# Patient Record
Sex: Male | Born: 1970 | Race: White | Hispanic: No | Marital: Married | Smoking: Never smoker
Health system: Southern US, Community
[De-identification: ages and names within clinical notes are randomized; demographics above are authoritative.]

## PROBLEM LIST (undated history)

## (undated) DIAGNOSIS — N62 Hypertrophy of breast: Secondary | ICD-10-CM

## (undated) DIAGNOSIS — I10 Essential (primary) hypertension: Secondary | ICD-10-CM

## (undated) DIAGNOSIS — Z87448 Personal history of other diseases of urinary system: Secondary | ICD-10-CM

## (undated) DIAGNOSIS — L309 Dermatitis, unspecified: Secondary | ICD-10-CM

## (undated) DIAGNOSIS — N529 Male erectile dysfunction, unspecified: Secondary | ICD-10-CM

## (undated) DIAGNOSIS — E785 Hyperlipidemia, unspecified: Secondary | ICD-10-CM

## (undated) DIAGNOSIS — M109 Gout, unspecified: Secondary | ICD-10-CM

## (undated) DIAGNOSIS — R0683 Snoring: Secondary | ICD-10-CM

## (undated) DIAGNOSIS — Q613 Polycystic kidney, unspecified: Secondary | ICD-10-CM

## (undated) HISTORY — PX: NO PAST SURGERIES: SHX2092

## (undated) HISTORY — DX: Dermatitis, unspecified: L30.9

## (undated) HISTORY — DX: Hyperlipidemia, unspecified: E78.5

## (undated) HISTORY — DX: Polycystic kidney, unspecified: Q61.3

## (undated) HISTORY — DX: Hypertrophy of breast: N62

## (undated) HISTORY — DX: Personal history of other diseases of urinary system: Z87.448

## (undated) HISTORY — DX: Gout, unspecified: M10.9

## (undated) HISTORY — DX: Snoring: R06.83

## (undated) HISTORY — DX: Essential (primary) hypertension: I10

## (undated) HISTORY — DX: Male erectile dysfunction, unspecified: N52.9

---

## 2001-07-08 ENCOUNTER — Encounter: Payer: Self-pay | Admitting: Internal Medicine

## 2001-07-08 ENCOUNTER — Emergency Department (HOSPITAL_COMMUNITY): Admission: EM | Admit: 2001-07-08 | Discharge: 2001-07-08 | Payer: Self-pay | Admitting: Internal Medicine

## 2004-08-27 ENCOUNTER — Emergency Department: Payer: Self-pay | Admitting: Emergency Medicine

## 2005-11-26 ENCOUNTER — Emergency Department (HOSPITAL_COMMUNITY): Admission: EM | Admit: 2005-11-26 | Discharge: 2005-11-26 | Payer: Self-pay | Admitting: Emergency Medicine

## 2007-09-30 ENCOUNTER — Emergency Department: Payer: Self-pay | Admitting: Emergency Medicine

## 2009-04-04 ENCOUNTER — Emergency Department: Payer: Self-pay | Admitting: Emergency Medicine

## 2011-09-15 ENCOUNTER — Inpatient Hospital Stay: Payer: Self-pay | Admitting: Internal Medicine

## 2011-09-15 DIAGNOSIS — R002 Palpitations: Secondary | ICD-10-CM

## 2011-09-15 DIAGNOSIS — R0602 Shortness of breath: Secondary | ICD-10-CM

## 2011-09-15 DIAGNOSIS — R079 Chest pain, unspecified: Secondary | ICD-10-CM

## 2011-09-15 LAB — APTT: Activated PTT: 30.4 secs (ref 23.6–35.9)

## 2011-09-15 LAB — DRUG SCREEN, URINE
Amphetamines, Ur Screen: NEGATIVE (ref ?–1000)
Barbiturates, Ur Screen: NEGATIVE (ref ?–200)
Benzodiazepine, Ur Scrn: NEGATIVE (ref ?–200)
Cocaine Metabolite,Ur ~~LOC~~: NEGATIVE (ref ?–300)
MDMA (Ecstasy)Ur Screen: NEGATIVE (ref ?–500)
Opiate, Ur Screen: POSITIVE (ref ?–300)
Tricyclic, Ur Screen: NEGATIVE (ref ?–1000)

## 2011-09-15 LAB — CK TOTAL AND CKMB (NOT AT ARMC)
CK, Total: 226 U/L (ref 35–232)
CK, Total: 175 U/L (ref 35–232)
CK-MB: 2 ng/mL (ref 0.5–3.6)
CK-MB: 2.6 ng/mL (ref 0.5–3.6)

## 2011-09-15 LAB — TSH: Thyroid Stimulating Horm: 0.774 u[IU]/mL

## 2011-09-15 LAB — CBC
HCT: 46.9 % (ref 40.0–52.0)
HGB: 15.5 g/dL (ref 13.0–18.0)
MCH: 28 pg (ref 26.0–34.0)
MCHC: 33 g/dL (ref 32.0–36.0)
MCV: 85 fL (ref 80–100)
Platelet: 249 10*3/uL (ref 150–440)
RBC: 5.52 10*6/uL (ref 4.40–5.90)
RDW: 13.5 % (ref 11.5–14.5)
WBC: 13.6 10*3/uL — ABNORMAL HIGH (ref 3.8–10.6)

## 2011-09-15 LAB — COMPREHENSIVE METABOLIC PANEL
Albumin: 4.5 g/dL (ref 3.4–5.0)
Alkaline Phosphatase: 106 U/L (ref 50–136)
Anion Gap: 10 (ref 7–16)
BUN: 18 mg/dL (ref 7–18)
Bilirubin,Total: 0.7 mg/dL (ref 0.2–1.0)
Calcium, Total: 9.7 mg/dL (ref 8.5–10.1)
Chloride: 106 mmol/L (ref 98–107)
Co2: 25 mmol/L (ref 21–32)
Creatinine: 0.99 mg/dL (ref 0.60–1.30)
EGFR (African American): >60
EGFR (Non-African Amer.): >60
Glucose: 99 mg/dL (ref 65–99)
Osmolality: 283 (ref 275–301)
Potassium: 3.7 mmol/L (ref 3.5–5.1)
SGOT(AST): 36 U/L (ref 15–37)
SGPT (ALT): 47 U/L
Sodium: 141 mmol/L (ref 136–145)
Total Protein: 8.3 g/dL — ABNORMAL HIGH (ref 6.4–8.2)

## 2011-09-15 LAB — TROPONIN I
Troponin-I: 0.11 ng/mL — ABNORMAL HIGH
Troponin-I: 0.2 ng/mL — ABNORMAL HIGH

## 2011-09-15 LAB — PROTIME-INR
INR: 1
Prothrombin Time: 13.4 secs (ref 11.5–14.7)

## 2011-09-16 DIAGNOSIS — I517 Cardiomegaly: Secondary | ICD-10-CM

## 2011-09-16 DIAGNOSIS — I471 Supraventricular tachycardia: Secondary | ICD-10-CM

## 2011-09-16 LAB — CBC WITH DIFFERENTIAL/PLATELET
Basophil #: 0.1 10*3/uL (ref 0.0–0.1)
Lymphocyte #: 3 10*3/uL (ref 1.0–3.6)
MCH: 29.1 pg (ref 26.0–34.0)
MCHC: 33.8 g/dL (ref 32.0–36.0)
MCV: 86 fL (ref 80–100)
Monocyte %: 12.7 %
Neutrophil #: 3.5 10*3/uL (ref 1.4–6.5)
Neutrophil %: 44.6 %
RBC: 5.12 10*6/uL (ref 4.40–5.90)

## 2011-09-16 LAB — BASIC METABOLIC PANEL
Anion Gap: 8 (ref 7–16)
BUN: 22 mg/dL — ABNORMAL HIGH (ref 7–18)
Calcium, Total: 8.9 mg/dL (ref 8.5–10.1)
Chloride: 105 mmol/L (ref 98–107)
Co2: 30 mmol/L (ref 21–32)
EGFR (African American): >60
Osmolality: 288 (ref 275–301)
Potassium: 4 mmol/L (ref 3.5–5.1)

## 2011-09-16 LAB — LIPID PANEL
Triglycerides: 156 mg/dL (ref 0–200)
VLDL Cholesterol, Calc: 31 mg/dL (ref 5–40)

## 2011-09-16 LAB — CK TOTAL AND CKMB (NOT AT ARMC)
CK, Total: 152 U/L (ref 35–232)
CK-MB: 2.4 ng/mL (ref 0.5–3.6)

## 2014-08-06 ENCOUNTER — Ambulatory Visit
Admit: 2014-08-06 | Disposition: A | Discharge status: Home / Self Care | Payer: Self-pay | Attending: Family Medicine | Admitting: Family Medicine

## 2014-08-08 ENCOUNTER — Other Ambulatory Visit: Payer: Self-pay | Admitting: Family Medicine

## 2014-08-08 DIAGNOSIS — M549 Dorsalgia, unspecified: Secondary | ICD-10-CM

## 2014-08-10 NOTE — H&P (Signed)
PATIENT NAME:  David [name], David [name] MR#:  [id number] DATE OF BIRTH:  [birth year]  DATE OF ADMISSION:  09/15/2011  PRIMARY CARE PHYSICIAN: [facility]   CHIEF COMPLAINT: Chest pain, palpitations.   HISTORY OF PRESENT ILLNESS: The patient is a 44 year old white male with history of hypertension, no other history, who was at work today and started having left-sided chest pain associated with palpitations along with shortness of breath. EMS was called. When they brought him they noticed he was in supraventricular tachycardia. The patient came to the ED and had to receive initially 6 mg of adenosine followed by another 12 mg of adenosine as well as metoprolol IV. He subsequently converted from likely supraventricular tachycardia to normal sinus rhythm. He was seen by cardiology, Dr. Fletcher Anon. The patient reports that currently his chest pain is resolved. He describes it as a sharp type of pain on the left side of his chest when his heart started beating fast. It now has resolved. Currently he complains of a headache. He denies any previous history of having chest pain such as this in the past. He denies any palpitations. No syncope. He denies any dyspnea on exertion. No lower extremity swelling.   PAST MEDICAL HISTORY:  1. Hypertension.  2. Gastroesophageal reflux disease.   PAST SURGICAL HISTORY: None.   ALLERGIES: None.   CURRENT MEDICATIONS:  1. Probiotic daily.  2. Multivitamin 1 tab p.o. daily.  3. Hydrochlorothiazide/lisinopril 25/20 daily.  4. Dexilant 30 mg daily.  5. Aspirin 81 mg 1 tab p.o. daily.   SOCIAL HISTORY: Does not smoke. Does not drink. No drugs.   FAMILY HISTORY: Father had heart disease since age 55 with chronic palpitations. According to his mother he died in his 54s. Exact nature of his heart disease is not known.     REVIEW OF SYSTEMS: CONSTITUTIONAL: Denies any fevers. Currently complains of fatigue, weakness. No current pain except headache. No weight loss. No  weight gain. EYES: No blurred or double vision. No redness. No inflammation. No glaucoma. No cataracts. ENT: No tinnitus. No ear pain. No hearing loss. No seasonal or year-round allergies. RESPIRATORY: No cough. No wheezing. No hemoptysis. No chronic obstructive pulmonary disease. No tuberculosis. CARDIOVASCULAR: Chest pain as above. No orthopnea. No edema.  Arrhythmia as above. No syncope. Has high blood pressure. GI: No nausea, vomiting, or diarrhea. No abdominal pain. No hematemesis. No melena. No gastroesophageal reflux disease. No irritable bowel syndrome. No jaundice. GU: Denies any dysuria, hematuria, renal calculus, or frequency. ENDO: Denies any polyuria, nocturia, or thyroid problems. HEME/LYMPH: Denies anemia, easy bruisability, or bleeding. SKIN: No acne. No rash. No change in mole, hair, or skin.  MUSCULOSKELETAL: No pain in the neck, back, or shoulder. NEURO: No numbness. No cerebrovascular accident. No transient ischemic attack. PSYCHIATRIC: No anxiety. No insomnia. No ADD. No OCD.   PHYSICAL EXAMINATION:  VITAL SIGNS: Temperature 95.8, pulse currently 74, respirations 18, blood pressure 122/60,  O2 98%.   GENERAL: The patient is a well-developed, well-nourished Caucasian male in no acute distress.   HEENT: Head atraumatic, normocephalic. Pupils equally round, reactive to light and accommodation. Extraocular movements intact. There is no conjunctival pallor. No scleral icterus. Nasal exam shows no drainage or ulceration.  Oropharynx is clear without any exudates.   NECK: No thyromegaly. No carotid bruits.   CARDIOVASCULAR: Regular rate and rhythm. No murmurs, rubs, clicks, or gallops. PMI is not displaced.   LUNGS: Clear to auscultation bilaterally without any rales, rhonchi, or wheezing.   ABDOMEN: Soft,  nontender, nondistended. Positive bowel sounds times four.   EXTREMITIES: No clubbing, cyanosis, or edema.   SKIN: No rash.   VASCULAR: Good DP, PT pulses.   NEUROLOGICAL:  Awake, alert, oriented times three. No focal deficits.   PSYCHIATRIC: Not anxious or depressed.   LABORATORY, DIAGNOSTIC, AND RADIOLOGICAL DATA: Glucose 99, BUN 18, creatinine 0.99, sodium 141, potassium 3.7, chloride 106, CO2 25. His total protein is 8.3. Albumin 4.5, bilirubin total 0.7. Troponin 0.11. CK-MB is 2. WBC 13.6, hemoglobin 15.4, platelet count 249. Chest x-ray shows no acute abnormality.   ASSESSMENT AND PLAN: The patient is a 44 year old white male with history of hypertension who presents with acute onset of chest pain and palpitations at work noted to be in supraventricular tachycardia. He received adenosine and has been seen by cardiology here. 1. Chest pain, likely related supraventricular tachycardia, now resolved: Check serial enzymes. We will place him on aspirin. Per cardiology they will follow his enzymes.  If his troponin goes up then he may need cath. Otherwise he will need to get a stress test. Cardiology to decide on either one. We will also check a fasting lipid panel in the a.m.  2. Supraventricular tachycardia: Monitor on telemetry. I am going to go ahead and place him on p.o. metoprolol.  3. Hypertension: We will hold lisinopril, hydrochlorothiazide for now. Instead I will metoprolol in light of his supraventricular tachycardia.  4. Miscellaneous: We will place him on Lovenox for deep vein thrombosis prophylaxis.    TIME SPENT: 35 minutes spent.  ____________________________ Lafonda Mosses. Posey Pronto, MD shp:bjt D: 09/15/2011 16:01:16 ET T: 09/15/2011 16:51:18 ET JOB#: [id number]  cc: Saprina Chuong H. Posey Pronto, MD, <Dictator> [place] MD ELECTRONICALLY SIGNED 09/16/2011 14:02

## 2014-08-10 NOTE — Consult Note (Signed)
Brief Consult Note: Diagnosis: SVT, chest pain with ST depression.   Patient was seen by consultant.   Discussed with Attending MD.   Comments: sudden onset of chest pain ,dyspnea and palpitations.  ECG showed possible SVT. He was very uncomfortable with dyspnea.  He was given 6 mg of IV Adenosine but did not respond. 12 mg of IV Adenosine was given. He converted to sinus tachycardia with rate of 110. Metoporlol 5 mg IV was given.  Recommend: Admit to tele, check labs and serial cardiac enzymes.  Echo.  Stress test vs cardiac cath depending on labs results.  Electronic Signatures: Kathlyn Sacramento (MD)  (Signed [phone] 13:57)  Authored: Brief Consult Note   Last Updated: 30-May-13 13:57 by Kathlyn Sacramento (MD)

## 2014-08-10 NOTE — Discharge Summary (Signed)
PATIENT NAME:  David [name], David [name] MR#:  [id number] DATE OF BIRTH:  Aug 10, 1970  DATE OF ADMISSION:  09/15/2011 DATE OF DISCHARGE:  09/16/2011  ADMITTING PHYSICIAN: Dustin Flock, MD   DISCHARGING PHYSICIAN: Gladstone Lighter, MD   PRIMARY MD: [place]: Cardiology consultation by Dr. Fletcher Anon    DISCHARGE DIAGNOSES:  1. Chest pain, likely angina.  2. Supraventricular tachycardia, converted back to normal sinus rhythm to sinus bradycardia.  3. Elevated troponin secondary to demand ischemia.  4. Hypertension.  5. Gastroesophageal reflux disease.   DISCHARGE HOME MEDICATIONS: 1. Metoprolol 25 mg p.o. b.i.d.  2. Probiotic oral capsule daily.  3. Multivitamin 1 tablet p.o. daily.  4. Aspirin 81 mg p.o. daily.  5. Dexilant 30 mg p.o. daily.   DISCHARGE DIET: Low sodium diet.   DISCHARGE ACTIVITY: As tolerated.    FOLLOW-UP INSTRUCTIONS:  1. PCP follow-up in 1 to 2 weeks.  2. Follow-up with Dr. Fletcher Anon in two weeks.   LABS AND IMAGING STUDIES PRIOR TO DISCHARGE: WBC 7.9, hemoglobin 14.9, hematocrit 44.1, platelet count 210, sodium 143, potassium 4.0, chloride 105, bicarb 30, BUN 22, creatinine 1.09, glucose 89, calcium 8.9. LDL 88, HDL 27, total cholesterol 146, triglycerides 156. Troponin 0.12. Urine tox screen positive for opiates while in the hospital.   Chest x-ray on admission showing clear lung fields. No acute cardiothoracic changes.   ALT 47, AST 36, alkaline phosphatase 106, total bilirubin 0.7, albumin 4.5. INR is 1.0. D-dimer is less than 0.22. TSH 0.774.   Echo Doppler showing normal LV systolic function, EF greater than 55%.   Exercise Myoview showed normal study. No evidence of ischemia or infarct. Normal LV systolic function greater than 73%.   BRIEF HOSPITAL COURSE: [name] is a 44 year old male with history of hypertension who was at work and presented with severe left-sided chest pain of sudden onset associated with  palpitations and dyspnea. He was found to be in supraventricular tachycardia on admission and received 6 mg of adenosine followed by another 12 mg of adenosine followed by IV metoprolol. He subsequently converted to normal sinus rhythm and was admitted for his chest pain to rule out ischemic causes. He did receive an oral dose of 50 mg of metoprolol the night of admission and remained sinus brady with heart rate in the upper 40's to 50's. Dr. Fletcher Anon has seen the patient while in the hospital and suggested since the cardiac enzymes are steady and trending down probably secondary to demand ischemia and elevated heart rate. He suggested a stress test. If the cardiac enzymes were doubling then probably cardiac cath would have been done. However, the patient remained chest pain free while in the hospital and had an exercise stress test done which was negative for any infarct or ischemia. His metoprolol dose has been decreased to 25 b.i.d. for sinus bradycardia developed while in the hospital and he will follow-up with Dr. Fletcher Anon as an outpatient. If there is a recurrence of supraventricular tachycardia, Dr. Fletcher Anon recommended ablation because of his younger age. He was on a different blood pressure medication prior to admission which is being stopped as his blood pressure is well controlled with metoprolol alone at this time.   DISCHARGE CONDITION: Stable.   DISCHARGE DISPOSITION: Home.      TIME SPENT ON DISCHARGE: 45 minutes. His course has been otherwise uneventful in the hospital.   ____________________________ Gladstone Lighter, MD rk:drc D: 09/19/2011 10:46:42 ET T: 09/19/2011 11:26:45 ET JOB#: [id number]  cc: Gladstone Lighter, MD, <Dictator> [facility] A. Fletcher Anon, MD Gladstone Lighter MD ELECTRONICALLY SIGNED 09/20/2011 19:30

## 2014-08-10 NOTE — Consult Note (Signed)
General Aspect The patient is a 44 year old white male with history of hypertension, no other history, who was at work today and started having left-sided chest pain associated with palpitations along with shortness of breath. EMS was called. When they brought him he was in significant distress with significant dyspnea, dizziness and tachycardia. His initial heart rate was 135 beats per minute. I was consulted by Dr. Thomasene Lot due to tachycardia and ST depression on his ECG. When I went to see him, his heart rate was 155 beats per minute. It was highly suspicious for supraventricular tachycardia. He was connected to the ECG machine and given initially 6 mg of adenosine but did not respond to that. He was then given 12 mg and converted to sinus rhythm. He was given IV metoprolol 5 mg as well. He denies any previous similar symptoms. he had significant chest tightness with the episode. He had previous chest pain in the past with family history of coronary artery disease. He was seen by Dr. Humphrey Rolls last year and according to the patient he had a stress test which was unremarkable. He was told about possible arrhythmia but the details are not available.   Physical Exam:   GEN no acute distress    NECK supple    RESP normal resp effort  clear BS    CARD Regular rate and rhythm  No murmur    ABD denies tenderness    LYMPH negative neck    EXTR negative cyanosis/clubbing    SKIN normal to palpation    NEURO cranial nerves intact    PSYCH alert, A+O to time, place, person   Review of Systems:   Subjective/Chief Complaint palpitations, dyspnea and chest pain    General: No Complaints    Skin: No Complaints    ENT: No Complaints    Eyes: No Complaints    Neck: No Complaints    Respiratory: Short of breath    Cardiovascular: Chest pain or discomfort  Tightness  Palpitations  Dyspnea    Vascular: No Complaints    Musculoskeletal: No Complaints    Neurologic: No Complaints     Hematologic: No Complaints    Endocrine: No Complaints    Psychiatric: No Complaints    Review of Systems: All other systems were reviewed and found to be negative   Home Medications: Medication Instructions Status  hydrochlorothiazide-lisinopril 25 mg-20 mg oral tablet 1 tab(s) orally once a day Active  Dexilant 30 mg oral delayed release capsule 1 cap(s) orally once a day Active  aspirin 81 mg oral tablet 1 tab(s) orally once a day Active  multivitamin 1 tab(s) orally once a day Active  Probiotic Formula oral capsule 1 cap(s) orally once a day Active   Cardiac:  30-May-13 13:17    Troponin I 0.11   CK, Total 226   CPK-MB, Serum 2.0  Routine Hem:  30-May-13 13:17    WBC (CBC) 13.6   RBC (CBC) 5.52   Hemoglobin (CBC) 15.5   Hematocrit (CBC) 46.9   Platelet Count (CBC) 249   MCV 85   MCH 28.0   MCHC 33.0   RDW 13.5  Routine Chem:  30-May-13 13:17    Glucose, Serum 99   BUN 18   Creatinine (comp) 0.99   Sodium, Serum 141   Potassium, Serum 3.7   Chloride, Serum 106   CO2, Serum 25   Calcium (Total), Serum 9.7  Hepatic:  30-May-13 13:17    Bilirubin, Total 0.7   Alkaline  Phosphatase 106   SGPT (ALT) 47   SGOT (AST) 36   Total Protein, Serum 8.3   Albumin, Serum 4.5  Routine Chem:  30-May-13 13:17    Osmolality (calc) 283   eGFR (African American) >60   eGFR (Non-African American) >60   Anion Gap 10  Routine Coag:  30-May-13 13:17    Prothrombin 13.4   INR 1.0   Activated PTT (APTT) 30.4   D-Dimer, Quantitative < 0.22  Thyroid:  30-May-13 13:17    Thyroid Stimulating Hormone 0.774   EKG:   EKG Interp. by me    Abnormal RBBB  supraventricular tachycardia. Minor ST depression in the inferior leads   Radiology Results: XRay:    30-May-13 13:23, Chest Portable Single View   Chest Portable Single View    REASON FOR EXAM:    Chest Pain  COMMENTS:       PROCEDURE: DXR - DXR PORTABLE CHEST SINGLE VIEW  - Sep 15 2011  1:23PM     RESULT: The  inspiratory level is less than optimal. There is haziness at   both lung bases compatible with the lack of deep inspiration. No   pneumonia, pneumothorax or pleural effusion is seen. The heart size is   normal. Monitoring electrodes are present.    IMPRESSION: No acute changes are identified.    Dictation Site: 2     Thank you for this opportunity to contribute tothe care of your patient.     Verified By: Dionne Ano WALL, M.D., MD    No Known Allergies:   Vital Signs/Nurse's Notes: **Vital Signs.:   30-May-13 17:36   Vital Signs Type Admission   Temperature Temperature (F) 98.8   Celsius 37.1   Temperature Source oral   Pulse Pulse 72   Pulse source per Dinamap   Respirations Respirations 20   Systolic BP Systolic BP 168   Diastolic BP (mmHg) Diastolic BP (mmHg) 60   Mean BP 78   BP Source vital sign device   Pulse Ox % Pulse Ox % 98   Pulse Ox Activity Level  At rest   Oxygen Delivery 2L     Impression 1. paroxysmal supraventricular tachycardia: He converted to normal sinus rhythm with IV adenosine. I recommend oral metoprolol. It appears that this is his first episode. I agree with checking thyroid function and urine toxicology screen. I will request an echocardiogram.  Given his young age, an ablation can be considered if he develops recurrent episodes.  2. Chest pain with ST depression: Likely supply demand ischemia. However, the patient does have family history of coronary artery disease and hypertension. Continue serial cardiac enzymes. Treat with aspirin for now. If his cardiac enzymes trend up, will proceed with cardiac catheterization. However, if that's not the case, a cardiac stress test will be performed.   Electronic Signatures: Kathlyn Sacramento (MD)  (Signed [phone] 19:41)  Authored: General Aspect/Present Illness, History and Physical Exam, Review of System, Home Medications, Labs, EKG , Radiology, Allergies, Vital Signs/Nurse's Notes, Impression/Plan   Last  Updated: 30-May-13 19:41 by Kathlyn Sacramento (MD)

## 2014-08-18 ENCOUNTER — Ambulatory Visit
Admission: RE | Admit: 2014-08-18 | Discharge: 2014-08-18 | Disposition: A | Discharge status: Home / Self Care | Payer: BLUE CROSS/BLUE SHIELD | Source: Ambulatory Visit | Attending: Family Medicine | Admitting: Family Medicine

## 2014-08-18 DIAGNOSIS — M5126 Other intervertebral disc displacement, lumbar region: Secondary | ICD-10-CM | POA: Insufficient documentation

## 2014-08-18 DIAGNOSIS — M549 Dorsalgia, unspecified: Secondary | ICD-10-CM

## 2014-08-18 DIAGNOSIS — M5386 Other specified dorsopathies, lumbar region: Secondary | ICD-10-CM | POA: Diagnosis not present

## 2014-08-18 DIAGNOSIS — M545 Low back pain: Secondary | ICD-10-CM | POA: Diagnosis present

## 2014-10-17 ENCOUNTER — Other Ambulatory Visit: Payer: Self-pay | Admitting: Family Medicine

## 2014-10-17 NOTE — Telephone Encounter (Signed)
Pts wife called in and says pharmacy sent over fax twice and yet medication has not been refilled. Pts wife feels as if the pharmacy wasn't moving fast enough and would like for [place] to send over refill over to the pharmacy if at all possible.    Losartan potassium 100mg  to [facility] on [address].

## 2014-10-21 MED ORDER — LOSARTAN POTASSIUM 100 MG PO TABS: 100.0000 mg | ORAL_TABLET | Freq: Every day | ORAL | Status: DC

## 2014-10-21 NOTE — Telephone Encounter (Signed)
Patient notified, appt scheduled.

## 2014-10-21 NOTE — Telephone Encounter (Signed)
Please contact wife (if she is in fact approved on his release of information form, I can't find it, FYI is blank) Let her know I'm worried that he's not being compliant with his visits and medicine I prescribed 30 days of losartan in April and he should have run out of it in late May (it's now July); he isn't taking this regularly it appears Also, he was seen in mid-May and a new medicine was started and he was supposed to come back two weeks later (end of May) We would like to see him to recheck his BP but we'd encourage them to come up with a way for him to remember to take his pills every single day Adjusting medicine and getting blood pressure under control is especially difficult with sporadic dosing Please schedule him to come in to be seen in the next 2 weeks and have him bring in all his pill bottles We are not fussing; we are here to help and problem-solve and get his blood pressure controlled Thank you

## 2014-10-31 DIAGNOSIS — M109 Gout, unspecified: Secondary | ICD-10-CM | POA: Insufficient documentation

## 2014-10-31 DIAGNOSIS — E785 Hyperlipidemia, unspecified: Secondary | ICD-10-CM | POA: Insufficient documentation

## 2014-10-31 DIAGNOSIS — I10 Essential (primary) hypertension: Secondary | ICD-10-CM | POA: Insufficient documentation

## 2014-10-31 DIAGNOSIS — R0683 Snoring: Secondary | ICD-10-CM

## 2014-10-31 DIAGNOSIS — L309 Dermatitis, unspecified: Secondary | ICD-10-CM | POA: Insufficient documentation

## 2014-10-31 DIAGNOSIS — N62 Hypertrophy of breast: Secondary | ICD-10-CM | POA: Insufficient documentation

## 2014-11-04 ENCOUNTER — Ambulatory Visit (INDEPENDENT_AMBULATORY_CARE_PROVIDER_SITE_OTHER): Payer: BLUE CROSS/BLUE SHIELD | Admitting: Family Medicine

## 2014-11-04 ENCOUNTER — Encounter: Payer: Self-pay | Admitting: Family Medicine

## 2014-11-04 VITALS — BP 144/94 | HR 63 | Temp 97.5°F | Wt 217.0 lb

## 2014-11-04 DIAGNOSIS — I10 Essential (primary) hypertension: Secondary | ICD-10-CM | POA: Diagnosis not present

## 2014-11-04 DIAGNOSIS — E786 Lipoprotein deficiency: Secondary | ICD-10-CM

## 2014-11-04 DIAGNOSIS — M109 Gout, unspecified: Secondary | ICD-10-CM

## 2014-11-04 DIAGNOSIS — E785 Hyperlipidemia, unspecified: Secondary | ICD-10-CM

## 2014-11-04 MED ORDER — METOPROLOL SUCCINATE ER 25 MG PO TB24: 25.0000 mg | ORAL_TABLET | Freq: Every day | ORAL | Status: DC

## 2014-11-04 MED ORDER — METOPROLOL SUCCINATE ER 25 MG PO TB24: 37.5000 mg | ORAL_TABLET | Freq: Every day | ORAL | Status: DC

## 2014-11-04 MED ORDER — AMLODIPINE BESYLATE 10 MG PO TABS: 10.0000 mg | ORAL_TABLET | Freq: Every day | ORAL | Status: DC

## 2014-11-04 MED ORDER — LOSARTAN POTASSIUM 100 MG PO TABS: 100.0000 mg | ORAL_TABLET | Freq: Every day | ORAL | Status: DC

## 2014-11-04 NOTE — Patient Instructions (Addendum)
Do NOT use salt substitutes; if you have to use anything at all, use just a little bit of the real thing Salt substitutes plus your losartan can cause a dangerous build-up of potassium in the body Limit the processed pork and saturated Try turmeric as a natural anti-inflammatory (for pain and arthritis). It comes in capsules where you buy aspirin and fish oil, but also as a spice where you buy pepper and garlic powder. If you need something for aches or pains, try to use Tylenol (acetaminphen) instead of non-steroidals (which include Aleve, ibuprofen, Advil, Motrin, and naproxen); non-steroidals can cause long-term kidney damage Okay to have one glass of wine or beer every day; modest alcohol does help blood pressure; too much alcohol can actually be harmful; do not drink more than 14 drinks per week Try to use PLAIN allergy medicine without the decongestant Avoid: phenylephrine, phenylpropanolamine, and pseudoephredine Try to lose five more pounds Continue your pills at the same dose and I am hopeful that with stopping the ibuprofen and losing five more pounds that you'll get to goal (under 140/90) Monitor your blood pressure and home a few days per week Return to see Amy for BP check and labs in 3 weeks  DASH Eating Plan DASH stands for "Dietary Approaches to Stop Hypertension." The DASH eating plan is a healthy eating plan that has been shown to reduce high blood pressure (hypertension). Additional health benefits may include reducing the risk of type 2 diabetes mellitus, heart disease, and stroke. The DASH eating plan may also help with weight loss. WHAT DO I NEED TO KNOW ABOUT THE DASH EATING PLAN? For the DASH eating plan, you will follow these general guidelines:  Choose foods with a percent daily value for sodium of less than 5% (as listed on the food label).  Use salt-free seasonings or herbs instead of table salt or sea salt.  Check with your health care provider or pharmacist before  using salt substitutes.  Eat lower-sodium products, often labeled as "lower sodium" or "no salt added."  Eat fresh foods.  Eat more vegetables, fruits, and low-fat dairy products.  Choose whole grains. Look for the word "whole" as the first word in the ingredient list.  Choose fish and skinless chicken or [place] more often than red meat. Limit fish, poultry, and meat to 6 oz (170 g) each day.  Limit sweets, desserts, sugars, and sugary drinks.  Choose heart-healthy fats.  Limit cheese to 1 oz (28 g) per day.  Eat more home-cooked food and less restaurant, buffet, and fast food.  Limit fried foods.  Cook foods using methods other than frying.  Limit canned vegetables. If you do use them, rinse them well to decrease the sodium.  When eating at a restaurant, ask that your food be prepared with less salt, or no salt if possible. WHAT FOODS CAN I EAT? Seek help from a dietitian for individual calorie needs. Grains Whole grain or whole wheat bread. Brown rice. Whole grain or whole wheat pasta. Quinoa, bulgur, and whole grain cereals. Low-sodium cereals. Corn or whole wheat flour tortillas. Whole grain cornbread. Whole grain crackers. Low-sodium crackers. Vegetables Fresh or frozen vegetables (raw, steamed, roasted, or grilled). Low-sodium or reduced-sodium tomato and vegetable juices. Low-sodium or reduced-sodium tomato sauce and paste. Low-sodium or reduced-sodium canned vegetables.  Fruits All fresh, canned (in natural juice), or frozen fruits. Meat and Other Protein Products Ground beef (85% or leaner), grass-fed beef, or beef trimmed of fat. Skinless chicken or [place]. Ground  chicken or [place]. Pork trimmed of fat. All fish and seafood. Eggs. Dried beans, peas, or lentils. Unsalted nuts and seeds. Unsalted canned beans. Dairy Low-fat dairy products, such as skim or 1% milk, 2% or reduced-fat cheeses, low-fat ricotta or cottage cheese, or plain low-fat yogurt. Low-sodium or  reduced-sodium cheeses. Fats and Oils Tub margarines without trans fats. Light or reduced-fat mayonnaise and salad dressings (reduced sodium). Avocado. Safflower, olive, or canola oils. Natural peanut or almond butter. Other Unsalted popcorn and pretzels. The items listed above may not be a complete list of recommended foods or beverages. Contact your dietitian for more options. WHAT FOODS ARE NOT RECOMMENDED? Grains White bread. White pasta. White rice. Refined cornbread. Bagels and croissants. Crackers that contain trans fat. Vegetables Creamed or fried vegetables. Vegetables in a cheese sauce. Regular canned vegetables. Regular canned tomato sauce and paste. Regular tomato and vegetable juices. Fruits Dried fruits. Canned fruit in light or heavy syrup. Fruit juice. Meat and Other Protein Products Fatty cuts of meat. Ribs, chicken wings, bacon, sausage, bologna, salami, chitterlings, fatback, hot dogs, bratwurst, and packaged luncheon meats. Salted nuts and seeds. Canned beans with salt. Dairy Whole or 2% milk, cream, half-and-half, and cream cheese. Whole-fat or sweetened yogurt. Full-fat cheeses or blue cheese. Nondairy creamers and whipped toppings. Processed cheese, cheese spreads, or cheese curds. Condiments Onion and garlic salt, seasoned salt, table salt, and sea salt. Canned and packaged gravies. Worcestershire sauce. Tartar sauce. Barbecue sauce. Teriyaki sauce. Soy sauce, including reduced sodium. Steak sauce. Fish sauce. Oyster sauce. Cocktail sauce. Horseradish. Ketchup and mustard. Meat flavorings and tenderizers. Bouillon cubes. Hot sauce. Tabasco sauce. Marinades. Taco seasonings. Relishes. Fats and Oils Butter, stick margarine, lard, shortening, ghee, and bacon fat. Coconut, palm kernel, or palm oils. Regular salad dressings. Other Pickles and olives. Salted popcorn and pretzels. The items listed above may not be a complete list of foods and beverages to avoid. Contact your  dietitian for more information. WHERE CAN I FIND MORE INFORMATION? National Heart, Lung, and Blood Institute: travelstabloid.com Document Released: 03/24/2011 Document Revised: 08/19/2013 Document Reviewed: 02/06/2013 [facility] Patient Information 2015 [place], [place]. This information is not intended to replace advice given to you by your health care provider. Make sure you discuss any questions you have with your health care provider.

## 2014-11-04 NOTE — Assessment & Plan Note (Signed)
We're avoiding HCTZ because of his gout

## 2014-11-04 NOTE — Assessment & Plan Note (Signed)
Recheck lipids at f/u in a few weeks

## 2014-11-04 NOTE — Progress Notes (Signed)
BP 144/94 mmHg  Pulse 63  Temp(Src) 97.5 F (36.4 C)  Wt 217 lb (98.431 kg)  SpO2 98%   Subjective:    Patient ID: David [name], male    DOB: 1971-03-12, 44 y.o.   MRN: [id number]  HPI: David [name] is a 44 y.o. male  Chief Complaint  Patient presents with  . Hypertension   Hypertension Checking blood pressure away from here?  yes Range (low to high) over last two weeks:  140s over 90s Ran out of the third pill a while ago, had been in a day timer; crazy time of year  Hypertension-associated complications:  none  If taking medicines, are you taking them regularly?  NO but ran out  Siblings / family history: Does high blood pressure run in your family?   YES  Salt:  Trying to limit sodium / salt when buying foods at the grocery store?  yes, wife does the shopping Do you try to limit added salt when cooking and at the table?  yes  Sweets/licorice:  Do you eat a lot of sweets or eat black licorice?  no  Saturated fats: Do you eat a lot of foods like bacon, sausage, pepperoni, cheeseburgers, hot dogs, bologna, and cheese?  YES  Sedentary lifestyle:  Exercise/activity level:  frequent (three or more times per week)  Steroids/Non-steroidals:  Have you had a recent cortisone shot in the last few months?  no  Do you take prednisone or prescription NSAIDs or take OTCS NSAIDs such as ibuprofen, Motrin, Advil, Aleve, or naproxen? YES taking 800 mg of ibuprofen every morning and some later too  Smoking: Do you smoke?  no  Snoring / sleep apnea: Do you snore or have sleep apnea?  YES, he snores; he had tests for sleep apnea and he slept "good"  Stress: Do you feel like you are under excessive stress or that your stress level affects your blood pressure at times?    YES; he has been trying to be less stressed  Stroh's (alcohol): Do you drink alcohol  yes  --- if yes, do you drink more than 14 drinks/week if male or more than 7 drinks/week if male?  no  Sudafed  (decongestants): Do you use any OTC decongestant products like Allegra-D, Claritin-D, Zyrtec-D, Tylenol Cold and Sinus, etc.?  YES  He has high cholesterol; eats typical [place] farm breakfast on the weekends, does try to have cereal during the week  Relevant past medical, surgical, family and social history reviewed and updated as indicated. Interim medical history since our last visit reviewed. Allergies and medications reviewed and updated.  Review of Systems Per HPI unless specifically indicated above     Objective:    BP 144/94 mmHg  Pulse 63  Temp(Src) 97.5 F (36.4 C)  Wt 217 lb (98.431 kg)  SpO2 98%  Wt Readings from Last 3 Encounters:  11/04/14 217 lb (98.431 kg)  08/29/14 222 lb (100.699 kg)  08/18/14 225 lb (102.059 kg)    Physical Exam  Constitutional: He appears well-developed and well-nourished. No distress.  HENT:  Head: Normocephalic and atraumatic.  Eyes: EOM are normal. No scleral icterus.  Neck: No thyromegaly present.  Cardiovascular: Normal rate and regular rhythm.   Pulmonary/Chest: Effort normal and breath sounds normal.  Abdominal: Soft. Bowel sounds are normal. He exhibits no distension.  Musculoskeletal: He exhibits no edema.  Neurological: Coordination normal.  Skin: Skin is warm and dry. No pallor.  Psychiatric: He has a normal mood  and affect. His behavior is normal. Judgment and thought content normal.      Assessment & Plan:   Problem List Items Addressed This Visit      Cardiovascular and Mediastinum   Hypertension    We discussed medication changes; we decided that he'll stop the 800 mg of ibuprofen, lose five more pounds, decrease sodium in diet, and return for BP recheck in a few weeks with Amy; if BP still over 140/90, then adjust medicine further; refills sent today; also, avoid decongestants      Relevant Medications   amLODipine (NORVASC) 10 MG tablet   losartan (COZAAR) 100 MG tablet   metoprolol succinate (TOPROL-XL) 25 MG  24 hr tablet     Other   Gout    We're avoiding HCTZ because of his gout      Hyperlipidemia    Try to limit saturated fats; discussed other breakfast options besides sausage and bacon; he may consider, but he [place] farm and seemed reluctant to change      Relevant Medications   amLODipine (NORVASC) 10 MG tablet   losartan (COZAAR) 100 MG tablet   metoprolol succinate (TOPROL-XL) 25 MG 24 hr tablet   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

## 2014-11-04 NOTE — Assessment & Plan Note (Signed)
Try to limit saturated fats; discussed other breakfast options besides sausage and bacon; he may consider, but he [place] a farm and seemed reluctant to change

## 2014-11-04 NOTE — Assessment & Plan Note (Signed)
We discussed medication changes; we decided that he'll stop the 800 mg of ibuprofen, lose five more pounds, decrease sodium in diet, and return for BP recheck in a few weeks with Amy; if BP still over 140/90, then adjust medicine further; refills sent today; also, avoid decongestants

## 2015-03-09 ENCOUNTER — Encounter: Payer: Self-pay | Admitting: Family Medicine

## 2015-03-09 ENCOUNTER — Ambulatory Visit (INDEPENDENT_AMBULATORY_CARE_PROVIDER_SITE_OTHER): Payer: BLUE CROSS/BLUE SHIELD | Admitting: Family Medicine

## 2015-03-09 VITALS — BP 157/110 | HR 66 | Temp 97.8°F | Ht 72.5 in | Wt 234.0 lb

## 2015-03-09 DIAGNOSIS — B360 Pityriasis versicolor: Secondary | ICD-10-CM | POA: Diagnosis not present

## 2015-03-09 DIAGNOSIS — M109 Gout, unspecified: Secondary | ICD-10-CM | POA: Diagnosis not present

## 2015-03-09 DIAGNOSIS — E786 Lipoprotein deficiency: Secondary | ICD-10-CM

## 2015-03-09 DIAGNOSIS — Z5181 Encounter for therapeutic drug level monitoring: Secondary | ICD-10-CM

## 2015-03-09 DIAGNOSIS — I10 Essential (primary) hypertension: Secondary | ICD-10-CM | POA: Diagnosis not present

## 2015-03-09 DIAGNOSIS — E785 Hyperlipidemia, unspecified: Secondary | ICD-10-CM

## 2015-03-09 DIAGNOSIS — E663 Overweight: Secondary | ICD-10-CM | POA: Insufficient documentation

## 2015-03-09 DIAGNOSIS — E669 Obesity, unspecified: Secondary | ICD-10-CM | POA: Diagnosis not present

## 2015-03-09 MED ORDER — LOSARTAN POTASSIUM 50 MG PO TABS: 50.0000 mg | ORAL_TABLET | Freq: Every day | ORAL | Status: DC

## 2015-03-09 MED ORDER — AMLODIPINE BESYLATE 10 MG PO TABS: 10.0000 mg | ORAL_TABLET | Freq: Every day | ORAL | Status: DC

## 2015-03-09 NOTE — Assessment & Plan Note (Signed)
Will avoid HCTZ and avoid high-purine foods ([place], gravy)

## 2015-03-09 NOTE — Patient Instructions (Addendum)
Try using [employer] (or any generic selenium-containing dandruff shampoo), lather on scalp, facial hair, chest, abdomen, back, then step out of shower and let suds dry for 20 minutes, then back into the shower to thoroughly rinse off; do that twice a week for about 4-6 weeks and that should take care of the infection You can repeat this if it comes back (which is likely) Really try to work on the DASH guidelines, weight loss Return in 3 weeks for next visit (we'll do a lab then, but you don't need to be fasting) Check out the information at familydoctor.org entitled "What It Takes to Lose Weight" Try to lose between 1-2 pounds per week by taking in fewer calories and burning off more calories You can succeed by limiting portions, limiting foods dense in calories and fat, becoming more active, and drinking 8 glasses of water a day (64 ounces) Don't skip meals, especially breakfast, as skipping meals may alter your metabolism Do not use over-the-counter weight loss pills or gimmicks that claim rapid weight loss A healthy BMI (or body mass index) is between 18.5 and 24.9 You can calculate your ideal BMI at the [place] website [employer]fr

## 2015-03-09 NOTE — Assessment & Plan Note (Signed)
Check lipids today (not completely fasting, had Cheerios witih 2% milk); exercising more which is great

## 2015-03-09 NOTE — Assessment & Plan Note (Signed)
Continue to cut back on saturated fats, keep trying to lose fat/weight

## 2015-03-09 NOTE — Assessment & Plan Note (Signed)
Explained dx, treatment; may recur

## 2015-03-09 NOTE — Assessment & Plan Note (Signed)
Try DASH guidelines; start back on 50 mg losartan and 10 mg of amlodipine; will not restart the beta-blocker because that is likely what caused the fatigue; work on weight loss; so glad no more nicotine

## 2015-03-09 NOTE — Assessment & Plan Note (Signed)
Work on weight loss; see AVS; we talked about the fact that he has gained some weight; some may be muscle, but he still needs to work on weight loss, losing fat

## 2015-03-09 NOTE — Progress Notes (Signed)
BP 157/110 mmHg  Pulse 66  Temp(Src) 97.8 F (36.6 C)  Ht 6' 0.5" (1.842 m)  Wt 234 lb (106.142 kg)  BMI 31.28 kg/m2  SpO2 98%   Subjective:    Patient ID: David [name], male    DOB: [birth year], 44 y.o.   MRN: [id number]  HPI: David [name] is a 44 y.o. male  Chief Complaint  Patient presents with  . Hypertension  . Hyperlipidemia   No interim medical excitement He quit dipping; no tobacco products now at all He has hypertension; not taking his medicine as directed, actually not taking any meds at all recently; he only had one month or ARB prescribed in July so he's not been on that for months; forgets sometimes; made him tired; felt drained on the medicine; has not taken medicine in about two weeks, no medicine at all for two weeks; he quit the medicine and energy came back Exercising for the last month and a half; lots of walking, lots of push-ups He has high cholesterol; trying to cut back on salt; he can't remember last hamburger; Cheerios with 2% milk; eggs just on the weekends; during the week, healthier breakfast; he had bowl of Cheerios this mornings He has a rash on his stomach and back; been there for a while; no itching at all; he has not tried anything on it really, other than [employer] moisturizer; ends of fingers dry and crack  Relevant past medical, surgical, family and social history reviewed and updated as indicated. Interim medical history since our last visit reviewed. Allergies and medications reviewed and updated.  Review of Systems  Respiratory: Negative for shortness of breath.   Cardiovascular: Negative for chest pain and leg swelling.  Per HPI unless specifically indicated above     Objective:    BP 157/110 mmHg  Pulse 66  Temp(Src) 97.8 F (36.6 C)  Ht 6' 0.5" (1.842 m)  Wt 234 lb (106.142 kg)  BMI 31.28 kg/m2  SpO2 98%  Wt Readings from Last 3 Encounters:  03/09/15 234 lb (106.142 kg)  11/04/14 217 lb (98.431 kg)  08/29/14 222 lb (100.699 kg)     Physical Exam  Constitutional: He appears well-developed and well-nourished. No distress.  Weight gain 17 pounds since last visit; obese  HENT:  Head: Normocephalic and atraumatic.  Eyes: EOM are normal. No scleral icterus.  Neck: No thyromegaly present.  Cardiovascular: Normal rate and regular rhythm.   Pulmonary/Chest: Effort normal and breath sounds normal.  Abdominal: Soft. Bowel sounds are normal. He exhibits no distension.  Musculoskeletal: He exhibits no edema.  Neurological: Coordination normal.  Skin: Skin is warm and dry. Rash (oval lesions on the chest, abdomen, back, slight clearing, fairly well-demarcated borders; faintly erythematous, brownish; no white scale) noted. No pallor.  Psychiatric: He has a normal mood and affect. His behavior is normal. Judgment and thought content normal.      Assessment & Plan:   Problem List Items Addressed This Visit      Cardiovascular and Mediastinum   Hypertension - Primary    Try DASH guidelines; start back on 50 mg losartan and 10 mg of amlodipine; will not restart the beta-blocker because that is likely what caused the fatigue; work on weight loss; so glad no more nicotine      Relevant Medications   amLODipine (NORVASC) 10 MG tablet   losartan (COZAAR) 50 MG tablet     Musculoskeletal and Integument   Tinea versicolor    Explained dx, treatment;  may recur        Other   Gout    Will avoid HCTZ and avoid high-purine foods ([place], gravy)      Hyperlipidemia    Continue to cut back on saturated fats, keep trying to lose fat/weight      Relevant Medications   amLODipine (NORVASC) 10 MG tablet   losartan (COZAAR) 50 MG tablet   Other Relevant Orders   Lipid Panel w/o Chol/HDL Ratio   Low HDL (under 40)    Check lipids today (not completely fasting, had Cheerios witih 2% milk); exercising more which is great      Medication monitoring encounter   Relevant Orders   Comprehensive metabolic panel   Obesity     Work on weight loss; see AVS; we talked about the fact that he has gained some weight; some may be muscle, but he still needs to work on weight loss, losing fat          Follow up plan: Return in about 3 weeks (around 03/30/2015) for high blood pressure (non-fasting).  Orders Placed This Encounter  Procedures  . Comprehensive metabolic panel  . Lipid Panel w/o Chol/HDL Ratio   An after-visit summary was printed and given to the patient at [facility].  Please see the patient instructions which may contain other information and recommendations beyond what is mentioned above in the assessment and plan.

## 2015-03-10 LAB — COMPREHENSIVE METABOLIC PANEL
ALT: 22 IU/L (ref 0–44)
AST: 19 IU/L (ref 0–40)
Albumin/Globulin Ratio: 1.8 (ref 1.1–2.5)
Albumin: 4.4 g/dL (ref 3.5–5.5)
Alkaline Phosphatase: 90 IU/L (ref 39–117)
BUN/Creatinine Ratio: 14 (ref 9–20)
BUN: 13 mg/dL (ref 6–24)
Bilirubin Total: 0.4 mg/dL (ref 0.0–1.2)
CALCIUM: 10 mg/dL (ref 8.7–10.2)
CO2: 25 mmol/L (ref 18–29)
Chloride: 101 mmol/L (ref 97–106)
Creatinine, Ser: 0.9 mg/dL (ref 0.76–1.27)
GFR calc Af Amer: 118 mL/min/{1.73_m2} (ref >59)
GFR, EST NON AFRICAN AMERICAN: 102 mL/min/{1.73_m2} (ref >59)
GLOBULIN, TOTAL: 2.5 g/dL (ref 1.5–4.5)
Glucose: 103 mg/dL — ABNORMAL HIGH (ref 65–99)
Potassium: 4.9 mmol/L (ref 3.5–5.2)
Sodium: 141 mmol/L (ref 136–144)
Total Protein: 6.9 g/dL (ref 6.0–8.5)

## 2015-03-10 LAB — LIPID PANEL W/O CHOL/HDL RATIO
Cholesterol, Total: 161 mg/dL (ref 100–199)
HDL: 39 mg/dL — AB (ref >39)
LDL CALC: 96 mg/dL (ref 0–99)
TRIGLYCERIDES: 131 mg/dL (ref 0–149)
VLDL Cholesterol Cal: 26 mg/dL (ref 5–40)

## 2015-03-17 ENCOUNTER — Encounter: Payer: Self-pay | Admitting: Family Medicine

## 2015-03-30 ENCOUNTER — Ambulatory Visit: Payer: BLUE CROSS/BLUE SHIELD | Admitting: Family Medicine

## 2015-04-19 DIAGNOSIS — Z87448 Personal history of other diseases of urinary system: Secondary | ICD-10-CM

## 2015-04-19 HISTORY — DX: Personal history of other diseases of urinary system: Z87.448

## 2015-05-08 ENCOUNTER — Emergency Department: Payer: BLUE CROSS/BLUE SHIELD

## 2015-05-08 ENCOUNTER — Encounter: Payer: Self-pay | Admitting: Family Medicine

## 2015-05-08 ENCOUNTER — Emergency Department
Admission: EM | Admit: 2015-05-08 | Discharge: 2015-05-08 | Disposition: A | Discharge status: Home / Self Care | Payer: BLUE CROSS/BLUE SHIELD | Source: Home / Self Care | Attending: Emergency Medicine | Admitting: Emergency Medicine

## 2015-05-08 ENCOUNTER — Ambulatory Visit (INDEPENDENT_AMBULATORY_CARE_PROVIDER_SITE_OTHER): Payer: BLUE CROSS/BLUE SHIELD | Admitting: Family Medicine

## 2015-05-08 ENCOUNTER — Encounter: Payer: Self-pay | Admitting: *Deleted

## 2015-05-08 VITALS — BP 166/111 | HR 65 | Temp 96.6°F | Ht 74.0 in | Wt 238.8 lb

## 2015-05-08 DIAGNOSIS — R61 Generalized hyperhidrosis: Secondary | ICD-10-CM

## 2015-05-08 DIAGNOSIS — R059 Cough, unspecified: Secondary | ICD-10-CM

## 2015-05-08 DIAGNOSIS — M791 Myalgia, unspecified site: Secondary | ICD-10-CM

## 2015-05-08 DIAGNOSIS — R079 Chest pain, unspecified: Secondary | ICD-10-CM | POA: Insufficient documentation

## 2015-05-08 DIAGNOSIS — I10 Essential (primary) hypertension: Secondary | ICD-10-CM

## 2015-05-08 DIAGNOSIS — Z79899 Other long term (current) drug therapy: Secondary | ICD-10-CM

## 2015-05-08 DIAGNOSIS — R0789 Other chest pain: Secondary | ICD-10-CM

## 2015-05-08 DIAGNOSIS — E786 Lipoprotein deficiency: Secondary | ICD-10-CM | POA: Diagnosis not present

## 2015-05-08 DIAGNOSIS — R1011 Right upper quadrant pain: Secondary | ICD-10-CM | POA: Insufficient documentation

## 2015-05-08 DIAGNOSIS — R05 Cough: Secondary | ICD-10-CM

## 2015-05-08 DIAGNOSIS — R109 Unspecified abdominal pain: Secondary | ICD-10-CM

## 2015-05-08 DIAGNOSIS — R0981 Nasal congestion: Secondary | ICD-10-CM

## 2015-05-08 DIAGNOSIS — N10 Acute tubulo-interstitial nephritis: Secondary | ICD-10-CM | POA: Diagnosis not present

## 2015-05-08 DIAGNOSIS — N12 Tubulo-interstitial nephritis, not specified as acute or chronic: Secondary | ICD-10-CM | POA: Diagnosis not present

## 2015-05-08 LAB — BASIC METABOLIC PANEL
Anion gap: 7 (ref 5–15)
BUN: 17 mg/dL (ref 6–20)
CHLORIDE: 105 mmol/L (ref 101–111)
CO2: 30 mmol/L (ref 22–32)
Calcium: 9.2 mg/dL (ref 8.9–10.3)
Creatinine, Ser: 1.21 mg/dL (ref 0.61–1.24)
GFR calc Af Amer: >60 mL/min (ref >60)
GFR calc non Af Amer: >60 mL/min (ref >60)
GLUCOSE: 119 mg/dL — AB (ref 65–99)
POTASSIUM: 3.7 mmol/L (ref 3.5–5.1)
Sodium: 142 mmol/L (ref 135–145)

## 2015-05-08 LAB — HEPATIC FUNCTION PANEL
ALT: 101 U/L — ABNORMAL HIGH (ref 17–63)
AST: 71 U/L — ABNORMAL HIGH (ref 15–41)
Albumin: 4.1 g/dL (ref 3.5–5.0)
Alkaline Phosphatase: 82 U/L (ref 38–126)
BILIRUBIN DIRECT: 0.1 mg/dL (ref 0.1–0.5)
BILIRUBIN INDIRECT: 0.4 mg/dL (ref 0.3–0.9)
BILIRUBIN TOTAL: 0.5 mg/dL (ref 0.3–1.2)
Total Protein: 7.4 g/dL (ref 6.5–8.1)

## 2015-05-08 LAB — CBC
HEMATOCRIT: 41.8 % (ref 40.0–52.0)
Hemoglobin: 14.1 g/dL (ref 13.0–18.0)
MCH: 28.4 pg (ref 26.0–34.0)
MCHC: 33.8 g/dL (ref 32.0–36.0)
MCV: 83.8 fL (ref 80.0–100.0)
Platelets: 179 10*3/uL (ref 150–440)
RBC: 4.98 MIL/uL (ref 4.40–5.90)
RDW: 13.5 % (ref 11.5–14.5)
WBC: 7.2 10*3/uL (ref 3.8–10.6)

## 2015-05-08 LAB — TROPONIN I

## 2015-05-08 LAB — LIPASE, BLOOD: LIPASE: 24 U/L (ref 11–51)

## 2015-05-08 MED ORDER — TRAMADOL HCL 50 MG PO TABS: 50.0000 mg | ORAL_TABLET | Freq: Four times a day (QID) | ORAL | Status: DC | PRN

## 2015-05-08 MED ORDER — KETOROLAC TROMETHAMINE 30 MG/ML IJ SOLN
30.0000 mg | Freq: Once | INTRAMUSCULAR | Status: AC
  Administered 2015-05-08: 30 mg via INTRAVENOUS
  Filled 2015-05-08: qty 1

## 2015-05-08 NOTE — Discharge Instructions (Signed)
Abdominal Pain, Adult Many things can cause abdominal pain. Usually, abdominal pain is not caused by a disease and will improve without treatment. It can often be observed and treated at home. Your health care provider will do a physical exam and possibly order blood tests and X-rays to help determine the seriousness of your pain. However, in many cases, more time must pass before a clear cause of the pain can be found. Before that point, your health care provider may not know if you need more testing or further treatment. HOME CARE INSTRUCTIONS Monitor your abdominal pain for any changes. The following actions may help to alleviate any discomfort you are experiencing:  Only take over-the-counter or prescription medicines as directed by your health care provider.  Do not take laxatives unless directed to do so by your health care provider.  Try a clear liquid diet (broth, tea, or water) as directed by your health care provider. Slowly move to a bland diet as tolerated. SEEK MEDICAL CARE IF:  You have unexplained abdominal pain.  You have abdominal pain associated with nausea or diarrhea.  You have pain when you urinate or have a bowel movement.  You experience abdominal pain that wakes you in the night.  You have abdominal pain that is worsened or improved by eating food.  You have abdominal pain that is worsened with eating fatty foods.  You have a fever. SEEK IMMEDIATE MEDICAL CARE IF:  Your pain does not go away within 2 hours.  You keep throwing up (vomiting).  Your pain is felt only in portions of the abdomen, such as the right side or the left lower portion of the abdomen.  You pass bloody or black tarry stools. MAKE SURE YOU:  Understand these instructions.  Will watch your condition.  Will get help right away if you are not doing well or get worse.   This information is not intended to replace advice given to you by your health care provider. Make sure you discuss  any questions you have with your health care provider.   Document Released: 01/12/2005 Document Revised: 12/24/2014 Document Reviewed: 12/12/2012 Elsevier Interactive Patient Education 2016 [employer].  Hypertension Hypertension is another name for high blood pressure. High blood pressure forces your heart to work harder to pump blood. A blood pressure reading has two numbers, which includes a higher number over a lower number (example: 110/72). HOME CARE   Have your blood pressure rechecked by your doctor.  Only take medicine as told by your doctor. Follow the directions carefully. The medicine does not work as well if you skip doses. Skipping doses also puts you at risk for problems.  Do not smoke.  Monitor your blood pressure at home as told by your doctor. GET HELP IF:  You think you are having a reaction to the medicine you are taking.  You have repeat headaches or feel dizzy.  You have puffiness (swelling) in your ankles.  You have trouble with your vision. GET HELP RIGHT AWAY IF:   You get a very bad headache and are confused.  You feel weak, numb, or faint.  You get chest or belly (abdominal) pain.  You throw up (vomit).  You cannot breathe very well. MAKE SURE YOU:   Understand these instructions.  Will watch your condition.  Will get help right away if you are not doing well or get worse.   This information is not intended to replace advice given to you by your health care provider.  Make sure you discuss any questions you have with your health care provider.   Document Released: 09/21/2007 Document Revised: 04/09/2013 Document Reviewed: 01/25/2013 Elsevier Interactive Patient Education 2016 Else[employer] Inc[employer]  Nonspecific Chest Pain It is often hard to find the cause of chest pain. There is always a chance that your pain could be related to something serious, such as a heart attack or a blood clot in your lungs. Chest pain can also be caused by conditions  that are not life-threatening. If you have chest pain, it is very important to follow up with your doctor.  HOME CARE  If you were prescribed an antibiotic medicine, finish it all even if you start to feel better.  Avoid any activities that cause chest pain.  Do not use any tobacco products, including cigarettes, chewing tobacco, or electronic cigarettes. If you need help quitting, ask your doctor.  Do not drink alcohol.  Take medicines only as told by your doctor.  Keep all follow-up visits as told by your doctor. This is important. This includes any further testing if your chest pain does not go away.  Your doctor may tell you to keep your head raised (elevated) while you sleep.  Make lifestyle changes as told by your doctor. These may include:  Getting regular exercise. Ask your doctor to suggest some activities that are safe for you.  Eating a heart-healthy diet. Your doctor or a diet specialist (dietitian) can help you to learn healthy eating options.  Maintaining a healthy weight.  Managing diabetes, if necessary.  Reducing stress. GET HELP IF:  Your chest pain does not go away, even after treatment.  You have a rash with blisters on your chest.  You have a fever. GET HELP RIGHT AWAY IF:  Your chest pain is worse.  You have an increasing cough, or you cough up blood.  You have severe belly (abdominal) pain.  You feel extremely weak.  You pass out (faint).  You have chills.  You have sudden, unexplained chest discomfort.  You have sudden, unexplained discomfort in your arms, back, neck, or jaw.  You have shortness of breath at any time.  You suddenly start to sweat, or your skin gets clammy.  You feel nauseous.  You vomit.  You suddenly feel light-headed or dizzy.  Your heart begins to beat quickly, or it feels like it is skipping beats. These symptoms may be an emergency. Do not wait to see if the symptoms will go away. Get medical help right  away. Call your local emergency services (911 in the [place] Do not drive yourself to the hospital.   This information is not intended to replace advice given to you by your health care provider. Make sure you discuss any questions you have with your health care provider.   Document Released: 09/21/2007 Document Revised: 04/25/2014 Document Reviewed: 11/08/2013 Elsevier Interactive Patient Education [employer].

## 2015-05-08 NOTE — ED Notes (Addendum)
Report to April, RN

## 2015-05-08 NOTE — ED Notes (Signed)
md in to reassess. 

## 2015-05-08 NOTE — ED Notes (Signed)
MD at bedside. 

## 2015-05-08 NOTE — ED Provider Notes (Addendum)
[facility] Emergency Department Provider Note  Time seen: 3:51 PM  I have reviewed the triage vital signs and the nursing notes.   HISTORY  Chief Complaint Chest Pain    HPI David [name] is a 45 y.o. male with a past medical history of hyperlipidemia, hypertension, MI per patient, who presents to the emergency department from his primary care doctor with hypertension and chest pain. According to the patient he has not been feeling well for the past 2-3 days he describes congestion, occasional cough, so he saw his primary care doctor today. At the primary care doctor's office he also mentions intermittent epigastric and chest pain for the past 2 days, was found to be hypertensive in the 0000000 systolic so the patient was sent to the emergency department for evaluation. Here patient states slight right upper quadrant tenderness to palpation, otherwise denies any pain on history or exam. States occasional cough at night but denies any during the day, denies sputum production.    Past Medical History  Diagnosis Date  . Gout   . Gynecomastia, male left  . Eczema   . Hyperlipidemia   . Hypertension   . Snoring     Patient Active Problem List   Diagnosis Date Noted  . Abdominal pain, right upper quadrant 05/08/2015  . Chest pain 05/08/2015  . Medication monitoring encounter 03/09/2015  . Tinea versicolor 03/09/2015  . Obesity 03/09/2015  . Low HDL (under 40) 11/04/2014  . Gout   . Gynecomastia, male   . Eczema   . Hyperlipidemia   . Hypertension   . Snoring     History reviewed. No pertinent past surgical history.  Current Outpatient Rx  Name  Route  Sig  Dispense  Refill  . amLODipine (NORVASC) 10 MG tablet   Oral   Take 1 tablet (10 mg total) by mouth daily.   30 tablet   11   . colchicine 0.6 MG tablet   Oral   Take 0.6 mg by mouth daily. 2 pills by mouth now and then one pill one hour later (for gout flares).         . cyclobenzaprine  (FLEXERIL) 10 MG tablet   Oral   Take 10 mg by mouth daily.      0   . losartan (COZAAR) 50 MG tablet   Oral   Take 1 tablet (50 mg total) by mouth daily.   30 tablet   1   . oxyCODONE-acetaminophen (PERCOCET/ROXICET) 5-325 MG per tablet      take 1 or 1 and 1/2 tablet by mouth every 6 hours if needed for pain      0     Allergies Review of patient's allergies indicates no known allergies.  Family History  Problem Relation Age of Onset  . Hypertension Mother   . Kidney disease Father   . Hypertension Father   . Dementia Father   . Squamous cell carcinoma Father   . Cancer Father     SCC  . Diabetes Father   . Heart disease Father   . Cancer Brother     hodgkin's lymphoma  . Heart disease Brother 74    3 vessel CABG  . Heart disease Maternal Grandmother   . Stroke Maternal Grandmother   . Heart disease Maternal Grandfather   . Stroke Maternal Grandfather   . Heart disease Paternal Grandmother   . Heart disease Paternal Grandfather     Social History Social History  Substance  Use Topics  . Smoking status: Never Smoker   . Smokeless tobacco: Former Systems developer    Types: Chew  . Alcohol Use: No    Review of Systems Constitutional: Negative for fever. ENT: Mild congestion Cardiovascular: Negative for chest pain. Respiratory: Negative for shortness of breath. Occasional cough at night. Gastrointestinal: Negative for abdominal pain Neurological: Negative for headache 10-point ROS otherwise negative.  ____________________________________________   PHYSICAL EXAM:  VITAL SIGNS: ED Triage Vitals  Enc Vitals Group     BP 05/08/15 1540 190/96 mmHg     Pulse Rate 05/08/15 1540 71     Resp 05/08/15 1540 16     Temp 05/08/15 1540 98 F (36.7 C)     Temp Source 05/08/15 1540 Oral     SpO2 05/08/15 1540 96 %     Weight 05/08/15 1540 238 lb (107.956 kg)     Height 05/08/15 1540 6\' 2"  (1.88 m)     Head Cir --      Peak Flow --      Pain Score --      Pain Loc  --      Pain Edu? --      Excl. in [place]? --     Constitutional: Alert and oriented. Well appearing and in no distress. Eyes: Normal exam ENT   Head: Normocephalic and atraumatic.   Mouth/Throat: Mucous membranes are moist. Cardiovascular: Normal rate, regular rhythm. No murmur Respiratory: Normal respiratory effort without tachypnea nor retractions. Breath sounds are clear and equal bilaterally. No wheezes/rales/rhonchi. Gastrointestinal: Soft, slight right upper quadrant tenderness to palpation. No rebound or guarding, otherwise a 9 abdominal exam. No distention. Musculoskeletal: Nontender with normal range of motion in all extremities.  Neurologic:  Normal speech and language. No gross focal neurologic deficits Skin:  Skin is warm, dry and intact.  Psychiatric: Mood and affect are normal. Speech and behavior are normal.  ____________________________________________    EKG  EKG reviewed and interpreted by myself shows normal sinus rhythm at 66 bpm, slightly widened QRS, prolonged PR interval consistent with first-degree AV block, nonspecific ST changes. No ST elevations.  ____________________________________________    RADIOLOGY  Chest x-ray within normal limits  ____________________________________________   INITIAL IMPRESSION / ASSESSMENT AND PLAN / ED COURSE  Pertinent labs & imaging results that were available during my care of the patient were reviewed by me and considered in my medical decision making (see chart for details).  Patient with hypertension, 2 days of intermittent epigastric and chest discomfort sent from his primary care doctor for further evaluation. We'll check labs including troponin, lipase and hepatic function panel. We'll obtain an x-ray. EKG shows nonspecific changes. Overall the patient appears very well, no complaints, states he is not sure why they sent him to the emergency department.  Patient's labs have resulted showing slightly elevated  liver function testing, given his mild right upper quadrant tenderness to palpation with elevated LFTs we'll proceed with a right upper quadrant ultrasound. Patient's troponin is negative, chest x-ray negative, do not suspect a cardiac cause to his discomfort.  Ultrasound within normal limits. We'll discharge home with a short course of pain medication and primary care follow-up.  ____________________________________________   FINAL CLINICAL IMPRESSION(S) / ED DIAGNOSES  Hypertension Chest pain   Harvest Dark, MD 05/08/15 1720  Harvest Dark, MD 05/08/15 2008

## 2015-05-08 NOTE — ED Notes (Addendum)
Pt arrived to ED from [facility] after a reported elevated blood pressure of 164/84 and right sided and epigastric chest pain that began 2 days ago. Pt reports being lightheaded and dizzy today with sweating noted this afternoon. Pt is alert and oriented at this time and denies SOB. Pt has hx of MI and had stopped taking BP medication 2 days ago. Pt was also given 324 ASA and 1 sublingual nitro per EMS. Pt reports nitro decreased pain.

## 2015-05-08 NOTE — Patient Instructions (Signed)
Try to use PLAIN allergy medicine without the decongestant Avoid: phenylephrine, phenylpropanolamine, and pseudoephredine   

## 2015-05-08 NOTE — ED Notes (Signed)
Pt to ultrasound

## 2015-05-08 NOTE — ED Notes (Signed)
Pt returned from ultrasound

## 2015-05-08 NOTE — Progress Notes (Signed)
BP 166/111 mmHg  Pulse 65  Temp(Src) 96.6 F (35.9 C)  Ht 6\' 2"  (1.88 m)  Wt 238 lb 12.8 oz (108.319 kg)  BMI 30.65 kg/m2  SpO2 97%   Subjective:    Patient ID: David [name], male    DOB: 1970/05/11, 45 y.o.   MRN: [id number]  HPI: David [name] is a 45 y.o. male  Chief Complaint  Patient presents with  . Back Pain    Patient states "everything hurts"  . Chest Pain  . Leg Pain  . Nasal Congestion  . Sore Throat   He hurts all over; chest pain, back pain, leg pain He hurts in his back, his legs, his chest; three days duration; he just hurts on the inside Chest feels like it's in a vise; pain is 4 out of 10 He has no fever No runny nose, but sinus congestion No rash Never had anything like this before He has not taken BP pills for two days, wanted to shake this and not mix medicine; taking Mucinex; has nasal congestion and sore throat as well RUQ pain for a month; after eating sandwiches  He says that twice he had to have a needle shoved into his chest, 3-4 years ago; wife says NO, they did not put a needle in his heart; by the time she got him to the ER, she heard the doctor say if we don't hurry up and administer this medication, his heart is going to explode; perhaps adenosine (?); she says that he did have a mild heart attack  Relevant past medical, surgical, family and social history reviewed and updated as indicated. Interim medical history since our last visit reviewed. Past Medical History  Diagnosis Date  . Gout   . Gynecomastia, male left  . Eczema   . Hyperlipidemia   . Hypertension   . Snoring    Past Surgical History  Procedure Laterality Date  . No past surgeries     Family History  Problem Relation Age of Onset  . Hypertension Mother   . Kidney disease Father   . Hypertension Father   . Dementia Father   . Squamous cell carcinoma Father   . Cancer Father     SCC  . Diabetes Father   . Heart disease Father   . Cancer Brother     hodgkin's  lymphoma  . Heart disease Brother 52    3 vessel CABG  . Heart disease Maternal Grandmother   . Stroke Maternal Grandmother   . Heart disease Maternal Grandfather   . Stroke Maternal Grandfather   . Heart disease Paternal Grandmother   . Heart disease Paternal Grandfather    Allergies and medications reviewed and updated.  Review of Systems Per HPI unless specifically indicated above     Objective:    BP 166/111 mmHg  Pulse 65  Temp(Src) 96.6 F (35.9 C)  Ht 6\' 2"  (1.88 m)  Wt 238 lb 12.8 oz (108.319 kg)  BMI 30.65 kg/m2  SpO2 97%  Wt Readings from Last 3 Encounters:  05/09/15 238 lb (107.956 kg)  05/08/15 238 lb (107.956 kg)  05/08/15 238 lb 12.8 oz (108.319 kg)   Physical Exam  Constitutional: He appears well-developed and well-nourished.  Non-toxic appearance. He appears ill. He appears distressed (appears to not feel well, color is wan).  HENT:  Right Ear: Hearing, tympanic membrane, external ear and ear canal normal.  Left Ear: Hearing, tympanic membrane, external ear and ear canal normal.  Nose:  No rhinorrhea.  Mouth/Throat: Mucous membranes are not pale and not dry.  Eyes: Right eye exhibits no discharge. Left eye exhibits no discharge. Right conjunctiva is not injected. Left conjunctiva is not injected.  Neck: No JVD present. No thyromegaly present.  Cardiovascular: Normal rate and regular rhythm.   No extrasystoles are present.  No murmur heard. Pulmonary/Chest: Effort normal and breath sounds normal. No respiratory distress. He exhibits no tenderness and no bony tenderness.  Abdominal: There is tenderness in the right upper quadrant. There is guarding (mild) and positive Murphy's sign (equivocal).  Musculoskeletal: He exhibits no edema.  Lymphadenopathy:    He has no cervical adenopathy.  Neurological: He is alert.  Skin: Skin is warm. He is not diaphoretic. There is pallor (color is wan).  Psychiatric: His mood appears anxious (mildly anxious).  Good eye  contact with examiner; obviously not feeling well   Results for orders placed or performed in visit on 03/09/15  Comprehensive metabolic panel  Result Value Ref Range   Glucose 103 (H) 65 - 99 mg/dL   BUN 13 6 - 24 mg/dL   Creatinine, Ser 0.90 0.76 - 1.27 mg/dL   GFR calc non Af Amer 102 >59 mL/min/1.73   GFR calc Af Amer 118 >59 mL/min/1.73   BUN/Creatinine Ratio 14 9 - 20   Sodium 141 136 - 144 mmol/L   Potassium 4.9 3.5 - 5.2 mmol/L   Chloride 101 97 - 106 mmol/L   CO2 25 18 - 29 mmol/L   Calcium 10.0 8.7 - 10.2 mg/dL   Total Protein 6.9 6.0 - 8.5 g/dL   Albumin 4.4 3.5 - 5.5 g/dL   Globulin, Total 2.5 1.5 - 4.5 g/dL   Albumin/Globulin Ratio 1.8 1.1 - 2.5   Bilirubin Total 0.4 0.0 - 1.2 mg/dL   Alkaline Phosphatase 90 39 - 117 IU/L   AST 19 0 - 40 IU/L   ALT 22 0 - 44 IU/L  Lipid Panel w/o Chol/HDL Ratio  Result Value Ref Range   Cholesterol, Total 161 100 - 199 mg/dL   Triglycerides 131 0 - 149 mg/dL   HDL 39 (L) >39 mg/dL   VLDL Cholesterol Cal 26 5 - 40 mg/dL   LDL Calculated 96 0 - 99 mg/dL      Assessment & Plan:   Problem List Items Addressed This Visit      Cardiovascular and Mediastinum   Hypertension    Marked hypertension; has not taken antihypertensives for two days and sounds to have taken decongestants; uncontrolled        Other   Low HDL (under 40)    Another cardiac risk factor; noted, not checked today      Abdominal pain, right upper quadrant    Unusual presention, combined with chest pain, back pain; cholecystitis certainly a possibility, but also enteritis, pneumonia, flu; he is going straight to the ER for evaluation, work-up      Relevant Orders   ALT (SGPT) Piccolo, Waived   AST (SGOT) Piccolo, Waived   Chest pain - Primary    Chest pain, abdominal pain, back pain; unusual presentation; the most worrisome sx that I can do anything about until EMS arrives is to treat for possible angina, given his abnormal EKG, strong family hx of  coronary artery disease, and poorly controlled HTN combined with use of decongestants over the last 2 days; NTG and asprin and oxygen given here, EMS taking him to ER for evaluation and work-up; ddx was discussed with patient  including flu, pneumonia, cholecystitis, but the only thing I could treat in my office again was the possibility of angina as cause of symptoms; aware this is atypical presentation for heart attack; he left in stable condition with EMS      Relevant Orders   EKG 12-Lead (Completed)    Other Visit Diagnoses    Myalgia        chest pain, RUQ pain, back pain; unusual presentation; sending to ER for evaluation    Relevant Orders    CBC With Differential/Platelet    Influenza a and b    Cough        sinus congestion, myalgias; ddx includes flu, pneumonia; he denies TB exposure; he is going to the ER for evaluation    Relevant Orders    CBC With Differential/Platelet    Influenza a and b    Unexplained night sweats        sending to ER for eval; discussed ddx    Relevant Orders    CBC With Differential/Platelet    Influenza a and b    ANA w/Reflex if Positive    C-reactive protein      Labs ordered but none drawn, as I opted to send patient to the hospital via EMS  Follow up plan: No Follow-up on file. --> after ER / hospitalization  EKG obtained; abnormal, compared to prior; normal sinus rhythm, significant RBBB 4 baby aspirin given 256 received 1 spray NTG SL Chest pain 4 out of 10 Recheck BP 186/92 Pain unchanged 5 minutes later EMS took patient in stable condition to ER

## 2015-05-09 ENCOUNTER — Emergency Department: Payer: BLUE CROSS/BLUE SHIELD

## 2015-05-09 ENCOUNTER — Encounter: Payer: Self-pay | Admitting: Emergency Medicine

## 2015-05-09 ENCOUNTER — Inpatient Hospital Stay
Admission: EM | Admit: 2015-05-09 | Discharge: 2015-05-14 | DRG: 690 | Disposition: A | Discharge status: Home / Self Care | Payer: BLUE CROSS/BLUE SHIELD | Attending: Internal Medicine | Admitting: Internal Medicine

## 2015-05-09 DIAGNOSIS — I10 Essential (primary) hypertension: Secondary | ICD-10-CM | POA: Diagnosis present

## 2015-05-09 DIAGNOSIS — R21 Rash and other nonspecific skin eruption: Secondary | ICD-10-CM | POA: Diagnosis present

## 2015-05-09 DIAGNOSIS — N170 Acute kidney failure with tubular necrosis: Secondary | ICD-10-CM | POA: Diagnosis present

## 2015-05-09 DIAGNOSIS — N10 Acute tubulo-interstitial nephritis: Secondary | ICD-10-CM | POA: Diagnosis present

## 2015-05-09 DIAGNOSIS — M109 Gout, unspecified: Secondary | ICD-10-CM | POA: Diagnosis present

## 2015-05-09 DIAGNOSIS — Z8249 Family history of ischemic heart disease and other diseases of the circulatory system: Secondary | ICD-10-CM

## 2015-05-09 DIAGNOSIS — I509 Heart failure, unspecified: Secondary | ICD-10-CM

## 2015-05-09 DIAGNOSIS — Z841 Family history of disorders of kidney and ureter: Secondary | ICD-10-CM | POA: Diagnosis not present

## 2015-05-09 DIAGNOSIS — R109 Unspecified abdominal pain: Secondary | ICD-10-CM | POA: Diagnosis not present

## 2015-05-09 DIAGNOSIS — N059 Unspecified nephritic syndrome with unspecified morphologic changes: Secondary | ICD-10-CM | POA: Diagnosis present

## 2015-05-09 DIAGNOSIS — N12 Tubulo-interstitial nephritis, not specified as acute or chronic: Secondary | ICD-10-CM

## 2015-05-09 DIAGNOSIS — Z820 Family history of epilepsy and other diseases of the nervous system: Secondary | ICD-10-CM

## 2015-05-09 DIAGNOSIS — Z720 Tobacco use: Secondary | ICD-10-CM | POA: Diagnosis not present

## 2015-05-09 DIAGNOSIS — Z807 Family history of other malignant neoplasms of lymphoid, hematopoietic and related tissues: Secondary | ICD-10-CM | POA: Diagnosis not present

## 2015-05-09 DIAGNOSIS — E785 Hyperlipidemia, unspecified: Secondary | ICD-10-CM | POA: Diagnosis present

## 2015-05-09 DIAGNOSIS — Z833 Family history of diabetes mellitus: Secondary | ICD-10-CM | POA: Diagnosis not present

## 2015-05-09 DIAGNOSIS — R10A0 Flank pain, unspecified side: Secondary | ICD-10-CM

## 2015-05-09 DIAGNOSIS — N179 Acute kidney failure, unspecified: Secondary | ICD-10-CM | POA: Diagnosis present

## 2015-05-09 DIAGNOSIS — Z823 Family history of stroke: Secondary | ICD-10-CM | POA: Diagnosis not present

## 2015-05-09 DIAGNOSIS — N281 Cyst of kidney, acquired: Secondary | ICD-10-CM | POA: Diagnosis present

## 2015-05-09 DIAGNOSIS — E860 Dehydration: Secondary | ICD-10-CM | POA: Diagnosis present

## 2015-05-09 DIAGNOSIS — Z79899 Other long term (current) drug therapy: Secondary | ICD-10-CM

## 2015-05-09 DIAGNOSIS — T402X5A Adverse effect of other opioids, initial encounter: Secondary | ICD-10-CM | POA: Diagnosis present

## 2015-05-09 DIAGNOSIS — R7989 Other specified abnormal findings of blood chemistry: Secondary | ICD-10-CM | POA: Diagnosis present

## 2015-05-09 LAB — CBC
HCT: 43.3 % (ref 40.0–52.0)
Hemoglobin: 14.3 g/dL (ref 13.0–18.0)
MCH: 27.4 pg (ref 26.0–34.0)
MCHC: 33.1 g/dL (ref 32.0–36.0)
MCV: 82.8 fL (ref 80.0–100.0)
PLATELETS: 200 10*3/uL (ref 150–440)
RBC: 5.23 MIL/uL (ref 4.40–5.90)
RDW: 13.3 % (ref 11.5–14.5)
WBC: 13.4 10*3/uL — AB (ref 3.8–10.6)

## 2015-05-09 LAB — COMPREHENSIVE METABOLIC PANEL
ALT: 102 U/L — AB (ref 17–63)
AST: 62 U/L — AB (ref 15–41)
Albumin: 4.6 g/dL (ref 3.5–5.0)
Alkaline Phosphatase: 78 U/L (ref 38–126)
Anion gap: 9 (ref 5–15)
BUN: 21 mg/dL — AB (ref 6–20)
CHLORIDE: 108 mmol/L (ref 101–111)
CO2: 26 mmol/L (ref 22–32)
CREATININE: 1.47 mg/dL — AB (ref 0.61–1.24)
Calcium: 9.5 mg/dL (ref 8.9–10.3)
GFR calc Af Amer: >60 mL/min (ref >60)
GFR, EST NON AFRICAN AMERICAN: 56 mL/min — AB (ref >60)
Glucose, Bld: 101 mg/dL — ABNORMAL HIGH (ref 65–99)
POTASSIUM: 4.1 mmol/L (ref 3.5–5.1)
SODIUM: 143 mmol/L (ref 135–145)
Total Bilirubin: 0.3 mg/dL (ref 0.3–1.2)
Total Protein: 7.8 g/dL (ref 6.5–8.1)

## 2015-05-09 LAB — URINALYSIS COMPLETE WITH MICROSCOPIC (ARMC ONLY)
BACTERIA UA: NONE SEEN
Bilirubin Urine: NEGATIVE
Glucose, UA: NEGATIVE mg/dL
KETONES UR: NEGATIVE mg/dL
LEUKOCYTES UA: NEGATIVE
NITRITE: NEGATIVE
PH: 5 (ref 5.0–8.0)
PROTEIN: 100 mg/dL — AB
SPECIFIC GRAVITY, URINE: 1.023 (ref 1.005–1.030)

## 2015-05-09 LAB — TROPONIN I

## 2015-05-09 LAB — LIPASE, BLOOD: LIPASE: 24 U/L (ref 11–51)

## 2015-05-09 MED ORDER — ONDANSETRON HCL 4 MG/2ML IJ SOLN
4.0000 mg | Freq: Once | INTRAMUSCULAR | Status: AC
  Administered 2015-05-09: 4 mg via INTRAVENOUS

## 2015-05-09 MED ORDER — ONDANSETRON HCL 4 MG/2ML IJ SOLN
INTRAMUSCULAR | Status: AC
  Administered 2015-05-09: 4 mg via INTRAVENOUS
  Filled 2015-05-09: qty 2

## 2015-05-09 MED ORDER — HYDROMORPHONE HCL 1 MG/ML IJ SOLN
0.5000 mg | Freq: Once | INTRAMUSCULAR | Status: AC
  Administered 2015-05-09: 0.5 mg via INTRAVENOUS
  Filled 2015-05-09: qty 1

## 2015-05-09 MED ORDER — DEXTROSE 5 % IV SOLN
1.0000 g | Freq: Once | INTRAVENOUS | Status: AC
  Administered 2015-05-09: 1 g via INTRAVENOUS
  Filled 2015-05-09: qty 10

## 2015-05-09 MED ORDER — MORPHINE SULFATE (PF) 4 MG/ML IV SOLN
4.0000 mg | Freq: Once | INTRAVENOUS | Status: AC
  Administered 2015-05-09: 4 mg via INTRAVENOUS

## 2015-05-09 MED ORDER — MORPHINE SULFATE (PF) 4 MG/ML IV SOLN
4.0000 mg | Freq: Once | INTRAVENOUS | Status: AC
  Administered 2015-05-09: 4 mg via INTRAVENOUS
  Filled 2015-05-09: qty 1

## 2015-05-09 MED ORDER — IOHEXOL 240 MG/ML SOLN
25.0000 mL | Freq: Once | INTRAMUSCULAR | Status: AC | PRN
  Administered 2015-05-09: 25 mL via ORAL

## 2015-05-09 MED ORDER — ONDANSETRON HCL 4 MG/2ML IJ SOLN
4.0000 mg | Freq: Once | INTRAMUSCULAR | Status: AC
  Administered 2015-05-09: 4 mg via INTRAVENOUS
  Filled 2015-05-09: qty 2

## 2015-05-09 MED ORDER — MORPHINE SULFATE (PF) 4 MG/ML IV SOLN
INTRAVENOUS | Status: AC
  Administered 2015-05-09: 4 mg via INTRAVENOUS
  Filled 2015-05-09: qty 1

## 2015-05-09 MED ORDER — ONDANSETRON HCL 4 MG/2ML IJ SOLN: 4.0000 mg | Freq: Once | INTRAMUSCULAR | Status: DC

## 2015-05-09 MED ORDER — IOHEXOL 300 MG/ML  SOLN
125.0000 mL | Freq: Once | INTRAMUSCULAR | Status: AC | PRN
  Administered 2015-05-09: 125 mL via INTRAVENOUS

## 2015-05-09 MED ORDER — OXYCODONE-ACETAMINOPHEN 5-325 MG PO TABS
ORAL_TABLET | ORAL | Status: AC
  Administered 2015-05-09: 1 via ORAL
  Filled 2015-05-09: qty 1

## 2015-05-09 MED ORDER — OXYCODONE-ACETAMINOPHEN 5-325 MG PO TABS
1.0000 | ORAL_TABLET | Freq: Once | ORAL | Status: AC
  Administered 2015-05-09: 1 via ORAL

## 2015-05-09 NOTE — ED Notes (Signed)
Went to reassess pt and see how pain is. Currently pt sitting still and pain seems better. 7/10 at this time. Given pain med per triage protocol.  Pain comes in waves per pt. Told to return if sx worsen.

## 2015-05-09 NOTE — ED Notes (Signed)
Patient here with RUQ abd pain, seen last night for same complaint, was told to come back if pain got worse. Patient denies vomiting but has had some nausea. Patient states that after he eats he feel bloated.

## 2015-05-09 NOTE — ED Notes (Signed)
Pt began with CP last night and was evaluated here.  Treated and sent home.  Pain never really eased per pt and now severe epigastric pain. Pt unable to sit still r/t pain.  Has been nauseated and lightheaded.

## 2015-05-09 NOTE — ED Provider Notes (Signed)
[facility] Emergency Department Provider Note  ____________________________________________  Time seen: Approximately 5:54 PM  I have reviewed the triage vital signs and the nursing notes.   HISTORY  Chief Complaint Chest Pain    HPI David [name] is a 45 y.o. male patient complains of right upper quadrant pain starting yesterday getting worse today. He ports the pain gets worse after eating about 1 or 2 hours after eating. He is nauseated but has not vomited. The pain is crampy in nature. He has not had any diarrhea and is stooling normally. In passing gas normally. The pain can be severe at times is not currently. Pain is not made worse with deep breathing. The pain is beginning to radiate around the side to the back.   Past Medical History  Diagnosis Date  . Gout   . Gynecomastia, male left  . Eczema   . Hyperlipidemia   . Hypertension   . Snoring     Patient Active Problem List   Diagnosis Date Noted  . Abdominal pain, right upper quadrant 05/08/2015  . Chest pain 05/08/2015  . Medication monitoring encounter 03/09/2015  . Tinea versicolor 03/09/2015  . Obesity 03/09/2015  . Low HDL (under 40) 11/04/2014  . Gout   . Gynecomastia, male   . Eczema   . Hyperlipidemia   . Hypertension   . Snoring     History reviewed. No pertinent past surgical history.  Current Outpatient Rx  Name  Route  Sig  Dispense  Refill  . amLODipine (NORVASC) 10 MG tablet   Oral   Take 1 tablet (10 mg total) by mouth daily.   30 tablet   11   . colchicine 0.6 MG tablet   Oral   Take 0.6 mg by mouth daily. 2 pills by mouth now and then one pill one hour later (for gout flares).         . losartan (COZAAR) 50 MG tablet   Oral   Take 1 tablet (50 mg total) by mouth daily.   30 tablet   1   . traMADol (ULTRAM) 50 MG tablet   Oral   Take 1 tablet (50 mg total) by mouth every 6 (six) hours as needed.   15 tablet   0     Allergies Review of  patient's allergies indicates no known allergies.  Family History  Problem Relation Age of Onset  . Hypertension Mother   . Kidney disease Father   . Hypertension Father   . Dementia Father   . Squamous cell carcinoma Father   . Cancer Father     SCC  . Diabetes Father   . Heart disease Father   . Cancer Brother     hodgkin's lymphoma  . Heart disease Brother 7    3 vessel CABG  . Heart disease Maternal Grandmother   . Stroke Maternal Grandmother   . Heart disease Maternal Grandfather   . Stroke Maternal Grandfather   . Heart disease Paternal Grandmother   . Heart disease Paternal Grandfather     Social History Social History  Substance Use Topics  . Smoking status: Never Smoker   . Smokeless tobacco: Former Systems developer    Types: Chew  . Alcohol Use: No    Review of Systems Constitutional: No fever/chills Eyes: No visual changes. ENT: No sore throat. Cardiovascular: Denies chest pain. Respiratory: Denies shortness of breath. Gastrointestinal: See history of present illness Genitourinary: Negative for dysuria. Musculoskeletal: See history of present illness.  Skin: Negative for rash. Neurological: Negative for headaches, focal weakness or numbness.  10-point ROS otherwise negative.  ____________________________________________   PHYSICAL EXAM:  VITAL SIGNS: ED Triage Vitals  Enc Vitals Group     BP 05/09/15 1410 173/101 mmHg     Pulse Rate 05/09/15 1410 91     Resp 05/09/15 1410 26     Temp 05/09/15 1410 98 F (36.7 C)     Temp Source 05/09/15 1410 Oral     SpO2 05/09/15 1410 100 %     Weight 05/09/15 1410 238 lb (107.956 kg)     Height 05/09/15 1410 6\' 3"  (1.905 m)     Head Cir --      Peak Flow --      Pain Score 05/09/15 1408 10     Pain Loc --      Pain Edu? --      Excl. in [place]? --     Constitutional: Alert and oriented. Well appearing and in no acute distress. Eyes: Conjunctivae are normal. PERRL. EOMI. Head: Atraumatic. Nose: No  congestion/rhinnorhea. Mouth/Throat: Mucous membranes are moist.  Oropharynx non-erythematous. Neck: No stridor.   Cardiovascular: Normal rate, regular rhythm. Grossly normal heart sounds.  Good peripheral circulation. Respiratory: Normal respiratory effort.  No retractions. Lungs CTAB. Gastrointestinal: Soft tender in the right upper quadrant palpation and percussion. Bowel sounds slightly decreased. No distention. No abdominal bruits. No CVA tenderness. Musculoskeletal: No lower extremity tenderness nor edema.  No joint effusions. Neurologic:  Normal speech and language. No gross focal neurologic deficits are appreciated. No gait instability. Skin:  Skin is warm, dry and intact. No rash noted. Psychiatric: Mood and affect are normal. Speech and behavior are normal.  ____________________________________________   LABS (all labs ordered are listed, but only abnormal results are displayed)  Labs Reviewed  CBC - Abnormal; Notable for the following:    WBC 13.4 (*)    All other components within normal limits  COMPREHENSIVE METABOLIC PANEL - Abnormal; Notable for the following:    Glucose, Bld 101 (*)    BUN 21 (*)    Creatinine, Ser 1.47 (*)    AST 62 (*)    ALT 102 (*)    GFR calc non Af Amer 56 (*)    All other components within normal limits  URINALYSIS COMPLETEWITH MICROSCOPIC ([facility] ONLY) - Abnormal; Notable for the following:    Color, Urine STRAW (*)    APPearance CLEAR (*)    Hgb urine dipstick 1+ (*)    Protein, ur 100 (*)    Squamous Epithelial / LPF 0-5 (*)    All other components within normal limits  URINE CULTURE  CULTURE, BLOOD (ROUTINE X 2)  CULTURE, BLOOD (ROUTINE X 2)  TROPONIN I  LIPASE, BLOOD   ____________________________________________  EKG EKG read and interpreted by me shows normal sinus rhythm rate of 97 normal axis right bundle branch block right bundle branch block has been present on previous  EKGs ____________________________________________  RADIOLOGY  Per radiology CT shows severe bilateral kidney inflammation and multiple cysts. I discussed this with the renal doctors and they advised that this is most likely pyelonephritis we should admit the patient for IV antibiotics this is important especially since the patient is vomiting ____________________________________________   PROCEDURES   ____________________________________________   INITIAL IMPRESSION / ASSESSMENT AND PLAN / ED COURSE  Pertinent labs & imaging results that were available during my care of the patient were reviewed by me and considered in my medical  decision making (see chart for details).   ____________________________________________   FINAL CLINICAL IMPRESSION(S) / ED DIAGNOSES  Final diagnoses:  Pyelonephritis      Nena Polio, MD 05/09/15 2315

## 2015-05-09 NOTE — ED Notes (Signed)
Dr. [name] at bedside  

## 2015-05-10 LAB — BASIC METABOLIC PANEL
Anion gap: 7 (ref 5–15)
BUN: 27 mg/dL — AB (ref 6–20)
CALCIUM: 8.8 mg/dL — AB (ref 8.9–10.3)
CO2: 25 mmol/L (ref 22–32)
CREATININE: 2.3 mg/dL — AB (ref 0.61–1.24)
Chloride: 107 mmol/L (ref 101–111)
GFR calc Af Amer: 37 mL/min — ABNORMAL LOW (ref >60)
GFR, EST NON AFRICAN AMERICAN: 32 mL/min — AB (ref >60)
GLUCOSE: 110 mg/dL — AB (ref 65–99)
POTASSIUM: 4.4 mmol/L (ref 3.5–5.1)
SODIUM: 139 mmol/L (ref 135–145)

## 2015-05-10 LAB — CBC
HEMATOCRIT: 40 % (ref 40.0–52.0)
Hemoglobin: 13.7 g/dL (ref 13.0–18.0)
MCH: 28.7 pg (ref 26.0–34.0)
MCHC: 34.3 g/dL (ref 32.0–36.0)
MCV: 83.8 fL (ref 80.0–100.0)
PLATELETS: 167 10*3/uL (ref 150–440)
RBC: 4.78 MIL/uL (ref 4.40–5.90)
RDW: 13.7 % (ref 11.5–14.5)
WBC: 9.6 10*3/uL (ref 3.8–10.6)

## 2015-05-10 LAB — GLUCOSE, CAPILLARY
GLUCOSE-CAPILLARY: 107 mg/dL — AB (ref 65–99)
GLUCOSE-CAPILLARY: 82 mg/dL (ref 65–99)
GLUCOSE-CAPILLARY: 99 mg/dL (ref 65–99)
Glucose-Capillary: 103 mg/dL — ABNORMAL HIGH (ref 65–99)

## 2015-05-10 LAB — PROTEIN / CREATININE RATIO, URINE
Creatinine, Urine: 105 mg/dL
Protein Creatinine Ratio: 0.38 mg/mg{Cre} — ABNORMAL HIGH (ref 0.00–0.15)
Total Protein, Urine: 40 mg/dL

## 2015-05-10 MED ORDER — HYDRALAZINE HCL 20 MG/ML IJ SOLN
10.0000 mg | Freq: Four times a day (QID) | INTRAMUSCULAR | Status: DC | PRN
  Administered 2015-05-13: 10 mg via INTRAVENOUS
  Filled 2015-05-10: qty 1

## 2015-05-10 MED ORDER — HYDROCODONE-ACETAMINOPHEN 5-325 MG PO TABS
1.0000 | ORAL_TABLET | Freq: Four times a day (QID) | ORAL | Status: DC | PRN
  Administered 2015-05-11 – 2015-05-13 (×7): 1 via ORAL
  Filled 2015-05-10 (×7): qty 1

## 2015-05-10 MED ORDER — SODIUM CHLORIDE 0.9 % IV SOLN
INTRAVENOUS | Status: AC
  Administered 2015-05-10: 01:00:00 via INTRAVENOUS

## 2015-05-10 MED ORDER — MORPHINE SULFATE (PF) 2 MG/ML IV SOLN
2.0000 mg | INTRAVENOUS | Status: DC | PRN
  Administered 2015-05-10: 2 mg via INTRAVENOUS
  Filled 2015-05-10: qty 1

## 2015-05-10 MED ORDER — ENOXAPARIN SODIUM 40 MG/0.4ML ~~LOC~~ SOLN
40.0000 mg | Freq: Every day | SUBCUTANEOUS | Status: DC
  Administered 2015-05-10 – 2015-05-12 (×3): 40 mg via SUBCUTANEOUS
  Filled 2015-05-10 (×3): qty 0.4

## 2015-05-10 MED ORDER — ONDANSETRON HCL 4 MG PO TABS: 4.0000 mg | ORAL_TABLET | Freq: Four times a day (QID) | ORAL | Status: DC | PRN

## 2015-05-10 MED ORDER — SODIUM CHLORIDE 0.9 % IV SOLN
INTRAVENOUS | Status: AC
  Administered 2015-05-10 (×2): via INTRAVENOUS

## 2015-05-10 MED ORDER — DEXTROSE 5 % IV SOLN
1.0000 g | INTRAVENOUS | Status: DC
  Administered 2015-05-10 – 2015-05-14 (×4): 1 g via INTRAVENOUS
  Filled 2015-05-10 (×5): qty 10

## 2015-05-10 MED ORDER — MORPHINE SULFATE (PF) 4 MG/ML IV SOLN
4.0000 mg | INTRAVENOUS | Status: DC | PRN
  Administered 2015-05-10 – 2015-05-11 (×4): 4 mg via INTRAVENOUS
  Filled 2015-05-10 (×4): qty 1

## 2015-05-10 MED ORDER — AMLODIPINE BESYLATE 10 MG PO TABS
10.0000 mg | ORAL_TABLET | Freq: Every day | ORAL | Status: DC
  Administered 2015-05-11 – 2015-05-14 (×4): 10 mg via ORAL
  Filled 2015-05-10 (×5): qty 1

## 2015-05-10 MED ORDER — ACETAMINOPHEN 325 MG PO TABS
650.0000 mg | ORAL_TABLET | Freq: Four times a day (QID) | ORAL | Status: DC | PRN
  Administered 2015-05-10 – 2015-05-12 (×5): 650 mg via ORAL
  Filled 2015-05-10 (×5): qty 2

## 2015-05-10 MED ORDER — TUBERCULIN PPD 5 UNIT/0.1ML ID SOLN
5.0000 [IU] | Freq: Once | INTRADERMAL | Status: AC
Start: 2015-05-10 — End: 2015-05-12
  Administered 2015-05-10: 5 [IU] via INTRADERMAL
  Filled 2015-05-10 (×2): qty 0.1

## 2015-05-10 MED ORDER — PROMETHAZINE HCL 25 MG/ML IJ SOLN
25.0000 mg | Freq: Four times a day (QID) | INTRAMUSCULAR | Status: DC | PRN
  Administered 2015-05-10: 25 mg via INTRAVENOUS
  Filled 2015-05-10: qty 1

## 2015-05-10 MED ORDER — ONDANSETRON HCL 4 MG/2ML IJ SOLN
4.0000 mg | Freq: Four times a day (QID) | INTRAMUSCULAR | Status: DC | PRN
  Administered 2015-05-10 – 2015-05-11 (×2): 4 mg via INTRAVENOUS
  Filled 2015-05-10 (×3): qty 2

## 2015-05-10 MED ORDER — HYDROMORPHONE HCL 1 MG/ML IJ SOLN
0.5000 mg | INTRAMUSCULAR | Status: DC | PRN
  Administered 2015-05-10 (×2): 0.5 mg via INTRAVENOUS
  Filled 2015-05-10 (×2): qty 1

## 2015-05-10 MED ORDER — PROMETHAZINE HCL 25 MG PO TABS: 25.0000 mg | ORAL_TABLET | Freq: Four times a day (QID) | ORAL | Status: DC | PRN

## 2015-05-10 MED ORDER — ACETAMINOPHEN 650 MG RE SUPP: 650.0000 mg | Freq: Four times a day (QID) | RECTAL | Status: DC | PRN

## 2015-05-10 NOTE — Consult Note (Signed)
[facility] CONSULT NOTE    Date: 05/10/2015                  Patient Name:  BEKA MIKULAK  MRN: [id number]  DOB: [birth year]  Age / Sex: 45 y.o., male         PCP: Enid Derry, MD                 Service Requesting Consult: Dr. Lance Coon                 Reason for Consult: Bilateral flank pain and hematuria            History of Present Illness: Patient is a 45 y.o. male with a PMHx of gout, hyperlipidemia, hypertension, history of left gynecomastia, who was admitted to [facility] on 05/09/2015 for evaluation of bilateral flank pain. Patient reports that he had symptoms of bilateral flank pain starting approximately on Wednesday. The symptoms progressed and were associated with chills but not fever. He went to see his primary care doctor and was sent here for evaluation. He's had some associated nausea and vomiting as well. He had a CT scan of the abdomen and pelvis with IV contrast performed upon admission.  This demonstrated severe bilateral symmetric perinephric inflammatory change. There were also numerous bilateral low-attenuation renal lesions the majority of which are 5 mm or smaller. No cystitis was noted. On urinalysis hematuria was noted however pyuria was absent. The patient was started on treatment with ceftriaxone. His WBC count has improved. However renal function has worsened this a.m.  At home he is on amlodipine and losartan for blood pressure control.  Patient's father apparently had renal failure however the etiology of this is unknown to the patient   Medications: Outpatient medications: Prescriptions prior to admission  Medication Sig Dispense Refill Last Dose  . amLODipine (NORVASC) 10 MG tablet Take 1 tablet (10 mg total) by mouth daily. 30 tablet 11 05/09/2015 at Unknown time  . colchicine 0.6 MG tablet Take 0.6 mg by mouth daily. 2 pills by mouth now and then one pill one hour later (for gout flares).   prn  . losartan (COZAAR) 50 MG tablet Take  1 tablet (50 mg total) by mouth daily. 30 tablet 1 05/09/2015 at Unknown time  . traMADol (ULTRAM) 50 MG tablet Take 1 tablet (50 mg total) by mouth every 6 (six) hours as needed. 15 tablet 0 haven't started    Current medications: Current Facility-Administered Medications  Medication Dose Route Frequency Provider Last Rate Last Dose  . 0.9 %  sodium chloride infusion   Intravenous Continuous Sital Mody, MD      . acetaminophen (TYLENOL) tablet 650 mg  650 mg Oral Q6H PRN Lance Coon, MD       Or  . acetaminophen (TYLENOL) suppository 650 mg  650 mg Rectal Q6H PRN Lance Coon, MD      . amLODipine (NORVASC) tablet 10 mg  10 mg Oral Daily Lance Coon, MD   10 mg at 05/10/15 1000  . cefTRIAXone (ROCEPHIN) 1 g in dextrose 5 % 50 mL IVPB  1 g Intravenous Q24H Lance Coon, MD      . enoxaparin (LOVENOX) injection 40 mg  40 mg Subcutaneous Q0600 Lance Coon, MD   40 mg at 05/10/15 0150  . hydrALAZINE (APRESOLINE) injection 10 mg  10 mg Intravenous Q6H PRN Bettey Costa, MD      . HYDROcodone-acetaminophen (NORCO/VICODIN) 5-325 MG per tablet 1  tablet  1 tablet Oral Q6H PRN Bettey Costa, MD      . morphine 4 MG/ML injection 4 mg  4 mg Intravenous Q4H PRN Bettey Costa, MD      . ondansetron (ZOFRAN) tablet 4 mg  4 mg Oral Q6H PRN Lance Coon, MD       Or  . ondansetron [facility]) injection 4 mg  4 mg Intravenous Q6H PRN Lance Coon, MD   4 mg at 05/10/15 0747  . promethazine (PHENERGAN) tablet 25 mg  25 mg Oral Q6H PRN Bettey Costa, MD       Or  . promethazine (PHENERGAN) injection 25 mg  25 mg Intravenous Q6H PRN Bettey Costa, MD   25 mg at 05/10/15 0935      Allergies: No Known Allergies    Past Medical History: Past Medical History  Diagnosis Date  . Gout   . Gynecomastia, male left  . Eczema   . Hyperlipidemia   . Hypertension   . Snoring      Past Surgical History: Past Surgical History  Procedure Laterality Date  . No past surgeries       Family History: Family History   Problem Relation Age of Onset  . Hypertension Mother   . Kidney disease Father   . Hypertension Father   . Dementia Father   . Squamous cell carcinoma Father   . Cancer Father     SCC  . Diabetes Father   . Heart disease Father   . Cancer Brother     hodgkin's lymphoma  . Heart disease Brother 16    3 vessel CABG  . Heart disease Maternal Grandmother   . Stroke Maternal Grandmother   . Heart disease Maternal Grandfather   . Stroke Maternal Grandfather   . Heart disease Paternal Grandmother   . Heart disease Paternal Grandfather      Social History: Social History   Social History  . Marital Status: Married    Spouse Name: N/A  . Number of Children: N/A  . Years of Education: N/A   Occupational History  . Not on file.   Social History Main Topics  . Smoking status: Never Smoker   . Smokeless tobacco: Former Systems developer    Types: Chew  . Alcohol Use: No  . Drug Use: No  . Sexual Activity: Yes   Other Topics Concern  . Not on file   Social History Narrative     Review of Systems: As per HPI  Vital Signs: Blood pressure 138/81, pulse 48, temperature 97.8 F (36.6 C), temperature source Oral, resp. rate 18, height 6\' 3"  (1.905 m), weight 107.956 kg (238 lb), SpO2 95 %.  Weight trends: Filed Weights   05/09/15 1410  Weight: 107.956 kg (238 lb)    Physical Exam: General: NAD, resting in bed  Head: Normocephalic, atraumatic.  Eyes: Anicteric, EOMI  Nose: Mucous membranes moist, not inflammed, nonerythematous.  Throat: Oropharynx nonerythematous, no exudate appreciated.   Neck: Supple, trachea midline.  Lungs:  Normal respiratory effort. Clear to auscultation BL without crackles or wheezes.  Heart: RRR. S1 and S2 normal without gallop, murmur, or rubs.  Abdomen:  Bilateral flank tenderness noted, bowel sounds present, no rebound or guarding  Extremities: No pretibial edema.  Neurologic: A&O X3, Motor strength is 5/5 in the all 4 extremities  Skin: No  visible rashes, scars.    Lab results: Basic Metabolic Panel:  Recent Labs Lab 05/08/15 1535 05/09/15 1417 05/10/15 0322  NA 142 143  139  K 3.7 4.1 4.4  CL 105 108 107  CO2 30 26 25   GLUCOSE 119* 101* 110*  BUN 17 21* 27*  CREATININE 1.21 1.47* 2.30*  CALCIUM 9.2 9.5 8.8*    Liver Function Tests:  Recent Labs Lab 05/08/15 1535 05/09/15 1417  AST 71* 62*  ALT 101* 102*  ALKPHOS 82 78  BILITOT 0.5 0.3  PROT 7.4 7.8  ALBUMIN 4.1 4.6    Recent Labs Lab 05/08/15 1535 05/09/15 1417  LIPASE 24 24   No results for input(s): AMMONIA in the last 168 hours.  CBC:  Recent Labs Lab 05/08/15 1535 05/09/15 1417 05/10/15 0322  WBC 7.2 13.4* 9.6  HGB 14.1 14.3 13.7  HCT 41.8 43.3 40.0  MCV 83.8 82.8 83.8  PLT 179 200 167    Cardiac Enzymes:  Recent Labs Lab 05/08/15 1535 05/09/15 1417  TROPONINI <0.03 <0.03    BNP: Invalid input(s): POCBNP  CBG:  Recent Labs Lab 05/10/15 0139 05/10/15 1122  GLUCAP 107* 65    Microbiology: Results for orders placed or performed during the hospital encounter of 05/09/15  Urine culture     Status: None (Preliminary result)   Collection Time: 05/09/15 10:09 PM  Result Value Ref Range Status   Specimen Description URINE, RANDOM  Final   Special Requests Normal  Final   Culture NO GROWTH < 12 HOURS  Final   Report Status PENDING  Incomplete  Culture, blood (routine x 2)     Status: None (Preliminary result)   Collection Time: 05/09/15 11:24 PM  Result Value Ref Range Status   Specimen Description BLOOD RIGHT ASSIST CONTROL  Final   Special Requests BOTTLES DRAWN AEROBIC AND ANAEROBIC 7CCAERO,2CCANA  Final   Culture NO GROWTH < 12 HOURS  Final   Report Status PENDING  Incomplete  Culture, blood (routine x 2)     Status: None (Preliminary result)   Collection Time: 05/09/15 11:24 PM  Result Value Ref Range Status   Specimen Description BLOOD LEFT ASSIST CONTROL  Final   Special Requests BOTTLES DRAWN AEROBIC  AND ANAEROBIC 5CCAERO,4CCANA  Final   Culture NO GROWTH < 12 HOURS  Final   Report Status PENDING  Incomplete    Coagulation Studies: No results for input(s): LABPROT, INR in the last 72 hours.  Urinalysis:  Recent Labs  05/09/15 2209  COLORURINE STRAW*  LABSPEC 1.023  PHURINE 5.0  GLUCOSEU NEGATIVE  HGBUR 1+*  BILIRUBINUR NEGATIVE  KETONESUR NEGATIVE  PROTEINUR 100*  NITRITE NEGATIVE  LEUKOCYTESUR NEGATIVE      Imaging: Dg Chest 2 View  05/08/2015  CLINICAL DATA:  Right-sided chest pain and epigastric pain. EXAM: CHEST  2 VIEW COMPARISON:  09/15/2011 FINDINGS: Cardiomediastinal silhouette is normal. Mediastinal contours appear intact. There is no evidence of focal airspace consolidation, pleural effusion or pneumothorax. Osseous structures are without acute abnormality. Soft tissues are grossly normal. IMPRESSION: No active cardiopulmonary disease. Electronically Signed   By: Fidela Salisbury M.D.   On: 05/08/2015 16:02   Ct Abdomen Pelvis W Contrast  05/09/2015  CLINICAL DATA:  Right upper quadrant right flank pain for 2 days with nausea EXAM: CT ABDOMEN AND PELVIS WITH CONTRAST TECHNIQUE: Multidetector CT imaging of the abdomen and pelvis was performed using the standard protocol following bolus administration of intravenous contrast. CONTRAST:  166mL OMNIPAQUE IOHEXOL 300 MG/ML  SOLN COMPARISON:  None. FINDINGS: Lower chest:  Negative Hepatobiliary: 5 mm low-attenuation lesion image number 25 lateral left lobe of liver too small to  characterize possibly a cyst. Pancreas: Negative Spleen: Negative Adrenals/Urinary Tract: Normal adrenal glands. Severe bilateral perinephric inflammatory change. Several bilateral low-attenuation lesions measure between 5 and 15 mm. The largest is in the right upper pole with average attenuation value of 16. Inflammatory change tracks along the proximal ureters. Bladder is normal the there is no inflammation around the bladder. Stomach/Bowel: Normal  Vascular/Lymphatic: No significant vascular abnormalities. There is a celiac axis lymph node measuring 7 mm. There are numerous nonpathologic retroperitoneal periaortic and aortocaval lymph nodes conspicuous only by number. Reproductive: No acute finding Other: No free fluid Musculoskeletal: No acute findings IMPRESSION: There is severe bilateral symmetric perinephric inflammatory change. There are numerous bilateral low-attenuation renal lesions, the majority of which are 5 mm or smaller, likely cysts. The kidneys are normal in size at about 12cm. The bilaterality suggests a systemic process causing bilateral renal inflammation; bilateral infectious pyelonephritis is possible but less likely, and there is no evidence of cystitis. Electronically Signed   By: Skipper Cliche M.D.   On: 05/09/2015 19:46   US Abdomen Limited Ruq  05/08/2015  CLINICAL DATA:  Right upper quadrant pain for 2 days EXAM: US ABDOMEN LIMITED - RIGHT UPPER QUADRANT COMPARISON:  None. FINDINGS: Gallbladder: Incompletely distended contributing to mild increased wall thickness. No gallstones are identified. No pericholecystic fluid is noted. Common bile duct: Diameter: 5 mm. Liver: No focal lesion identified. Within normal limits in parenchymal echogenicity. IMPRESSION: No acute abnormality noted. Electronically Signed   By: Inez Catalina M.D.   On: 05/08/2015 19:57      Assessment & Plan: Pt is a 45 y.o. male with a PMHx of gout, hyperlipidemia, hypertension, history of left gynecomastia, who was admitted to [facility] on 05/09/2015 for evaluation of bilateral flank pain.  1.  Bilateral flank pain. R10.9 2.  Symmetric bilateral perinephric inflammatory change. 3.  Hematuria, proteinuria present,  no pyuria noted. 4.  Acute renal failure, exposed to contrast. 5.  Hypertension. 6.  History of gout.  Plan: The patient presents with a very interesting and unusual case. He presented with bilateral flank pain associated with nausea,  vomiting. CT scan of the abdomen and pelvis demonstrates severe bilateral symmetric perinephric inflammatory change along with numerous bilateral low-attenuation renal lesions which may potentially represent cysts. Pyelonephritis is on the differential however we should give consideration to an underlying glomerulonephritis. He was found to have hematuria and proteinuria but no pyuria was noted. Nonetheless we agree with treatment for potential pyelonephritis with ceftriaxone. We will proceed with additional extensive serologic workup and check ANA, ANCA antibodies, GBM antibodies, C3, C4 amongst other serologic testing. He may end up requiring renal biopsy however we would like to avoid this given the potential for underlying pyelonephritis. We would also like to obtain urology's opinion regarding these findings. For now we also recommend continuation of IV fluid hydration as the patient was exposed to contrast and has worsening creatinine now. We would like to thank Dr. Jannifer Franklin for the kind consultation.

## 2015-05-10 NOTE — Assessment & Plan Note (Signed)
Unusual presention, combined with chest pain, back pain; cholecystitis certainly a possibility, but also enteritis, pneumonia, flu; he is going straight to the ER for evaluation, work-up

## 2015-05-10 NOTE — H&P (Signed)
[place] [facility] [place] NAME: David [name]    MR#:  [id number]  DATE OF BIRTH:  [birth year]  DATE OF ADMISSION:  05/09/2015  PRIMARY CARE PHYSICIAN: Enid Derry, MD   REQUESTING/REFERRING PHYSICIAN: Cinda Quest, MD  CHIEF COMPLAINT:   Chief Complaint  Patient presents with  . Chest Pain    HISTORY OF PRESENT ILLNESS:  David [name]  is a 45 y.o. male who presents with right upper quadrant abdominal pain as well as right flank pain. Patient states that this pain started about 3 days ago. He initially began feeling malaise and felt like he had a "cold". However, yesterday his significant abdominal and flank pain started. He went to see an outpatient physician and was sent to the ED for concern of gallbladder pathology. In the ED he was found to have mild AK I, but right upper quadrant ultrasound was normal. He came back today because he was not improving, and his symptoms were actually getting worse. He had an episode of sweats and cold chills. CT of the abdomen and pelvis today shows bilateral renal inflammatory densities. Concern is for pyelonephritis as patient's white blood cell count is also elevated. Hospitalists were called for admission.  PAST MEDICAL HISTORY:   Past Medical History  Diagnosis Date  . Gout   . Gynecomastia, male left  . Eczema   . Hyperlipidemia   . Hypertension   . Snoring     PAST SURGICAL HISTORY:   Past Surgical History  Procedure Laterality Date  . No past surgeries      SOCIAL HISTORY:   Social History  Substance Use Topics  . Smoking status: Never Smoker   . Smokeless tobacco: Former Systems developer    Types: Chew  . Alcohol Use: No    FAMILY HISTORY:   Family History  Problem Relation Age of Onset  . Hypertension Mother   . Kidney disease Father   . Hypertension Father   . Dementia Father   . Squamous cell carcinoma Father   . Cancer Father     SCC  . Diabetes Father   . Heart disease Father    . Cancer Brother     hodgkin's lymphoma  . Heart disease Brother 47    3 vessel CABG  . Heart disease Maternal Grandmother   . Stroke Maternal Grandmother   . Heart disease Maternal Grandfather   . Stroke Maternal Grandfather   . Heart disease Paternal Grandmother   . Heart disease Paternal Grandfather     DRUG ALLERGIES:  No Known Allergies  MEDICATIONS AT HOME:   Prior to Admission medications   Medication Sig Start Date End Date Taking? Authorizing Provider  amLODipine (NORVASC) 10 MG tablet Take 1 tablet (10 mg total) by mouth daily. 03/09/15  Yes Arnetha Courser, MD  colchicine 0.6 MG tablet Take 0.6 mg by mouth daily. 2 pills by mouth now and then one pill one hour later (for gout flares).   Yes Historical Provider, MD  losartan (COZAAR) 50 MG tablet Take 1 tablet (50 mg total) by mouth daily. 03/09/15  Yes Arnetha Courser, MD  traMADol (ULTRAM) 50 MG tablet Take 1 tablet (50 mg total) by mouth every 6 (six) hours as needed. 05/08/15 05/07/16 Yes Harvest Dark, MD    REVIEW OF SYSTEMS:  Review of Systems  Constitutional: Positive for chills. Negative for fever, weight loss and malaise/fatigue.  HENT: Negative for ear pain, hearing loss and tinnitus.  Eyes: Negative for blurred vision, double vision, pain and redness.  Respiratory: Negative for cough, hemoptysis and shortness of breath.   Cardiovascular: Negative for chest pain, palpitations, orthopnea and leg swelling.  Gastrointestinal: Positive for nausea and abdominal pain. Negative for vomiting, diarrhea and constipation.  Genitourinary: Positive for flank pain. Negative for dysuria, frequency and hematuria.  Musculoskeletal: Negative for back pain, joint pain and neck pain.  Skin:       No acne, rash, or lesions  Neurological: Negative for dizziness, tremors, focal weakness and weakness.  Endo/Heme/Allergies: Negative for polydipsia. Does not bruise/bleed easily.  Psychiatric/Behavioral: Negative for depression.  The patient is not nervous/anxious and does not have insomnia.      VITAL SIGNS:   Filed Vitals:   05/09/15 2130 05/09/15 2200 05/09/15 2300 05/09/15 2330  BP: 126/75 141/84 145/91 156/92  Pulse: 69 77 72 73  Temp:      TempSrc:      Resp: 18 16 15 24   Height:      Weight:      SpO2: 97% 93% 93% 97%   Wt Readings from Last 3 Encounters:  05/09/15 107.956 kg (238 lb)  05/08/15 107.956 kg (238 lb)  05/08/15 108.319 kg (238 lb 12.8 oz)    PHYSICAL EXAMINATION:  Physical Exam  Vitals reviewed. Constitutional: He is oriented to person, place, and time. He appears well-developed and well-nourished. He appears distressed (appears uncomfortable).  HENT:  Head: Normocephalic and atraumatic.  Mouth/Throat: Oropharynx is clear and moist.  Eyes: Conjunctivae and EOM are normal. Pupils are equal, round, and reactive to light. No scleral icterus.  Neck: Normal range of motion. Neck supple. No JVD present. No thyromegaly present.  Cardiovascular: Normal rate, regular rhythm and intact distal pulses.  Exam reveals no gallop and no friction rub.   No murmur heard. Respiratory: Effort normal and breath sounds normal. No respiratory distress. He has no wheezes. He has no rales.  GI: Soft. Bowel sounds are normal. He exhibits no distension. There is tenderness (Right upper quadrant).  Musculoskeletal: Normal range of motion. He exhibits tenderness (Right flank). He exhibits no edema.  No arthritis, no gout  Lymphadenopathy:    He has no cervical adenopathy.  Neurological: He is alert and oriented to person, place, and time. No cranial nerve deficit.  No dysarthria, no aphasia  Skin: Skin is warm and dry. No rash noted. No erythema.  Psychiatric: He has a normal mood and affect. His behavior is normal. Judgment and thought content normal.    LABORATORY PANEL:   CBC  Recent Labs Lab 05/09/15 1417  WBC 13.4*  HGB 14.3  HCT 43.3  PLT 200    ------------------------------------------------------------------------------------------------------------------  Chemistries   Recent Labs Lab 05/09/15 1417  NA 143  K 4.1  CL 108  CO2 26  GLUCOSE 101*  BUN 21*  CREATININE 1.47*  CALCIUM 9.5  AST 62*  ALT 102*  ALKPHOS 78  BILITOT 0.3   ------------------------------------------------------------------------------------------------------------------  Cardiac Enzymes  Recent Labs Lab 05/09/15 1417  TROPONINI <0.03   ------------------------------------------------------------------------------------------------------------------  RADIOLOGY:  Dg Chest 2 View  05/08/2015  CLINICAL DATA:  Right-sided chest pain and epigastric pain. EXAM: CHEST  2 VIEW COMPARISON:  09/15/2011 FINDINGS: Cardiomediastinal silhouette is normal. Mediastinal contours appear intact. There is no evidence of focal airspace consolidation, pleural effusion or pneumothorax. Osseous structures are without acute abnormality. Soft tissues are grossly normal. IMPRESSION: No active cardiopulmonary disease. Electronically Signed   By: Fidela Salisbury M.D.   On: 05/08/2015  16:02   Ct Abdomen Pelvis W Contrast  05/09/2015  CLINICAL DATA:  Right upper quadrant right flank pain for 2 days with nausea EXAM: CT ABDOMEN AND PELVIS WITH CONTRAST TECHNIQUE: Multidetector CT imaging of the abdomen and pelvis was performed using the standard protocol following bolus administration of intravenous contrast. CONTRAST:  124mL OMNIPAQUE IOHEXOL 300 MG/ML  SOLN COMPARISON:  None. FINDINGS: Lower chest:  Negative Hepatobiliary: 5 mm low-attenuation lesion image number 25 lateral left lobe of liver too small to characterize possibly a cyst. Pancreas: Negative Spleen: Negative Adrenals/Urinary Tract: Normal adrenal glands. Severe bilateral perinephric inflammatory change. Several bilateral low-attenuation lesions measure between 5 and 15 mm. The largest is in the right upper  pole with average attenuation value of 16. Inflammatory change tracks along the proximal ureters. Bladder is normal the there is no inflammation around the bladder. Stomach/Bowel: Normal Vascular/Lymphatic: No significant vascular abnormalities. There is a celiac axis lymph node measuring 7 mm. There are numerous nonpathologic retroperitoneal periaortic and aortocaval lymph nodes conspicuous only by number. Reproductive: No acute finding Other: No free fluid Musculoskeletal: No acute findings IMPRESSION: There is severe bilateral symmetric perinephric inflammatory change. There are numerous bilateral low-attenuation renal lesions, the majority of which are 5 mm or smaller, likely cysts. The kidneys are normal in size at about 12cm. The bilaterality suggests a systemic process causing bilateral renal inflammation; bilateral infectious pyelonephritis is possible but less likely, and there is no evidence of cystitis. Electronically Signed   By: Skipper Cliche M.D.   On: 05/09/2015 19:46   US Abdomen Limited Ruq  05/08/2015  CLINICAL DATA:  Right upper quadrant pain for 2 days EXAM: US ABDOMEN LIMITED - RIGHT UPPER QUADRANT COMPARISON:  None. FINDINGS: Gallbladder: Incompletely distended contributing to mild increased wall thickness. No gallstones are identified. No pericholecystic fluid is noted. Common bile duct: Diameter: 5 mm. Liver: No focal lesion identified. Within normal limits in parenchymal echogenicity. IMPRESSION: No acute abnormality noted. Electronically Signed   By: Inez Catalina M.D.   On: 05/08/2015 19:57    EKG:   Orders placed or performed during the hospital encounter of 05/09/15  . EKG 12-Lead  . EKG 12-Lead  . ED EKG within 10 minutes  . ED EKG within 10 minutes    IMPRESSION AND PLAN:  Principal Problem:   Pyelonephritis - IV Rocephin started in the ED. Blood and urine cultures sent first. Nephrology was contacted by ED physician, and they felt like this was likely pyelonephritis.  We've consulted them for their assistance and continue to care for this patient. Active Problems:   AKI (acute kidney injury) ([place]) - likely from his pyelonephritis, but also possibly prerenal component from poor by mouth intake due to his nausea and malaise. We will keep him hydrated, avoid nephrotoxins, and monitor closely. Expect improvement.   Hypertension - continue home meds except for air being given his AK I.   Hyperlipidemia - not on antilipids at home, we will give him a heart healthy diet here.  All the records are reviewed and case discussed with ED provider. Management plans discussed with the patient and/or family.  DVT PROPHYLAXIS: SubQ lovenox  GI PROPHYLAXIS: None  ADMISSION STATUS: Inpatient  CODE STATUS: Full Code Status History    This patient does not have a recorded code status. Please follow your organizational policy for patients in this situation.      TOTAL TIME TAKING CARE OF THIS PATIENT: 45 minutes.    Jerod Mcquain Jaconita 05/10/2015, 12:02  AM  [facility]  Office  [phone]  CC: Primary care physician; Enid Derry, MD

## 2015-05-10 NOTE — Progress Notes (Signed)
Pt is alert and oriented, pain at start of shift treated with dilaudid, followed by n/v that was improved with phenergan after IV zofran failed. Mobile in room, wife at bedside, morphine and hydrocodone ordered for pain. Nephro consulted with patient and would like urology to evaluate. Poor appetite secondary to n/v, stable vitals, on room air, IV fluids infusing. BUN and creatine increased in the setting of decreased wbc, labs will be rechecked in am, urine sent to lab.

## 2015-05-10 NOTE — Progress Notes (Signed)
MD Mody said to discontinue telemetry

## 2015-05-10 NOTE — Progress Notes (Signed)
Notifed MD that bp is elevated and pt contiues to vomit, MD will enter orders, Telephone with Mody.

## 2015-05-10 NOTE — Progress Notes (Signed)
Notified MD that pt n/v improved with phenergan but is having pain, bp improved as well. Okay to given 2 mg morphine q4h for pain, telephone with Dr. Benjie Karvonen.

## 2015-05-10 NOTE — Assessment & Plan Note (Signed)
Marked hypertension; has not taken antihypertensives for two days and sounds to have taken decongestants; uncontrolled

## 2015-05-10 NOTE — ED Notes (Signed)
Per nursing supervisor, wait 5-32min before transporting pt to floor

## 2015-05-10 NOTE — Assessment & Plan Note (Signed)
Another cardiac risk factor; noted, not checked today

## 2015-05-10 NOTE — Progress Notes (Signed)
Notified MD that patient continues to have pain, per MD increase morphine to 4 mg q4h and hydrocodone 5/325 q6 h for moderate pain. Telephone with Liberal.

## 2015-05-10 NOTE — Progress Notes (Signed)
[place] [facility] [place] NAME: David [name]    MR#:  [id number]  DATE OF BIRTH:  [birth year]  SUBJECTIVE:     patient continues to have nausea and back pain yes REVIEW OF SYSTEMS:    Review of Systems  Constitutional: Negative for fever, chills and malaise/fatigue.  HENT: Negative for sore throat.   Eyes: Negative for blurred vision.  Respiratory: Negative for cough, hemoptysis, shortness of breath and wheezing.   Cardiovascular: Negative for chest pain, palpitations and leg swelling.  Gastrointestinal: Positive for nausea and vomiting. Negative for abdominal pain, diarrhea and blood in stool.  Genitourinary: Positive for flank pain. Negative for dysuria.  Musculoskeletal: Negative for back pain.  Neurological: Negative for dizziness, tremors and headaches.  Endo/Heme/Allergies: Does not bruise/bleed easily.    Tolerating Diet:  No due to nausea      DRUG ALLERGIES:  No Known Allergies  VITALS:  Blood pressure 179/110, pulse 81, temperature 97.8 F (36.6 C), temperature source Oral, resp. rate 18, height 6\' 3"  (1.905 m), weight 107.956 kg (238 lb), SpO2 95 %.  PHYSICAL EXAMINATION:   Physical Exam  Constitutional: He is oriented to person, place, and time and well-developed, well-nourished, and in no distress. No distress.  HENT:  Head: Normocephalic.  Eyes: No scleral icterus.  Neck: Normal range of motion. Neck supple. No JVD present. No tracheal deviation present.  Cardiovascular: Normal rate, regular rhythm and normal heart sounds.  Exam reveals no gallop and no friction rub.   No murmur heard. Pulmonary/Chest: Effort normal and breath sounds normal. No respiratory distress. He has no wheezes. He has no rales. He exhibits no tenderness.  Abdominal: Soft. Bowel sounds are normal. He exhibits no distension and no mass. There is no tenderness. There is no rebound and no guarding.  Musculoskeletal: Normal range of motion.  He exhibits no edema.  Bilteral CVA tnderness  Neurological: He is alert and oriented to person, place, and time.  Skin: Skin is warm. No rash noted. No erythema.  Psychiatric: Affect and judgment normal.      LABORATORY PANEL:   CBC  Recent Labs Lab 05/10/15 0322  WBC 9.6  HGB 13.7  HCT 40.0  PLT 167   ------------------------------------------------------------------------------------------------------------------  Chemistries   Recent Labs Lab 05/09/15 1417 05/10/15 0322  NA 143 139  K 4.1 4.4  CL 108 107  CO2 26 25  GLUCOSE 101* 110*  BUN 21* 27*  CREATININE 1.47* 2.30*  CALCIUM 9.5 8.8*  AST 62*  --   ALT 102*  --   ALKPHOS 78  --   BILITOT 0.3  --    ------------------------------------------------------------------------------------------------------------------  Cardiac Enzymes  Recent Labs Lab 05/08/15 1535 05/09/15 1417  TROPONINI <0.03 <0.03   ------------------------------------------------------------------------------------------------------------------  RADIOLOGY:  Dg Chest 2 View  05/08/2015  CLINICAL DATA:  Right-sided chest pain and epigastric pain. EXAM: CHEST  2 VIEW COMPARISON:  09/15/2011 FINDINGS: Cardiomediastinal silhouette is normal. Mediastinal contours appear intact. There is no evidence of focal airspace consolidation, pleural effusion or pneumothorax. Osseous structures are without acute abnormality. Soft tissues are grossly normal. IMPRESSION: No active cardiopulmonary disease. Electronically Signed   By: Fidela Salisbury M.D.   On: 05/08/2015 16:02   Ct Abdomen Pelvis W Contrast  05/09/2015  CLINICAL DATA:  Right upper quadrant right flank pain for 2 days with nausea EXAM: CT ABDOMEN AND PELVIS WITH CONTRAST TECHNIQUE: Multidetector CT imaging of the abdomen and pelvis was performed using the standard protocol  following bolus administration of intravenous contrast. CONTRAST:  117mL OMNIPAQUE IOHEXOL 300 MG/ML  SOLN  COMPARISON:  None. FINDINGS: Lower chest:  Negative Hepatobiliary: 5 mm low-attenuation lesion image number 25 lateral left lobe of liver too small to characterize possibly a cyst. Pancreas: Negative Spleen: Negative Adrenals/Urinary Tract: Normal adrenal glands. Severe bilateral perinephric inflammatory change. Several bilateral low-attenuation lesions measure between 5 and 15 mm. The largest is in the right upper pole with average attenuation value of 16. Inflammatory change tracks along the proximal ureters. Bladder is normal the there is no inflammation around the bladder. Stomach/Bowel: Normal Vascular/Lymphatic: No significant vascular abnormalities. There is a celiac axis lymph node measuring 7 mm. There are numerous nonpathologic retroperitoneal periaortic and aortocaval lymph nodes conspicuous only by number. Reproductive: No acute finding Other: No free fluid Musculoskeletal: No acute findings IMPRESSION: There is severe bilateral symmetric perinephric inflammatory change. There are numerous bilateral low-attenuation renal lesions, the majority of which are 5 mm or smaller, likely cysts. The kidneys are normal in size at about 12cm. The bilaterality suggests a systemic process causing bilateral renal inflammation; bilateral infectious pyelonephritis is possible but less likely, and there is no evidence of cystitis. Electronically Signed   By: Skipper Cliche M.D.   On: 05/09/2015 19:46   US Abdomen Limited Ruq  05/08/2015  CLINICAL DATA:  Right upper quadrant pain for 2 days EXAM: US ABDOMEN LIMITED - RIGHT UPPER QUADRANT COMPARISON:  None. FINDINGS: Gallbladder: Incompletely distended contributing to mild increased wall thickness. No gallstones are identified. No pericholecystic fluid is noted. Common bile duct: Diameter: 5 mm. Liver: No focal lesion identified. Within normal limits in parenchymal echogenicity. IMPRESSION: No acute abnormality noted. Electronically Signed   By: Inez Catalina M.D.   On:  05/08/2015 19:57     ASSESSMENT AND PLAN:     45 year old male with history of essential hypertensin with presented with abdominal pain  And found to have severe bilateral symmetric perinephric affect inflammatory changes on CT scan.   1.  Acute pyelonephritis: it appears that  The abnormal CT scan findings ar consistent with acute pyelonephrits as per consultation with nephrology. For now continue ROCEPHIN and follow up on urine culture.  2. Nausea with vomiting possibly from above findings or from worsening renal function. Continue supportive care.   3. ARF: Continue IVF and follow up on NEPHROLOGY consultation. Hold nephrotoxic agents.  4. Accelerated HTN: He cannot take in oral medications at this time and theerfore I have prescribed IV PRN HYDRALAZINE with parameters.        Management plans discussed with the patient and he is in agreement.  CODE STATUS: FULL  TOTAL TIME TAKING CARE OF THIS PATIENT: 30 minutes.     POSSIBLE D/C 2-3 days, DEPENDING ON CLINICAL CONDITION.   Letta Cargile M.D on 05/10/2015 at 9:31 AM  Between 7am to 6pm - Pager - [phone] After 6pm go to [email or web address] - password [email or web address] [facility]  Office  [phone]  CC: Primary care physician; Enid Derry, MD  Note: This dictation was prepared with Dragon dictation along with smaller phrase technology. Any transcriptional errors that result from this process are unintentional.

## 2015-05-10 NOTE — Progress Notes (Signed)
Pt is asleep from phenergan, patietns wife request that pt is not disturbed until he wakes up. Will recollect bp and start IV fluids when patient awakes

## 2015-05-10 NOTE — ED Notes (Signed)
Pt's bed assigned for a half hour, floor called regarding when bed will be ready.  Floor states they just had a nurse come in and are redoing assignments and will call this nurse shortly when assignments final.

## 2015-05-10 NOTE — Assessment & Plan Note (Signed)
Chest pain, abdominal pain, back pain; unusual presentation; the most worrisome sx that I can do anything about until EMS arrives is to treat for possible angina, given his abnormal EKG, strong family hx of coronary artery disease, and poorly controlled HTN combined with use of decongestants over the last 2 days; NTG and asprin and oxygen given here, EMS taking him to ER for evaluation and work-up; ddx was discussed with patient including flu, pneumonia, cholecystitis, but the only thing I could treat in my office again was the possibility of angina as cause of symptoms; aware this is atypical presentation for heart attack; he left in stable condition with EMS

## 2015-05-11 DIAGNOSIS — N12 Tubulo-interstitial nephritis, not specified as acute or chronic: Secondary | ICD-10-CM

## 2015-05-11 LAB — URINE CULTURE: SPECIAL REQUESTS: NORMAL

## 2015-05-11 LAB — GLUCOSE, CAPILLARY
GLUCOSE-CAPILLARY: 90 mg/dL (ref 65–99)
GLUCOSE-CAPILLARY: 89 mg/dL (ref 65–99)
Glucose-Capillary: 116 mg/dL — ABNORMAL HIGH (ref 65–99)
Glucose-Capillary: 92 mg/dL (ref 65–99)

## 2015-05-11 LAB — BASIC METABOLIC PANEL
Anion gap: 5 (ref 5–15)
BUN: 23 mg/dL — ABNORMAL HIGH (ref 6–20)
CALCIUM: 8.4 mg/dL — AB (ref 8.9–10.3)
CO2: 27 mmol/L (ref 22–32)
CREATININE: 2.13 mg/dL — AB (ref 0.61–1.24)
Chloride: 109 mmol/L (ref 101–111)
GFR, EST AFRICAN AMERICAN: 41 mL/min — AB (ref >60)
GFR, EST NON AFRICAN AMERICAN: 35 mL/min — AB (ref >60)
Glucose, Bld: 103 mg/dL — ABNORMAL HIGH (ref 65–99)
Potassium: 4.4 mmol/L (ref 3.5–5.1)
SODIUM: 141 mmol/L (ref 135–145)

## 2015-05-11 LAB — HEPATITIS B SURFACE ANTIGEN: Hepatitis B Surface Ag: NEGATIVE

## 2015-05-11 MED ORDER — SODIUM CHLORIDE 0.9 % IV SOLN
INTRAVENOUS | Status: AC
  Administered 2015-05-11: 13:00:00 via INTRAVENOUS

## 2015-05-11 MED ORDER — HYDRALAZINE HCL 25 MG PO TABS
25.0000 mg | ORAL_TABLET | Freq: Two times a day (BID) | ORAL | Status: DC
  Administered 2015-05-11 – 2015-05-12 (×3): 25 mg via ORAL
  Filled 2015-05-11 (×2): qty 1

## 2015-05-11 MED ORDER — MORPHINE SULFATE (PF) 4 MG/ML IV SOLN
4.0000 mg | INTRAVENOUS | Status: DC | PRN
  Administered 2015-05-11 – 2015-05-13 (×4): 4 mg via INTRAVENOUS
  Filled 2015-05-11 (×4): qty 1

## 2015-05-11 NOTE — Progress Notes (Signed)
[place] at [place] NAME: David [name]    MR#:  [id number]  DATE OF BIRTH:  08/19/1970  SUBJECTIVE:   Patient now has a headache. His flank pain is improved. He no longer has nausea and vomiting. This was thought to be due to the pain medication he received. REVIEW OF SYSTEMS:    Review of Systems  Constitutional: Negative for fever, chills and malaise/fatigue.  HENT: Negative for sore throat.   Eyes: Negative for blurred vision.  Respiratory: Negative for cough, hemoptysis, shortness of breath and wheezing.   Cardiovascular: Negative for chest pain, palpitations and leg swelling.  Gastrointestinal: Negative for abdominal pain, diarrhea and blood in stool.  Genitourinary: Positive for flank pain. Negative for dysuria.  Musculoskeletal: Negative for back pain.  Neurological: Positive for headaches. Negative for dizziness and tremors.  Endo/Heme/Allergies: Does not bruise/bleed easily.    Tolerating Diet:  Yes    DRUG ALLERGIES:  No Known Allergies  VITALS:  Blood pressure 171/81, pulse 64, temperature 98.1 F (36.7 C), temperature source Oral, resp. rate 18, height 6\' 3"  (1.905 m), weight 107.956 kg (238 lb), SpO2 96 %.  PHYSICAL EXAMINATION:   Physical Exam  Constitutional: He is oriented to person, place, and time and well-developed, well-nourished, and in no distress. No distress.  HENT:  Head: Normocephalic.  Eyes: No scleral icterus.  Neck: Normal range of motion. Neck supple. No JVD present. No tracheal deviation present.  Cardiovascular: Normal rate, regular rhythm and normal heart sounds.  Exam reveals no gallop and no friction rub.   No murmur heard. Pulmonary/Chest: Effort normal and breath sounds normal. No respiratory distress. He has no wheezes. He has no rales. He exhibits no tenderness.  Abdominal: Soft. Bowel sounds are normal. He exhibits no distension and no mass. There is no tenderness. There is no  rebound and no guarding.  Musculoskeletal: Normal range of motion. He exhibits no edema.  Bilteral CVA tnderness  Neurological: He is alert and oriented to person, place, and time.  Skin: Skin is warm. No rash noted. No erythema.  Psychiatric: Affect and judgment normal.      LABORATORY PANEL:   CBC  Recent Labs Lab 05/10/15 0322  WBC 9.6  HGB 13.7  HCT 40.0  PLT 167   ------------------------------------------------------------------------------------------------------------------  Chemistries   Recent Labs Lab 05/09/15 1417  05/11/15 0345  NA 143  < > 141  K 4.1  < > 4.4  CL 108  < > 109  CO2 26  < > 27  GLUCOSE 101*  < > 103*  BUN 21*  < > 23*  CREATININE 1.47*  < > 2.13*  CALCIUM 9.5  < > 8.4*  AST 62*  --   --   ALT 102*  --   --   ALKPHOS 78  --   --   BILITOT 0.3  --   --   < > = values in this interval not displayed. ------------------------------------------------------------------------------------------------------------------  Cardiac Enzymes  Recent Labs Lab 05/08/15 1535 05/09/15 1417  TROPONINI <0.03 <0.03   ------------------------------------------------------------------------------------------------------------------  RADIOLOGY:  Ct Abdomen Pelvis W Contrast  05/09/2015  CLINICAL DATA:  Right upper quadrant right flank pain for 2 days with nausea EXAM: CT ABDOMEN AND PELVIS WITH CONTRAST TECHNIQUE: Multidetector CT imaging of the abdomen and pelvis was performed using the standard protocol following bolus administration of intravenous contrast. CONTRAST:  111mL OMNIPAQUE IOHEXOL 300 MG/ML  SOLN COMPARISON:  None. FINDINGS: Lower chest:  Negative Hepatobiliary: 5 mm low-attenuation lesion image number 25 lateral left lobe of liver too small to characterize possibly a cyst. Pancreas: Negative Spleen: Negative Adrenals/Urinary Tract: Normal adrenal glands. Severe bilateral perinephric inflammatory change. Several bilateral low-attenuation  lesions measure between 5 and 15 mm. The largest is in the right upper pole with average attenuation value of 16. Inflammatory change tracks along the proximal ureters. Bladder is normal the there is no inflammation around the bladder. Stomach/Bowel: Normal Vascular/Lymphatic: No significant vascular abnormalities. There is a celiac axis lymph node measuring 7 mm. There are numerous nonpathologic retroperitoneal periaortic and aortocaval lymph nodes conspicuous only by number. Reproductive: No acute finding Other: No free fluid Musculoskeletal: No acute findings IMPRESSION: There is severe bilateral symmetric perinephric inflammatory change. There are numerous bilateral low-attenuation renal lesions, the majority of which are 5 mm or smaller, likely cysts. The kidneys are normal in size at about 12cm. The bilaterality suggests a systemic process causing bilateral renal inflammation; bilateral infectious pyelonephritis is possible but less likely, and there is no evidence of cystitis. Electronically Signed   By: Skipper Cliche M.D.   On: 05/09/2015 19:46     ASSESSMENT AND PLAN:     45 year old male with history of essential hypertensin with presented with abdominal pain  and found to have severe bilateral symmetric perinephric inflammatory changes on CT scan.   1.  Bilateral perinephric inflammatory changes with the possibility of Acute pyelonephritis:  the etiology of bilateral perinephric inflammatory changes is unclear at this time. It is possible that the patient has acute pyelonephritis. Patient is on Rocephin. Urine culture is negative to date. Urology consultation has been placed. Patient may benefit from a noncontrasted CT to better evaluate the kidneys and/or cystoscopy. I will await urology consultation and recommendations for further management and appreciate nephrology consultation. Follow up on HIV serologies  2. Nausea with vomiting old and this has resolved and was thought to be due to  Dilaudid .   3. ARF: Renal failure is improving. This is suspected to be due to prerenal azotemia and possible contrast. Creatinine is improving. Appreciate nephrology consultation. Patient is being worked up for [employer].  4. Accelerated hypertension: Blood pressure is still elevated and therefore I will add hydralazine 25 minutes by mouth twice a day Continue Norvasc and continue to monitor blood pressure.      Management plans discussed with the patient and he is in agreement.  CODE STATUS: FULL  TOTAL TIME TAKING CARE OF THIS PATIENT: 29 minutes.     POSSIBLE D/C 2-3 days, DEPENDING ON CLINICAL CONDITION.   Gaylin Osoria M.D on 05/11/2015 at 10:14 AM  Between 7am to 6pm - Pager - [phone] After 6pm go to [email or web address] - password [email or web address] [facility]  Office  [phone]  CC: Primary care physician; Enid Derry, MD  Note: This dictation was prepared with Dragon dictation along with smaller phrase technology. Any transcriptional errors that result from this process are unintentional.

## 2015-05-11 NOTE — Consult Note (Signed)
Urology Consult  I have been asked to see the patient by Dr. Holley Raring, for evaluation and management of flank pain, bilateral perinephric stranding, acute renal failure  Chief Complaint: Flank pain  History of Present Illness: David [name] is a 45 y.o. year old admitted on 05/09/15 with right worse than left bilateral flank pain, nausea, vomiting without fevers found to have significant perinephric stranding around both kidneys in the absence of pyuria, voiding symptoms, fevers, or positive blood or urine cultures.  He did have mild leukocytosis to 13 which has improved as well.  He reports this as been going on for about 4 days and happened last month as well but resolved spontaneously.    CT abd/ pelvis w/ contrast on 05/09/15 shows normal 12 cm kidneys with multiple low-attenuating lesions bilaterally (cyst vs. Early abscess) with significant bilateral perinephric stranding including around the proximal ureters without hydronephrosis.    His Cr was also rising until this AM with peak of 2.3 following administration of IV contrast and vomiting.  Renal function improved slightly today.    He does complain of night sweats over the past month but no weight loss.   Family history of ESRD in father, unclear etiology.    No travel history.  No history of substance abuse or IVDU.      Past Medical History  Diagnosis Date  . Gout   . Gynecomastia, male left  . Eczema   . Hyperlipidemia   . Hypertension   . Snoring     Past Surgical History  Procedure Laterality Date  . No past surgeries      Home Medications:    Medication List    ASK your doctor about these medications        amLODipine 10 MG tablet  Commonly known as:  NORVASC  Take 1 tablet (10 mg total) by mouth daily.     colchicine 0.6 MG tablet  Take 0.6 mg by mouth daily. 2 pills by mouth now and then one pill one hour later (for gout flares).     losartan 50 MG tablet  Commonly known as:  COZAAR  Take 1 tablet  (50 mg total) by mouth daily.     traMADol 50 MG tablet  Commonly known as:  ULTRAM  Take 1 tablet (50 mg total) by mouth every 6 (six) hours as needed.        Allergies: No Known Allergies  Family History  Problem Relation Age of Onset  . Hypertension Mother   . Kidney disease Father   . Hypertension Father   . Dementia Father   . Squamous cell carcinoma Father   . Cancer Father     SCC  . Diabetes Father   . Heart disease Father   . Cancer Brother     hodgkin's lymphoma  . Heart disease Brother 52    3 vessel CABG  . Heart disease Maternal Grandmother   . Stroke Maternal Grandmother   . Heart disease Maternal Grandfather   . Stroke Maternal Grandfather   . Heart disease Paternal Grandmother   . Heart disease Paternal Grandfather     Social History:  reports that he has never smoked. He has quit using smokeless tobacco. His smokeless tobacco use included Chew. He reports that he does not drink alcohol or use illicit drugs.  ROS: A complete review of systems was performed.  All systems are negative except for pertinent findings as noted other than headache and URI  symptoms.    Physical Exam:  Vital signs in last 24 hours: Temp:  [97.8 F (36.6 C)-98.6 F (37 C)] 98.1 F (36.7 C) (01/23 0426) Pulse Rate:  [48-81] 59 (01/23 0426) Resp:  [18] 18 (01/23 0426) BP: (135-179)/(71-110) 150/85 mmHg (01/23 0426) SpO2:  [94 %-97 %] 94 % (01/23 0426) Constitutional:  Alert and oriented, No acute distress HEENT: [place] AT, moist mucus membranes.  Trachea midline, no masses Cardiovascular: Regular rate and rhythm, no clubbing, cyanosis, or edema. Respiratory: Normal respiratory effort, lungs clear bilaterally Breast: Bilateral gynecomastia noted.   GI: Abdomen is soft, mild right upper quadrant tenderness, nondistended, no abdominal masses GU: R > L CVA tenderness.  Normal scrotum with bilateral descended testicles, no masses, nontender.  Circumcised phallus with orthotopic  patient meatus w/o discharge.   Skin: No rashes, bruises or suspicious lesions Lymph: No cervical or inguinal adenopathy Neurologic: Grossly intact, no focal deficits, moving all 4 extremities Psychiatric: Normal mood and affect   Laboratory Data:   Recent Labs  05/08/15 1535 05/09/15 1417 05/10/15 0322  WBC 7.2 13.4* 9.6  HGB 14.1 14.3 13.7  HCT 41.8 43.3 40.0    Recent Labs  05/09/15 1417 05/10/15 0322 05/11/15 0345  NA 143 139 141  K 4.1 4.4 4.4  CL 108 107 109  CO2 26 25 27   GLUCOSE 101* 110* 103*  BUN 21* 27* 23*  CREATININE 1.47* 2.30* 2.13*  CALCIUM 9.5 8.8* 8.4*   No results for input(s): LABPT, INR in the last 72 hours. No results for input(s): LABURIN in the last 72 hours. Results for orders placed or performed during the hospital encounter of 05/09/15  Urine culture     Status: None (Preliminary result)   Collection Time: 05/09/15 10:09 PM  Result Value Ref Range Status   Specimen Description URINE, RANDOM  Final   Special Requests Normal  Final   Culture NO GROWTH < 12 HOURS  Final   Report Status PENDING  Incomplete  Culture, blood (routine x 2)     Status: None (Preliminary result)   Collection Time: 05/09/15 11:24 PM  Result Value Ref Range Status   Specimen Description BLOOD RIGHT ASSIST CONTROL  Final   Special Requests BOTTLES DRAWN AEROBIC AND ANAEROBIC 7CCAERO,2CCANA  Final   Culture NO GROWTH < 12 HOURS  Final   Report Status PENDING  Incomplete  Culture, blood (routine x 2)     Status: None (Preliminary result)   Collection Time: 05/09/15 11:24 PM  Result Value Ref Range Status   Specimen Description BLOOD LEFT ASSIST CONTROL  Final   Special Requests BOTTLES DRAWN AEROBIC AND ANAEROBIC 5CCAERO,4CCANA  Final   Culture NO GROWTH < 12 HOURS  Final   Report Status PENDING  Incomplete     Radiologic Imaging: Ct Abdomen Pelvis W Contrast  05/09/2015  CLINICAL DATA:  Right upper quadrant right flank pain for 2 days with nausea EXAM: CT  ABDOMEN AND PELVIS WITH CONTRAST TECHNIQUE: Multidetector CT imaging of the abdomen and pelvis was performed using the standard protocol following bolus administration of intravenous contrast. CONTRAST:  19mL OMNIPAQUE IOHEXOL 300 MG/ML  SOLN COMPARISON:  None. FINDINGS: Lower chest:  Negative Hepatobiliary: 5 mm low-attenuation lesion image number 25 lateral left lobe of liver too small to characterize possibly a cyst. Pancreas: Negative Spleen: Negative Adrenals/Urinary Tract: Normal adrenal glands. Severe bilateral perinephric inflammatory change. Several bilateral low-attenuation lesions measure between 5 and 15 mm. The largest is in the right upper pole with average  attenuation value of 16. Inflammatory change tracks along the proximal ureters. Bladder is normal the there is no inflammation around the bladder. Stomach/Bowel: Normal Vascular/Lymphatic: No significant vascular abnormalities. There is a celiac axis lymph node measuring 7 mm. There are numerous nonpathologic retroperitoneal periaortic and aortocaval lymph nodes conspicuous only by number. Reproductive: No acute finding Other: No free fluid Musculoskeletal: No acute findings IMPRESSION: There is severe bilateral symmetric perinephric inflammatory change. There are numerous bilateral low-attenuation renal lesions, the majority of which are 5 mm or smaller, likely cysts. The kidneys are normal in size at about 12cm. The bilaterality suggests a systemic process causing bilateral renal inflammation; bilateral infectious pyelonephritis is possible but less likely, and there is no evidence of cystitis. Electronically Signed   By: Skipper Cliche M.D.   On: 05/09/2015 19:46    Impression/Assessment:   1) Flank pain- presumably related to perinephric/ renal process.  Question if this is infectious vs. Inflammatory vs. Malignant given atypical presentation without fevers, cystitis, identifiable organism of culture (blood and urine).  DDx includes  hematogenous pyelonephritis (no risk factors including no h/o IVDU), septic emboli, TB, lymphoma, inflammatory of unclear etiology.    2) Bilateral perinephric stranding- as above  3) Acute renal failure- ? Related to IV contrast with dehydration or intrinsic renal disease.  No evidence of obstructive process on CT scan.  Cr stabilizing, nephrology following.    4) Microscopic hematuria- on UA, no sign of infection.  Will likely need outpatient work up including cystoscopy if microscopic hematuria persistent on UA as outpatient.    Plan:  -PPD placed yesterday -f/u HIV, other serologies ordered by nephrology -f/u blood/ urine culture although negative to take -continue ceftriaxone for presumed pyelonephritis although clinic picture not entirely consistent with this diagnosis -continue to trend Cr/ avoid nephrotoxic agents -plan for repeat imaging via noncontrast CT scan vs. Renal ultraound tomororrow vs Wednesday, will decide based on clinical improvement -patient will need outpatient follow up with office cystoscopy for completion of microscopic hematuria work up  05/11/2015, 7:46 AM  Hollice Espy,  MD   Thank you for involving me in this patient's care, will continue to follow along. Please page with any further questions or concerns, happy to discuss case further.

## 2015-05-11 NOTE — Progress Notes (Signed)
[facility]  ROUNDING NOTE   Subjective:   Family at bedside.  Patient continues to complain of flank pain Now with headache as well.   Objective:  Vital signs in last 24 hours:  Temp:  [98.1 F (36.7 C)-98.6 F (37 C)] 98.1 F (36.7 C) (01/23 0758) Pulse Rate:  [59-66] 64 (01/23 0758) Resp:  [18] 18 (01/23 0758) BP: (135-171)/(71-85) 171/81 mmHg (01/23 0758) SpO2:  [94 %-97 %] 96 % (01/23 0758)  Weight change:  Filed Weights   05/09/15 1410  Weight: 107.956 kg (238 lb)    Intake/Output: I/O last 3 completed shifts: In: 2158 [P.O.:240; I.V.:1868; IV Piggyback:50] Out: 2025 [Urine:2025]   Intake/Output this shift:     Physical Exam: General: NAD, sitting up in bed.   Head: Normocephalic, atraumatic. Moist oral mucosal membranes  Eyes: Anicteric, PERRL  Neck: Supple, trachea midline  Lungs:  Clear to auscultation  Heart: Regular rate and rhythm  Abdomen:  Soft, nontender,   Extremities: No peripheral edema.  Neurologic: Nonfocal, moving all four extremities  Skin: No lesions       Basic Metabolic Panel:  Recent Labs Lab 05/08/15 1535 05/09/15 1417 05/10/15 0322 05/11/15 0345  NA 142 143 139 141  K 3.7 4.1 4.4 4.4  CL 105 108 107 109  CO2 30 26 25 27   GLUCOSE 119* 101* 110* 103*  BUN 17 21* 27* 23*  CREATININE 1.21 1.47* 2.30* 2.13*  CALCIUM 9.2 9.5 8.8* 8.4*    Liver Function Tests:  Recent Labs Lab 05/08/15 1535 05/09/15 1417  AST 71* 62*  ALT 101* 102*  ALKPHOS 82 78  BILITOT 0.5 0.3  PROT 7.4 7.8  ALBUMIN 4.1 4.6    Recent Labs Lab 05/08/15 1535 05/09/15 1417  LIPASE 24 24   No results for input(s): AMMONIA in the last 168 hours.  CBC:  Recent Labs Lab 05/08/15 1535 05/09/15 1417 05/10/15 0322  WBC 7.2 13.4* 9.6  HGB 14.1 14.3 13.7  HCT 41.8 43.3 40.0  MCV 83.8 82.8 83.8  PLT 179 200 167    Cardiac Enzymes:  Recent Labs Lab 05/08/15 1535 05/09/15 1417  TROPONINI <0.03 <0.03    BNP: Invalid  input(s): POCBNP  CBG:  Recent Labs Lab 05/10/15 1122 05/10/15 1517 05/10/15 2112 05/11/15 0757 05/11/15 1128  GLUCAP 82 103* 99 90 116*    Microbiology: Results for orders placed or performed during the hospital encounter of 05/09/15  Urine culture     Status: None   Collection Time: 05/09/15 10:09 PM  Result Value Ref Range Status   Specimen Description URINE, RANDOM  Final   Special Requests Normal  Final   Culture 4,000 COLONIES/mL INSIGNIFICANT GROWTH  Final   Report Status 05/11/2015 FINAL  Final  Culture, blood (routine x 2)     Status: None (Preliminary result)   Collection Time: 05/09/15 11:24 PM  Result Value Ref Range Status   Specimen Description BLOOD RIGHT ASSIST CONTROL  Final   Special Requests BOTTLES DRAWN AEROBIC AND ANAEROBIC 7CCAERO,2CCANA  Final   Culture NO GROWTH 2 DAYS  Final   Report Status PENDING  Incomplete  Culture, blood (routine x 2)     Status: None (Preliminary result)   Collection Time: 05/09/15 11:24 PM  Result Value Ref Range Status   Specimen Description BLOOD LEFT ASSIST CONTROL  Final   Special Requests BOTTLES DRAWN AEROBIC AND ANAEROBIC 5CCAERO,4CCANA  Final   Culture NO GROWTH 2 DAYS  Final   Report Status PENDING  Incomplete    Coagulation Studies: No results for input(s): LABPROT, INR in the last 72 hours.  Urinalysis:  Recent Labs  05/09/15 2209  COLORURINE STRAW*  LABSPEC 1.023  PHURINE 5.0  GLUCOSEU NEGATIVE  HGBUR 1+*  BILIRUBINUR NEGATIVE  KETONESUR NEGATIVE  PROTEINUR 100*  NITRITE NEGATIVE  LEUKOCYTESUR NEGATIVE      Imaging: Ct Abdomen Pelvis W Contrast  05/09/2015  CLINICAL DATA:  Right upper quadrant right flank pain for 2 days with nausea EXAM: CT ABDOMEN AND PELVIS WITH CONTRAST TECHNIQUE: Multidetector CT imaging of the abdomen and pelvis was performed using the standard protocol following bolus administration of intravenous contrast. CONTRAST:  169mL OMNIPAQUE IOHEXOL 300 MG/ML  SOLN COMPARISON:   None. FINDINGS: Lower chest:  Negative Hepatobiliary: 5 mm low-attenuation lesion image number 25 lateral left lobe of liver too small to characterize possibly a cyst. Pancreas: Negative Spleen: Negative Adrenals/Urinary Tract: Normal adrenal glands. Severe bilateral perinephric inflammatory change. Several bilateral low-attenuation lesions measure between 5 and 15 mm. The largest is in the right upper pole with average attenuation value of 16. Inflammatory change tracks along the proximal ureters. Bladder is normal the there is no inflammation around the bladder. Stomach/Bowel: Normal Vascular/Lymphatic: No significant vascular abnormalities. There is a celiac axis lymph node measuring 7 mm. There are numerous nonpathologic retroperitoneal periaortic and aortocaval lymph nodes conspicuous only by number. Reproductive: No acute finding Other: No free fluid Musculoskeletal: No acute findings IMPRESSION: There is severe bilateral symmetric perinephric inflammatory change. There are numerous bilateral low-attenuation renal lesions, the majority of which are 5 mm or smaller, likely cysts. The kidneys are normal in size at about 12cm. The bilaterality suggests a systemic process causing bilateral renal inflammation; bilateral infectious pyelonephritis is possible but less likely, and there is no evidence of cystitis. Electronically Signed   By: Skipper Cliche M.D.   On: 05/09/2015 19:46     Medications:   . sodium chloride 150 mL/hr at 05/11/15 1233   . amLODipine  10 mg Oral Daily  . cefTRIAXone (ROCEPHIN)  IV  1 g Intravenous Q24H  . enoxaparin (LOVENOX) injection  40 mg Subcutaneous Q0600  . hydrALAZINE  25 mg Oral BID  . tuberculin  5 Units Intradermal Once   acetaminophen **OR** acetaminophen, hydrALAZINE, HYDROcodone-acetaminophen, morphine injection, ondansetron **OR** ondansetron (ZOFRAN) IV, promethazine **OR** promethazine  Assessment/ Plan:  [name] is a 45 y.o. white  male with  gout, hyperlipidemia, hypertension, history of left gynecomastia, who was admitted to [facility] on 05/09/2015   1. Acute Renal Failure with hematuria: baseline creatinine of 0.9. Contrast exposure on 1/21 however creatinine was already trending upward. Suspect ATN from contrast induced nephropathy and ATN from sepsis/pyelonephritis.  - Creatinine improving along with urine output. Continue IV fluids - Renally dose medication - Pending serologic work up for glomerulonephritis.  - Monitor daily for dialysis need.   2. Urinary tract infection/pyelonephritis: cultures without significant growth. Wbc trended down.  - ceftriaxone.   3. Multiple renal cysts: appreciate urology input.  - scan scheduled for tomorrow.   4. Hypertension: blood pressure currently at goal. Home regimen of amlodipine and losartan. Holding losartan due to acute renal failure.  5. Gout: remote history.  - check uric acid when stable.    LOS: [place], [place] 1/23/20173:34 PM

## 2015-05-12 ENCOUNTER — Inpatient Hospital Stay: Payer: BLUE CROSS/BLUE SHIELD

## 2015-05-12 LAB — BASIC METABOLIC PANEL
Anion gap: 8 (ref 5–15)
BUN: 16 mg/dL (ref 6–20)
CALCIUM: 8.9 mg/dL (ref 8.9–10.3)
CO2: 26 mmol/L (ref 22–32)
CREATININE: 1.43 mg/dL — AB (ref 0.61–1.24)
Chloride: 108 mmol/L (ref 101–111)
GFR calc non Af Amer: 57 mL/min — ABNORMAL LOW (ref >60)
Glucose, Bld: 97 mg/dL (ref 65–99)
Potassium: 3.9 mmol/L (ref 3.5–5.1)
Sodium: 142 mmol/L (ref 135–145)

## 2015-05-12 LAB — GLUCOSE, CAPILLARY
GLUCOSE-CAPILLARY: 98 mg/dL (ref 65–99)
Glucose-Capillary: 80 mg/dL (ref 65–99)
Glucose-Capillary: 80 mg/dL (ref 65–99)
Glucose-Capillary: 86 mg/dL (ref 65–99)

## 2015-05-12 MED ORDER — SENNA 8.6 MG PO TABS
1.0000 | ORAL_TABLET | Freq: Every day | ORAL | Status: DC
  Administered 2015-05-12 – 2015-05-14 (×3): 8.6 mg via ORAL
  Filled 2015-05-12 (×3): qty 1

## 2015-05-12 MED ORDER — HYDRALAZINE HCL 50 MG PO TABS
50.0000 mg | ORAL_TABLET | Freq: Two times a day (BID) | ORAL | Status: DC
  Administered 2015-05-12 – 2015-05-13 (×3): 50 mg via ORAL
  Filled 2015-05-12 (×3): qty 1

## 2015-05-12 NOTE — Progress Notes (Signed)
Patient's up and ambulating in hallway. Gait steady.

## 2015-05-12 NOTE — Progress Notes (Addendum)
Urology Consult Follow Up  Subjective: Renal function continues to improve today. Flank pain also improving. Lengthy discussion today about patient's previous pain. Patient also notes that he has had a macular rash on his back and a spot on his left knee over the past month as well.  Voiding without difficulty. No fevers. After vomiting is resolved.  Anti-infectives: Anti-infectives    Start     Dose/Rate Route Frequency Ordered Stop   05/10/15 2300  cefTRIAXone (ROCEPHIN) 1 g in dextrose 5 % 50 mL IVPB     1 g 100 mL/hr over 30 Minutes Intravenous Every 24 hours 05/10/15 0100     05/09/15 2315  cefTRIAXone (ROCEPHIN) 1 g in dextrose 5 % 50 mL IVPB     1 g 100 mL/hr over 30 Minutes Intravenous  Once 05/09/15 2309 05/09/15 2352      Current Facility-Administered Medications  Medication Dose Route Frequency Provider Last Rate Last Dose  . acetaminophen (TYLENOL) tablet 650 mg  650 mg Oral Q6H PRN Lance Coon, MD   650 mg at 05/11/15 1232   Or  . acetaminophen (TYLENOL) suppository 650 mg  650 mg Rectal Q6H PRN Lance Coon, MD      . amLODipine (NORVASC) tablet 10 mg  10 mg Oral Daily Lance Coon, MD   10 mg at 05/12/15 1047  . cefTRIAXone (ROCEPHIN) 1 g in dextrose 5 % 50 mL IVPB  1 g Intravenous Q24H Lance Coon, MD   1 g at 05/11/15 2151  . enoxaparin (LOVENOX) injection 40 mg  40 mg Subcutaneous Q0600 Lance Coon, MD   40 mg at 05/12/15 0533  . hydrALAZINE (APRESOLINE) injection 10 mg  10 mg Intravenous Q6H PRN Bettey Costa, MD      . hydrALAZINE (APRESOLINE) tablet 50 mg  50 mg Oral BID Bettey Costa, MD   50 mg at 05/12/15 1048  . HYDROcodone-acetaminophen (NORCO/VICODIN) 5-325 MG per tablet 1 tablet  1 tablet Oral Q6H PRN Bettey Costa, MD   1 tablet at 05/12/15 1729  . morphine 4 MG/ML injection 4 mg  4 mg Intravenous Q4H PRN Bettey Costa, MD   4 mg at 05/11/15 1952  . ondansetron (ZOFRAN) tablet 4 mg  4 mg Oral Q6H PRN Lance Coon, MD       Or  . ondansetron [facility]) injection 4 mg   4 mg Intravenous Q6H PRN Lance Coon, MD   4 mg at 05/11/15 1806  . promethazine (PHENERGAN) tablet 25 mg  25 mg Oral Q6H PRN Bettey Costa, MD       Or  . promethazine (PHENERGAN) injection 25 mg  25 mg Intravenous Q6H PRN Bettey Costa, MD   25 mg at 05/10/15 0935  . senna (SENOKOT) tablet 8.6 mg  1 tablet Oral Daily Lenis Noon, RPH   8.6 mg at 05/12/15 1408     Objective: Vital signs in last 24 hours: Temp:  [98.2 F (36.8 C)-99.8 F (37.7 C)] 98.4 F (36.9 C) (01/24 1547) Pulse Rate:  [56-64] 60 (01/24 1547) Resp:  [17-20] 17 (01/24 1547) BP: (141-159)/(83-92) 141/87 mmHg (01/24 1547) SpO2:  [96 %-98 %] 96 % (01/24 1547)  Intake/Output from previous day: 01/23 0701 - 01/24 0700 In: 240 [P.O.:240] Out: 1320 [Urine:1320] Intake/Output this shift: Total I/O In: 360 [P.O.:360] Out: 0    Physical Exam  NAD. A and O 3. No respiratory distress, no increased work of breathing. No clubbing cyanosis or edema.  PPD site on right inner arm negative.  Abdomen soft, nontender, nondistended. + mild CVA tenderness bilaterally Subtle macular rash on back noted, small approximate 1 cm lesion on left inner knee.   Lab Results:   Recent Labs  05/10/15 0322  WBC 9.6  HGB 13.7  HCT 40.0  PLT 167   BMET  Recent Labs  05/11/15 0345 05/12/15 0619  NA 141 142  K 4.4 3.9  CL 109 108  CO2 27 26  GLUCOSE 103* 97  BUN 23* 16  CREATININE 2.13* 1.43*  CALCIUM 8.4* 8.9    Urine culture 1/21 with 4000 colonies.  Blood cultures negative 2.  Studies/Results: Dg Chest 1 View  05/12/2015  CLINICAL DATA:  Evaluation for heart failure. EXAM: CHEST 1 VIEW COMPARISON:  05/08/2015 FINDINGS: Cardiomediastinal silhouette is normal. Mediastinal contours appear intact. There is no evidence of focal airspace consolidation, pleural effusion or pneumothorax. Osseous structures are without acute abnormality. Soft tissues are grossly normal. IMPRESSION: No evidence of congestive heart failure.  Normal radiograph of the chest. Electronically Signed   By: Fidela Salisbury M.D.   On: 05/12/2015 10:44    Impression/Assessment:   1) Flank pain- presumably related to perinephric/ renal process. Question if this is infectious vs. Inflammatory vs. Malignant given atypical presentation without fevers, cystitis, identifiable organism of culture (blood and urine). DDx includes hematogenous pyelonephritis (no risk factors including no h/o IVDU), septic emboli, TB (PPD negative), lymphoma, inflammatory of unclear etiology, ? Interstitial granulomatous nephritis?   2) Bilateral perinephric stranding- as above  3) Acute renal failure- ? Related to IV contrast with dehydration or intrinsic renal disease. No evidence of obstructive process on CT scan. Cr improving.    4) Microscopic hematuria- on UA, no sign of infection. Will likely need outpatient work up including cystoscopy if microscopic hematuria persistent on UA as outpatient.   Plan :  -f/u HIV, other serologies ordered by nephrology -f/u blood/ urine culture although negative -continue ceftriaxone for presumed pyelonephritis although clinic picture not entirely consistent with this diagnosis -continue to trend Cr/ avoid nephrotoxic agents -RUS planned for tomorrow -will check for eosinophils in urine -patient will need outpatient follow up with office cystoscopy for completion of microscopic hematuria work up/  will likely also repeat CT scan as outpatient    LOS: 3 days    Hollice Espy 05/12/2015

## 2015-05-12 NOTE — Progress Notes (Signed)
[facility] [place] NAME: David [name]    MR#:  [id number]  DATE OF BIRTH:  [birth year]  SUBJECTIVE:   Patient's headache is better still with flank pain  REVIEW OF SYSTEMS:    Review of Systems  Constitutional: Negative for fever, chills and malaise/fatigue.  HENT: Negative for sore throat and tinnitus.   Eyes: Negative for blurred vision.  Respiratory: Negative for cough, hemoptysis, shortness of breath and wheezing.   Cardiovascular: Negative for chest pain, palpitations and leg swelling.  Gastrointestinal: Negative for abdominal pain, diarrhea and blood in stool.  Genitourinary: Positive for flank pain. Negative for dysuria.  Musculoskeletal: Negative for back pain.  Neurological: Negative for dizziness, tremors and headaches.  Endo/Heme/Allergies: Does not bruise/bleed easily.    Tolerating Diet:  Yes    DRUG ALLERGIES:  No Known Allergies  VITALS:  Blood pressure 159/92, pulse 64, temperature 98.2 F (36.8 C), temperature source Oral, resp. rate 17, height 6\' 3"  (1.905 m), weight 107.956 kg (238 lb), SpO2 97 %.  PHYSICAL EXAMINATION:   Physical Exam  Constitutional: He is oriented to person, place, and time and well-developed, well-nourished, and in no distress. No distress.  HENT:  Head: Normocephalic.  Eyes: No scleral icterus.  Neck: Normal range of motion. Neck supple. No JVD present. No tracheal deviation present.  Cardiovascular: Normal rate, regular rhythm and normal heart sounds.  Exam reveals no gallop and no friction rub.   No murmur heard. Pulmonary/Chest: Effort normal and breath sounds normal. No respiratory distress. He has no wheezes. He has no rales. He exhibits no tenderness.  Abdominal: Soft. Bowel sounds are normal. He exhibits no distension and no mass. There is no tenderness. There is no rebound and no guarding.  Musculoskeletal: Normal range of motion. He exhibits no edema.  Bilteral CVA  tenderness  Neurological: He is alert and oriented to person, place, and time.  Skin: Skin is warm. No rash noted. No erythema.  Psychiatric: Affect and judgment normal.      LABORATORY PANEL:   CBC  Recent Labs Lab 05/10/15 0322  WBC 9.6  HGB 13.7  HCT 40.0  PLT 167   ------------------------------------------------------------------------------------------------------------------  Chemistries   Recent Labs Lab 05/09/15 1417  05/12/15 0619  NA 143  < > 142  K 4.1  < > 3.9  CL 108  < > 108  CO2 26  < > 26  GLUCOSE 101*  < > 97  BUN 21*  < > 16  CREATININE 1.47*  < > 1.43*  CALCIUM 9.5  < > 8.9  AST 62*  --   --   ALT 102*  --   --   ALKPHOS 78  --   --   BILITOT 0.3  --   --   < > = values in this interval not displayed. ------------------------------------------------------------------------------------------------------------------  Cardiac Enzymes  Recent Labs Lab 05/08/15 1535 05/09/15 1417  TROPONINI <0.03 <0.03   ------------------------------------------------------------------------------------------------------------------  RADIOLOGY:  No results found.   ASSESSMENT AND PLAN:     45 year old male with history of essential hypertensin with presented with abdominal pain  and found to have severe bilateral symmetric perinephric inflammatory changes on CT scan.   1.  Bilateral perinephric inflammatory changes with the possibility of Acute pyelonephritis:  the etiology of bilateral perinephric inflammatory changes is unclear at this time. It is possible that the patient has acute pyelonephritis. Patient is on Rocephin. Urine culture shows 4000 colonies which is insignificant  growth. As per urology recommendations plan is for patient to obtain CT scan or renal ultrasound today. He will need outpatient cystoscopy.  2. Nausea with vomiting old and this has resolved and was thought to be due to Dilaudid .   3. ARF: Renal failure is due to prerenal  azotemia in combination with ATN from IV contrast. Creatinine has improved Appreciate nephrology consultation. Patient is being worked up for [employer].  4. Accelerated hypertension: Blood pressure is still elevated and therefore I will increase hydralazine 50 mg PO BID. Continue Norvasc and continue to monitor blood pressure.      Management plans discussed with the patient and he is in agreement.  CODE STATUS: FULL  TOTAL TIME TAKING CARE OF THIS PATIENT: 25 minutes.     POSSIBLE D/C 2-3 days, DEPENDING ON CLINICAL CONDITION.   Jenya Putz M.D on 05/12/2015 at 10:21 AM  Between 7am to 6pm - Pager - [phone] After 6pm go to [email or web address] - password [email or web address] [facility]  Office  [phone]  CC: Primary care physician; Enid Derry, MD  Note: This dictation was prepared with Dragon dictation along with smaller phrase technology. Any transcriptional errors that result from this process are unintentional.

## 2015-05-13 ENCOUNTER — Telehealth: Payer: Self-pay

## 2015-05-13 ENCOUNTER — Inpatient Hospital Stay: Payer: BLUE CROSS/BLUE SHIELD

## 2015-05-13 DIAGNOSIS — R109 Unspecified abdominal pain: Secondary | ICD-10-CM | POA: Insufficient documentation

## 2015-05-13 LAB — CBC WITH DIFFERENTIAL/PLATELET
BASOS ABS: 0 10*3/uL (ref 0–0.1)
BASOS PCT: 1 %
EOS ABS: 0.3 10*3/uL (ref 0–0.7)
EOS PCT: 5 %
HCT: 43.6 % (ref 40.0–52.0)
HEMOGLOBIN: 14.5 g/dL (ref 13.0–18.0)
Lymphocytes Relative: 29 %
Lymphs Abs: 1.8 10*3/uL (ref 1.0–3.6)
MCH: 28.1 pg (ref 26.0–34.0)
MCHC: 33.3 g/dL (ref 32.0–36.0)
MCV: 84.3 fL (ref 80.0–100.0)
Monocytes Absolute: 0.7 10*3/uL (ref 0.2–1.0)
Monocytes Relative: 12 %
NEUTROS PCT: 55 %
Neutro Abs: 3.4 10*3/uL (ref 1.4–6.5)
PLATELETS: 208 10*3/uL (ref 150–440)
RBC: 5.17 MIL/uL (ref 4.40–5.90)
RDW: 13.6 % (ref 11.5–14.5)
WBC: 6.2 10*3/uL (ref 3.8–10.6)

## 2015-05-13 LAB — GLUCOSE, CAPILLARY
GLUCOSE-CAPILLARY: 76 mg/dL (ref 65–99)
GLUCOSE-CAPILLARY: 95 mg/dL (ref 65–99)
GLUCOSE-CAPILLARY: 93 mg/dL (ref 65–99)
Glucose-Capillary: 85 mg/dL (ref 65–99)

## 2015-05-13 LAB — BASIC METABOLIC PANEL
ANION GAP: 8 (ref 5–15)
BUN: 17 mg/dL (ref 6–20)
CO2: 27 mmol/L (ref 22–32)
Calcium: 9.2 mg/dL (ref 8.9–10.3)
Chloride: 101 mmol/L (ref 101–111)
Creatinine, Ser: 1.43 mg/dL — ABNORMAL HIGH (ref 0.61–1.24)
GFR, EST NON AFRICAN AMERICAN: 57 mL/min — AB (ref >60)
Glucose, Bld: 93 mg/dL (ref 65–99)
POTASSIUM: 3.9 mmol/L (ref 3.5–5.1)
SODIUM: 136 mmol/L (ref 135–145)

## 2015-05-13 MED ORDER — ENOXAPARIN SODIUM 40 MG/0.4ML ~~LOC~~ SOLN
40.0000 mg | SUBCUTANEOUS | Status: DC
  Administered 2015-05-13: 40 mg via SUBCUTANEOUS
  Filled 2015-05-13: qty 0.4

## 2015-05-13 MED ORDER — DOCUSATE SODIUM 100 MG PO CAPS
100.0000 mg | ORAL_CAPSULE | Freq: Two times a day (BID) | ORAL | Status: DC
  Administered 2015-05-13 – 2015-05-14 (×3): 100 mg via ORAL
  Filled 2015-05-13 (×3): qty 1

## 2015-05-13 MED ORDER — POLYETHYLENE GLYCOL 3350 17 G PO PACK
17.0000 g | PACK | Freq: Once | ORAL | Status: AC
  Administered 2015-05-13: 17 g via ORAL
  Filled 2015-05-13: qty 1

## 2015-05-13 MED ORDER — DIPHENHYDRAMINE HCL 50 MG/ML IJ SOLN
INTRAMUSCULAR | Status: AC
  Filled 2015-05-13: qty 1

## 2015-05-13 MED ORDER — DIPHENHYDRAMINE HCL 50 MG/ML IJ SOLN
25.0000 mg | Freq: Once | INTRAMUSCULAR | Status: AC
Start: 2015-05-13 — End: 2015-05-13
  Administered 2015-05-13: 25 mg via INTRAVENOUS

## 2015-05-13 MED ORDER — HYDRALAZINE HCL 50 MG PO TABS
100.0000 mg | ORAL_TABLET | Freq: Two times a day (BID) | ORAL | Status: DC
  Administered 2015-05-13 – 2015-05-14 (×2): 100 mg via ORAL
  Filled 2015-05-13 (×2): qty 2

## 2015-05-13 MED ORDER — HYDROCODONE-ACETAMINOPHEN 5-325 MG PO TABS: 1.0000 | ORAL_TABLET | ORAL | Status: DC | PRN

## 2015-05-13 NOTE — Progress Notes (Signed)
Urology Consult Follow Up  Subjective: Allergic reaction with hives behind ear after taking miralax and colace.  Flank pain improving.  Cr stabilized to 1.43.    RUS stable, fairly unremarkable.    Anti-infectives: Anti-infectives    Start     Dose/Rate Route Frequency Ordered Stop   05/10/15 2300  cefTRIAXone (ROCEPHIN) 1 g in dextrose 5 % 50 mL IVPB     1 g 100 mL/hr over 30 Minutes Intravenous Every 24 hours 05/10/15 0100     05/09/15 2315  cefTRIAXone (ROCEPHIN) 1 g in dextrose 5 % 50 mL IVPB     1 g 100 mL/hr over 30 Minutes Intravenous  Once 05/09/15 2309 05/09/15 2352      Current Facility-Administered Medications  Medication Dose Route Frequency Provider Last Rate Last Dose  . acetaminophen (TYLENOL) tablet 650 mg  650 mg Oral Q6H PRN Lance Coon, MD   650 mg at 05/12/15 2110   Or  . acetaminophen (TYLENOL) suppository 650 mg  650 mg Rectal Q6H PRN Lance Coon, MD      . amLODipine (NORVASC) tablet 10 mg  10 mg Oral Daily Lance Coon, MD   10 mg at 05/13/15 0819  . cefTRIAXone (ROCEPHIN) 1 g in dextrose 5 % 50 mL IVPB  1 g Intravenous Q24H Lance Coon, MD   1 g at 05/12/15 2234  . diphenhydrAMINE (BENADRYL) 50 MG/ML injection           . docusate sodium (COLACE) capsule 100 mg  100 mg Oral BID Lenis Noon, RPH   100 mg at 05/13/15 1457  . enoxaparin (LOVENOX) injection 40 mg  40 mg Subcutaneous Q24H Lenis Noon, RPH      . hydrALAZINE (APRESOLINE) injection 10 mg  10 mg Intravenous Q6H PRN Bettey Costa, MD   10 mg at 05/13/15 1558  . hydrALAZINE (APRESOLINE) tablet 100 mg  100 mg Oral BID Bettey Costa, MD      . HYDROcodone-acetaminophen (NORCO/VICODIN) 5-325 MG per tablet 1 tablet  1 tablet Oral Q4H PRN Bettey Costa, MD      . morphine 4 MG/ML injection 4 mg  4 mg Intravenous Q4H PRN Bettey Costa, MD   4 mg at 05/13/15 1308  . ondansetron (ZOFRAN) tablet 4 mg  4 mg Oral Q6H PRN Lance Coon, MD       Or  . ondansetron [facility]) injection 4 mg  4 mg Intravenous Q6H PRN Lance Coon, MD   4 mg at 05/11/15 1806  . promethazine (PHENERGAN) tablet 25 mg  25 mg Oral Q6H PRN Bettey Costa, MD       Or  . promethazine (PHENERGAN) injection 25 mg  25 mg Intravenous Q6H PRN Bettey Costa, MD   25 mg at 05/10/15 0935  . senna (SENOKOT) tablet 8.6 mg  1 tablet Oral Daily Lenis Noon, RPH   8.6 mg at 05/13/15 [id number]     Objective: Vital signs in last 24 hours: Temp:  [97.9 F (36.6 C)-98.5 F (36.9 C)] 98.3 F (36.8 C) (01/25 1548) Pulse Rate:  [49-71] 71 (01/25 1548) Resp:  [16-18] 18 (01/25 1548) BP: (137-171)/(74-100) 162/100 mmHg (01/25 1548) SpO2:  [97 %-98 %] 98 % (01/25 1548)  Intake/Output from previous day: 01/24 0701 - 01/25 0700 In: 770 [P.O.:720; IV Piggyback:50] Out: 350 [Urine:350] Intake/Output this shift: Total I/O In: 240 [P.O.:240] Out: 500 [Urine:500]   Physical Exam  NAD. A and O 3. No respiratory distress, no increased work of breathing.  No clubbing cyanosis or edema.  Abdomen soft, nontender, nondistended. Hives behind ears.     Lab Results:   Recent Labs  05/13/15 0621  WBC 6.2  HGB 14.5  HCT 43.6  PLT 208   BMET  Recent Labs  05/12/15 0619 05/13/15 0621  NA 142 136  K 3.9 3.9  CL 108 101  CO2 26 27  GLUCOSE 97 93  BUN 16 17  CREATININE 1.43* 1.43*  CALCIUM 8.9 9.2    Urine culture 1/21 with 4000 colonies.  Blood cultures negative 2.  Studies/Results: Dg Chest 1 View  05/12/2015  CLINICAL DATA:  Evaluation for heart failure. EXAM: CHEST 1 VIEW COMPARISON:  05/08/2015 FINDINGS: Cardiomediastinal silhouette is normal. Mediastinal contours appear intact. There is no evidence of focal airspace consolidation, pleural effusion or pneumothorax. Osseous structures are without acute abnormality. Soft tissues are grossly normal. IMPRESSION: No evidence of congestive heart failure. Normal radiograph of the chest. Electronically Signed   By: Fidela Salisbury M.D.   On: 05/12/2015 10:44   US Renal  05/13/2015  CLINICAL  DATA:  Bilateral flank pain right greater than left for 1 week EXAM: RENAL / URINARY TRACT ULTRASOUND COMPLETE COMPARISON:  CT scan 05/09/2015 FINDINGS: Right Kidney: Length: 13.4 cm. There is normal echogenicity. There is a cyst in upper pole measures 1.4 x 2 cm. A smaller cyst is noted in lower pole. No hydronephrosis. No renal calculi. Left Kidney: Length: 13.8 cm in length. There is normal echogenicity. A small cyst is noted in midpole measures 1 x 1.2 cm. There is faint some medullary echogenic calcification in midpole without definite evidence of a mass. Bladder: Appears normal for degree of bladder distention. IMPRESSION: 1. No hydronephrosis. At least 2 cysts are noted within right kidney the largest in upper pole measures 2 cm. No right renal calculus. 2. No left hydronephrosis. Small cyst in midpole measures 1.2 cm. Faint small area of medullary echogenic calcification in midpole without evidence of mass. 3. Unremarkable urinary bladder. Electronically Signed   By: Lahoma Crocker M.D.   On: 05/13/2015 14:33    Impression/Assessment:   1) Flank pain- presumably related to perinephric/ renal process. Question if this is infectious vs. Inflammatory vs. Malignant given atypical presentation without fevers, cystitis, identifiable organism of culture (blood and urine). DDx includes hematogenous pyelonephritis (no risk factors including no h/o IVDU), septic emboli, TB (PPD negative), lymphoma, inflammatory of unclear etiology, interstitial granulomatous nephritis.  Work up thus far negative.    2) Bilateral perinephric stranding- as above  3) Acute renal failure- Likely related to IV contrast with dehydration or intrinsic renal disease. No evidence of obstructive process on CT scan. Cr stabilized.    4) Microscopic hematuria- on UA, no sign of infection. Will likely need outpatient work up including cystoscopy if microscopic hematuria persistent on UA as outpatient.   Plan :  -f/u HIV, other  serologies ordered by nephrology -f/u urinary esoinophils -f/u blood/ urine culture although negative -continue to trend Cr/ avoid nephrotoxic agents -patient will need outpatient follow up with office cystoscopy for completion of microscopic hematuria work up/  will likely also repeat CT scan as outpatient  OK to discharge from Urology perspective.  No need for additional abx upon discharge given negative cultures and several days of IV abx.    Will arrange for f/u in 2-3 weeks.  Urology to sign off for now, please page or call if you have any questions or concerns.     LOS: 4 days  Hollice Espy 05/13/2015

## 2015-05-13 NOTE — Progress Notes (Signed)
Pharmacy Antibiotic Follow-up Note  David [name] is a 45 y.o. year-old male admitted on 05/09/2015.  The patient is currently on day 5 of ceftriaxone 1 g IV daily for empiric UTI treatment.  Assessment/Plan: This patient's current antibiotics will be continued without adjustments.  Patient is scheduled for renal ultrasound today. Urine culture has insignificant growth of 4K colonies, patient is afebrile with WBC of 6.2. Urology and nephrology following patient. Plan is to continue ceftriaxone while further tests being conducted.   Temp (24hrs), Avg:98.2 F (36.8 C), Min:97.9 F (36.6 C), Max:98.5 F (36.9 C)   Recent Labs Lab 05/08/15 1535 05/09/15 1417 05/10/15 0322 05/13/15 0621  WBC 7.2 13.4* 9.6 6.2    Recent Labs Lab 05/09/15 1417 05/10/15 0322 05/11/15 0345 05/12/15 0619 05/13/15 0621  CREATININE 1.47* 2.30* 2.13* 1.43* 1.43*   Estimated Creatinine Clearance: 85.7 mL/min (by C-G formula based on Cr of 1.43).    No Known Allergies  Antimicrobials this admission: ceftriaxone 1/21 >> 1/25  Microbiology results: 1/21 BCx: no growth 4 days 1/21 UCx: 4000 colonies, insignificant growth   Thank you for allowing pharmacy to be a part of this patient's care.  Lenis Noon PharmD Clinical Pharmacist 05/13/2015 12:37 PM

## 2015-05-13 NOTE — Telephone Encounter (Signed)
I talked with his wife; they sent him home on Friday and then Saturday, he was in so much pain, and they brought him back; he might get to go home tomorrow; has seen kidney doctor and urologist

## 2015-05-13 NOTE — Progress Notes (Signed)
[facility] [facility] [place] NAME: David [name]    MR#:  [id number]  DATE OF BIRTH:  December 02, 1970  SUBJECTIVE:    Overall patient states he has decreased pain. He denies dysuria or frequency. REVIEW OF SYSTEMS:    Review of Systems  Constitutional: Negative for fever, chills and malaise/fatigue.  HENT: Negative for sore throat and tinnitus.   Eyes: Negative for blurred vision.  Respiratory: Negative for cough, hemoptysis, shortness of breath and wheezing.   Cardiovascular: Negative for chest pain, palpitations and leg swelling.  Gastrointestinal: Negative for abdominal pain, diarrhea and blood in stool.  Genitourinary: Positive for flank pain. Negative for dysuria.  Musculoskeletal: Negative for back pain.  Neurological: Negative for dizziness, tremors and headaches.  Endo/Heme/Allergies: Does not bruise/bleed easily.    Tolerating Diet:  Yes    DRUG ALLERGIES:  No Known Allergies  VITALS:  Blood pressure 163/94, pulse 60, temperature 97.9 F (36.6 C), temperature source Oral, resp. rate 18, height 6\' 3"  (1.905 m), weight 107.956 kg (238 lb), SpO2 97 %.  PHYSICAL EXAMINATION:   Physical Exam  Constitutional: He is oriented to person, place, and time and well-developed, well-nourished, and in no distress. No distress.  HENT:  Head: Normocephalic.  Eyes: No scleral icterus.  Neck: Normal range of motion. Neck supple. No JVD present. No tracheal deviation present.  Cardiovascular: Normal rate, regular rhythm and normal heart sounds.  Exam reveals no gallop and no friction rub.   No murmur heard. Pulmonary/Chest: Effort normal and breath sounds normal. No respiratory distress. He has no wheezes. He has no rales. He exhibits no tenderness.  Abdominal: Soft. Bowel sounds are normal. He exhibits no distension and no mass. There is no tenderness. There is no rebound and no guarding.  Musculoskeletal: Normal range of motion. He exhibits  no edema.  Bilateral CVAT  Neurological: He is alert and oriented to person, place, and time.  Skin: Skin is warm. No rash noted. No erythema.  Psychiatric: Affect and judgment normal.      LABORATORY PANEL:   CBC  Recent Labs Lab 05/13/15 0621  WBC 6.2  HGB 14.5  HCT 43.6  PLT 208   ------------------------------------------------------------------------------------------------------------------  Chemistries   Recent Labs Lab 05/09/15 1417  05/13/15 0621  NA 143  < > 136  K 4.1  < > 3.9  CL 108  < > 101  CO2 26  < > 27  GLUCOSE 101*  < > 93  BUN 21*  < > 17  CREATININE 1.47*  < > 1.43*  CALCIUM 9.5  < > 9.2  AST 62*  --   --   ALT 102*  --   --   ALKPHOS 78  --   --   BILITOT 0.3  --   --   < > = values in this interval not displayed. ------------------------------------------------------------------------------------------------------------------  Cardiac Enzymes  Recent Labs Lab 05/08/15 1535 05/09/15 1417  TROPONINI <0.03 <0.03   ------------------------------------------------------------------------------------------------------------------  RADIOLOGY:  Dg Chest 1 View  05/12/2015  CLINICAL DATA:  Evaluation for heart failure. EXAM: CHEST 1 VIEW COMPARISON:  05/08/2015 FINDINGS: Cardiomediastinal silhouette is normal. Mediastinal contours appear intact. There is no evidence of focal airspace consolidation, pleural effusion or pneumothorax. Osseous structures are without acute abnormality. Soft tissues are grossly normal. IMPRESSION: No evidence of congestive heart failure. Normal radiograph of the chest. Electronically Signed   By: Fidela Salisbury M.D.   On: 05/12/2015 10:44  ASSESSMENT AND PLAN:     45 year old male with history of essential hypertensin with presented with abdominal pain  and found to have severe bilateral symmetric perinephric inflammatory changes on CT scan.   1.  Bilateral perinephric inflammatory changes with the  possibility of Acute pyelonephritis with multiple renal cysts:  the etiology of bilateral perinephric inflammatory changes is unclear at this time. It is possible that the patient has acute pyelonephritis. Patient is on Rocephin. Urine culture shows 4000 colonies which is insignificant growth. Follow up on renal ultrasound. Patient will need outpatient follow-up with cystoscopy for completion of microscopic hematuria workup and a repeat CT scan as an outpatient as per urology consultation.  2. Nausea with vomiting old and this has resolved and was thought to be due to Dilaudid .   3. ARF: Renal failure is due to prerenal azotemia in combination with ATN from IV contrast. Creatinine has improved. Appreciate nephrology consultation.  4. Accelerated hypertension: Blood pressure is still elevated and therefore I will increase hydralazine 100 mg PO BID. Continue Norvasc and continue to monitor blood pressure.      Management plans discussed with the patient and he is in agreement.  CODE STATUS: FULL  TOTAL TIME TAKING CARE OF THIS PATIENT: 25 minutes.     POSSIBLE D/C tomorrow, DEPENDING ON CLINICAL CONDITION.   Suriah Peragine M.D on 05/13/2015 at 10:44 AM  Between 7am to 6pm - Pager - [phone] After 6pm go to [email or web address] - password [email or web address] [facility]  Office  [phone]  CC: Primary care physician; Enid Derry, MD  Note: This dictation was prepared with Dragon dictation along with smaller phrase technology. Any transcriptional errors that result from this process are unintentional.

## 2015-05-13 NOTE — Progress Notes (Signed)
[facility]  ROUNDING NOTE   Subjective:   Wife at bedside.  Creatinine 1.43 (1.43) UOP 1320  Objective:  Vital signs in last 24 hours:  Temp:  [97.9 F (36.6 C)-98.5 F (36.9 C)] 97.9 F (36.6 C) (01/25 0734) Pulse Rate:  [49-60] 60 (01/25 0734) Resp:  [16-18] 18 (01/25 0734) BP: (137-171)/(74-94) 163/94 mmHg (01/25 0734) SpO2:  [96 %-97 %] 97 % (01/25 0734)  Weight change:  Filed Weights   05/09/15 1410  Weight: 107.956 kg (238 lb)    Intake/Output: I/O last 3 completed shifts: In: 45 [P.O.:720; IV Piggyback:50] Out: [id number] [Urine:1670]   Intake/Output this shift:  Total I/O In: 240 [P.O.:240] Out: 500 [Urine:500]  Physical Exam: General: NAD, sitting up in bed.   Head: Normocephalic, atraumatic. Moist oral mucosal membranes  Eyes: Anicteric, PERRL  Neck: Supple, trachea midline  Lungs:  Clear to auscultation  Heart: Regular rate and rhythm  Abdomen:  Soft, nontender,   Extremities: No peripheral edema.  Neurologic: Nonfocal, moving all four extremities  Skin: No lesions       Basic Metabolic Panel:  Recent Labs Lab 05/09/15 1417 05/10/15 0322 05/11/15 0345 05/12/15 0619 05/13/15 0621  NA 143 139 141 142 136  K 4.1 4.4 4.4 3.9 3.9  CL 108 107 109 108 101  CO2 26 25 27 26 27   GLUCOSE 101* 110* 103* 97 93  BUN 21* 27* 23* 16 17  CREATININE 1.47* 2.30* 2.13* 1.43* 1.43*  CALCIUM 9.5 8.8* 8.4* 8.9 9.2    Liver Function Tests:  Recent Labs Lab 05/08/15 1535 05/09/15 1417  AST 71* 62*  ALT 101* 102*  ALKPHOS 82 78  BILITOT 0.5 0.3  PROT 7.4 7.8  ALBUMIN 4.1 4.6    Recent Labs Lab 05/08/15 1535 05/09/15 1417  LIPASE 24 24   No results for input(s): AMMONIA in the last 168 hours.  CBC:  Recent Labs Lab 05/08/15 1535 05/09/15 1417 05/10/15 0322 05/13/15 0621  WBC 7.2 13.4* 9.6 6.2  NEUTROABS  --   --   --  3.4  HGB 14.1 14.3 13.7 14.5  HCT 41.8 43.3 40.0 43.6  MCV 83.8 82.8 83.8 84.3  PLT 179 200 167 208     Cardiac Enzymes:  Recent Labs Lab 05/08/15 1535 05/09/15 1417  TROPONINI <0.03 <0.03    BNP: Invalid input(s): POCBNP  CBG:  Recent Labs Lab 05/12/15 1057 05/12/15 1545 05/12/15 2049 05/13/15 0733 05/13/15 1136  GLUCAP 98 80 86 76 85    Microbiology: Results for orders placed or performed during the hospital encounter of 05/09/15  Urine culture     Status: None   Collection Time: 05/09/15 10:09 PM  Result Value Ref Range Status   Specimen Description URINE, RANDOM  Final   Special Requests Normal  Final   Culture 4,000 COLONIES/mL INSIGNIFICANT GROWTH  Final   Report Status 05/11/2015 FINAL  Final  Culture, blood (routine x 2)     Status: None (Preliminary result)   Collection Time: 05/09/15 11:24 PM  Result Value Ref Range Status   Specimen Description BLOOD RIGHT ASSIST CONTROL  Final   Special Requests BOTTLES DRAWN AEROBIC AND ANAEROBIC 7CCAERO,2CCANA  Final   Culture NO GROWTH 4 DAYS  Final   Report Status PENDING  Incomplete  Culture, blood (routine x 2)     Status: None (Preliminary result)   Collection Time: 05/09/15 11:24 PM  Result Value Ref Range Status   Specimen Description BLOOD LEFT ASSIST CONTROL  Final  Special Requests BOTTLES DRAWN AEROBIC AND ANAEROBIC 5CCAERO,4CCANA  Final   Culture NO GROWTH 4 DAYS  Final   Report Status PENDING  Incomplete    Coagulation Studies: No results for input(s): LABPROT, INR in the last 72 hours.  Urinalysis: No results for input(s): COLORURINE, LABSPEC, PHURINE, GLUCOSEU, HGBUR, BILIRUBINUR, KETONESUR, PROTEINUR, UROBILINOGEN, NITRITE, LEUKOCYTESUR in the last 72 hours.  Invalid input(s): APPERANCEUR    Imaging: Dg Chest 1 View  05/12/2015  CLINICAL DATA:  Evaluation for heart failure. EXAM: CHEST 1 VIEW COMPARISON:  05/08/2015 FINDINGS: Cardiomediastinal silhouette is normal. Mediastinal contours appear intact. There is no evidence of focal airspace consolidation, pleural effusion or pneumothorax.  Osseous structures are without acute abnormality. Soft tissues are grossly normal. IMPRESSION: No evidence of congestive heart failure. Normal radiograph of the chest. Electronically Signed   By: Fidela Salisbury M.D.   On: 05/12/2015 10:44   US Renal  05/13/2015  CLINICAL DATA:  Bilateral flank pain right greater than left for 1 week EXAM: RENAL / URINARY TRACT ULTRASOUND COMPLETE COMPARISON:  CT scan 05/09/2015 FINDINGS: Right Kidney: Length: 13.4 cm. There is normal echogenicity. There is a cyst in upper pole measures 1.4 x 2 cm. A smaller cyst is noted in lower pole. No hydronephrosis. No renal calculi. Left Kidney: Length: 13.8 cm in length. There is normal echogenicity. A small cyst is noted in midpole measures 1 x 1.2 cm. There is faint some medullary echogenic calcification in midpole without definite evidence of a mass. Bladder: Appears normal for degree of bladder distention. IMPRESSION: 1. No hydronephrosis. At least 2 cysts are noted within right kidney the largest in upper pole measures 2 cm. No right renal calculus. 2. No left hydronephrosis. Small cyst in midpole measures 1.2 cm. Faint small area of medullary echogenic calcification in midpole without evidence of mass. 3. Unremarkable urinary bladder. Electronically Signed   By: Lahoma Crocker M.D.   On: 05/13/2015 14:33     Medications:     . amLODipine  10 mg Oral Daily  . cefTRIAXone (ROCEPHIN)  IV  1 g Intravenous Q24H  . docusate sodium  100 mg Oral BID  . enoxaparin (LOVENOX) injection  40 mg Subcutaneous Q24H  . hydrALAZINE  100 mg Oral BID  . senna  1 tablet Oral Daily   acetaminophen **OR** acetaminophen, hydrALAZINE, HYDROcodone-acetaminophen, morphine injection, ondansetron **OR** ondansetron (ZOFRAN) IV, promethazine **OR** promethazine  Assessment/ Plan:  David [name] is a 45 y.o. white  male with gout, hyperlipidemia, hypertension, history of left gynecomastia, who was admitted to [facility] on 05/09/2015   1. Acute  Renal Failure with hematuria: baseline creatinine of 0.9. Contrast exposure on 1/21 however creatinine was already trending upward. Suspect ATN from contrast induced nephropathy and ATN from sepsis/pyelonephritis.  Granulamotous nephritis? Could be due to IV contrast. This is a very rare diagnosis.  - Creatinine improving along with urine output. Continue IV fluids. Encourage PO intake. If no improvement, may need a steroid taper.  - Renally dose medication - Pending serologic work up for glomerulonephritis.  - Monitor daily for dialysis need.   2. Urinary tract infection/pyelonephritis: cultures without significant growth. Wbc trended down.  - ceftriaxone.   3. Hypertension: blood pressure currently at goal. Home regimen of amlodipine and losartan. Holding losartan due to acute renal failure.  4. Gout: remote history.  - check uric acid when creatinine stable.    LOS: 4 Mlissa Tamayo 1/25/20173:20 PM

## 2015-05-13 NOTE — Telephone Encounter (Signed)
His wife wanted to let you know that he is still in the hospital. They think it is a kidney issues, the kidney specialist said something about "nephritis" something. They think he just had an allergic reaction to something and it attacked his kidneys. She said his kidney function is improving.

## 2015-05-14 LAB — CULTURE, BLOOD (ROUTINE X 2)
CULTURE: NO GROWTH
Culture: NO GROWTH

## 2015-05-14 LAB — GLUCOSE, CAPILLARY
GLUCOSE-CAPILLARY: 106 mg/dL — AB (ref 65–99)
Glucose-Capillary: 72 mg/dL (ref 65–99)

## 2015-05-14 LAB — EOSINOPHIL, URINE: Eosinophil, Urine: NONE SEEN %

## 2015-05-14 MED ORDER — HYDROCODONE-ACETAMINOPHEN 5-325 MG PO TABS: 1.0000 | ORAL_TABLET | ORAL | Status: DC | PRN

## 2015-05-14 NOTE — Progress Notes (Signed)
DISCHARGE NOTE:  Pt given discharge instructions and prescriptions. Pt verbalized understanding. Pt wheeled to car.  

## 2015-05-14 NOTE — Discharge Summary (Signed)
[facility] NAME: David [name]    MR#:  [id number]  DATE OF BIRTH:  1970/07/07  DATE OF ADMISSION:  05/09/2015 ADMITTING PHYSICIAN: Lance Coon, MD  DATE OF DISCHARGE: 05/14/15  PRIMARY CARE PHYSICIAN: Enid Derry, MD    ADMISSION DIAGNOSIS:  Pyelonephritis [N12]  DISCHARGE DIAGNOSIS:  Bilateral perinephric stranding likely inflammatory with multiple renal cyst. Hypertension Acute renal failure improved  SECONDARY DIAGNOSIS:   Past Medical History  Diagnosis Date  . Gout   . Gynecomastia, male left  . Eczema   . Hyperlipidemia   . Hypertension   . Snoring     HOSPITAL COURSE:   45 year old male with history of essential hypertensin with presented with abdominal pain and found to have severe bilateral symmetric perinephric inflammatory changes on CT scan.   1. Bilateral perinephric inflammatory changes with the possibility of Acute pyelonephritis with multiple renal cysts: the etiology of bilateral perinephric inflammatory changes is unclear at this time.  -Urine culture negative only 4000 colonies of insignificant growth  -Patient was on Rocephin. No indication for antibiotics per Dr. Rayburn Ma  -Patient advised to follow-up with Dr. Erlene Quan as outpatient for cystoscopy exam  - renal ultrasound showed no hydronephrosis. At least 2 cysts noted within the right kidney. No renal calculus. Small cyst noted in the midpole of the left kidney. Unremarkable bladder. - Patient will need outpatient follow-up with cystoscopy for completion of microscopic hematuria workup and a repeat CT scan as an outpatient as per urology consultation.  2. Nausea with vomiting old and this has resolved and was thought to be due to Dilaudid . -Resolved  3. ARF: Renal failure is due to prerenal azotemia in combination with ATN from IV contrast. Creatinine has improved. Appreciate nephrology consultation. -Improved  4. Accelerated  hypertension:  Continue Norvasc and continue to monitor blood pressure.  Overall stable. Patient will be discharged to home. Discharge instructions given to patient and wife. CONSULTS OBTAINED:  Treatment Team:  Anthonette Legato, MD Hollice Espy, MD  DRUG ALLERGIES:  No Known Allergies  DISCHARGE MEDICATIONS:   Current Discharge Medication List    START taking these medications   Details  HYDROcodone-acetaminophen (NORCO/VICODIN) 5-325 MG tablet Take 1 tablet by mouth every 4 (four) hours as needed for moderate pain. Qty: 30 tablet, Refills: 0      CONTINUE these medications which have NOT CHANGED   Details  amLODipine (NORVASC) 10 MG tablet Take 1 tablet (10 mg total) by mouth daily. Qty: 30 tablet, Refills: 11    colchicine 0.6 MG tablet Take 0.6 mg by mouth daily. 2 pills by mouth now and then one pill one hour later (for gout flares).    losartan (COZAAR) 50 MG tablet Take 1 tablet (50 mg total) by mouth daily. Qty: 30 tablet, Refills: 1    traMADol (ULTRAM) 50 MG tablet Take 1 tablet (50 mg total) by mouth every 6 (six) hours as needed. Qty: 15 tablet, Refills: 0        If you experience worsening of your admission symptoms, develop shortness of breath, life threatening emergency, suicidal or homicidal thoughts you must seek medical attention immediately by calling 911 or calling your MD immediately  if symptoms less severe.  You Must read complete instructions/literature along with all the possible adverse reactions/side effects for all the Medicines you take and that have been prescribed to you. Take any new Medicines after you have completely understood and accept all the possible  adverse reactions/side effects.   Please note  You were cared for by a hospitalist during your hospital stay. If you have any questions about your discharge medications or the care you received while you were in the hospital after you are discharged, you can call the unit and asked to  speak with the hospitalist on call if the hospitalist that took care of you is not available. Once you are discharged, your primary care physician will handle any further medical issues. Please note that NO REFILLS for any discharge medications will be authorized once you are discharged, as it is imperative that you return to your primary care physician (or establish a relationship with a primary care physician if you do not have one) for your aftercare needs so that they can reassess your need for medications and monitor your lab values. Today   SUBJECTIVE   Doing well. Patient reports I am ready to get out of here.  VITAL SIGNS:  Blood pressure 150/86, pulse 62, temperature 98.2 F (36.8 C), temperature source Oral, resp. rate 17, height 6\' 3"  (1.905 m), weight 107.956 kg (238 lb), SpO2 99 %.  I/O:   Intake/Output Summary (Last 24 hours) at 05/14/15 1304 Last data filed at 05/14/15 0837  Gross per 24 hour  Intake    770 ml  Output      0 ml  Net    770 ml    PHYSICAL EXAMINATION:  GENERAL:  45 y.o.-year-old patient lying in the bed with no acute distress.  EYES: Pupils equal, round, reactive to light and accommodation. No scleral icterus. Extraocular muscles intact.  HEENT: Head atraumatic, normocephalic. Oropharynx and nasopharynx clear.  NECK:  Supple, no jugular venous distention. No thyroid enlargement, no tenderness.  LUNGS: Normal breath sounds bilaterally, no wheezing, rales,rhonchi or crepitation. No use of accessory muscles of respiration.  CARDIOVASCULAR: S1, S2 normal. No murmurs, rubs, or gallops.  ABDOMEN: Soft, non-tender, non-distended. Bowel sounds present. No organomegaly or mass.  EXTREMITIES: No pedal edema, cyanosis, or clubbing.  NEUROLOGIC: Cranial nerves II through XII are intact. Muscle strength 5/5 in all extremities. Sensation intact. Gait not checked.  PSYCHIATRIC: The patient is alert and oriented x 3.  SKIN: No obvious rash, lesion, or ulcer.   DATA  REVIEW:   CBC   Recent Labs Lab 05/13/15 0621  WBC 6.2  HGB 14.5  HCT 43.6  PLT 208    Chemistries   Recent Labs Lab 05/09/15 1417  05/13/15 0621  NA 143  < > 136  K 4.1  < > 3.9  CL 108  < > 101  CO2 26  < > 27  GLUCOSE 101*  < > 93  BUN 21*  < > 17  CREATININE 1.47*  < > 1.43*  CALCIUM 9.5  < > 9.2  AST 62*  --   --   ALT 102*  --   --   ALKPHOS 78  --   --   BILITOT 0.3  --   --   < > = values in this interval not displayed.  Microbiology Results   Recent Results (from the past 240 hour(s))  Urine culture     Status: None   Collection Time: 05/09/15 10:09 PM  Result Value Ref Range Status   Specimen Description URINE, RANDOM  Final   Special Requests Normal  Final   Culture 4,000 COLONIES/mL INSIGNIFICANT GROWTH  Final   Report Status 05/11/2015 FINAL  Final  Culture, blood (routine x 2)  Status: None   Collection Time: 05/09/15 11:24 PM  Result Value Ref Range Status   Specimen Description BLOOD RIGHT ASSIST CONTROL  Final   Special Requests BOTTLES DRAWN AEROBIC AND ANAEROBIC 7CCAERO,2CCANA  Final   Culture NO GROWTH 5 DAYS  Final   Report Status 05/14/2015 FINAL  Final  Culture, blood (routine x 2)     Status: None   Collection Time: 05/09/15 11:24 PM  Result Value Ref Range Status   Specimen Description BLOOD LEFT ASSIST CONTROL  Final   Special Requests BOTTLES DRAWN AEROBIC AND ANAEROBIC 5CCAERO,4CCANA  Final   Culture NO GROWTH 5 DAYS  Final   Report Status 05/14/2015 FINAL  Final    RADIOLOGY:  US Renal  05/13/2015  CLINICAL DATA:  Bilateral flank pain right greater than left for 1 week EXAM: RENAL / URINARY TRACT ULTRASOUND COMPLETE COMPARISON:  CT scan 05/09/2015 FINDINGS: Right Kidney: Length: 13.4 cm. There is normal echogenicity. There is a cyst in upper pole measures 1.4 x 2 cm. A smaller cyst is noted in lower pole. No hydronephrosis. No renal calculi. Left Kidney: Length: 13.8 cm in length. There is normal echogenicity. A small cyst  is noted in midpole measures 1 x 1.2 cm. There is faint some medullary echogenic calcification in midpole without definite evidence of a mass. Bladder: Appears normal for degree of bladder distention. IMPRESSION: 1. No hydronephrosis. At least 2 cysts are noted within right kidney the largest in upper pole measures 2 cm. No right renal calculus. 2. No left hydronephrosis. Small cyst in midpole measures 1.2 cm. Faint small area of medullary echogenic calcification in midpole without evidence of mass. 3. Unremarkable urinary bladder. Electronically Signed   By: Lahoma Crocker M.D.   On: 05/13/2015 14:33     Management plans discussed with the patient, family and they are in agreement.  CODE STATUS:     Code Status Orders        Start     Ordered   05/10/15 0101  Full code   Continuous     05/10/15 0100    Code Status History    Date Active Date Inactive Code Status Order ID Comments User Context   This patient has a current code status but no historical code status.      TOTAL TIME TAKING CARE OF THIS PATIENT: 40 minutes.    Jafet Wissing M.D on 05/14/2015 at 1:04 PM  Between 7am to 6pm - Pager - [phone] After 6pm go to [email or web address] - password [email or web address] [facility]  Office  [phone]  CC: Primary care physician; Enid Derry, MD

## 2015-05-18 LAB — C3 COMPLEMENT: C3 Complement: 140 mg/dL (ref 82–167)

## 2015-05-18 LAB — C4 COMPLEMENT: Complement C4, Body Fluid: 27 mg/dL (ref 14–44)

## 2015-05-18 LAB — MPO/PR-3 (ANCA) ANTIBODIES
ANCA Proteinase 3: <3.5 U/mL (ref 0.0–3.5)
Myeloperoxidase Abs: <9 U/mL (ref 0.0–9.0)

## 2015-05-18 LAB — KAPPA/LAMBDA LIGHT CHAINS
Kappa free light chain: 19.83 mg/L — ABNORMAL HIGH (ref 3.30–19.40)
Kappa, lambda light chain ratio: 1.27 (ref 0.26–1.65)
LAMDA FREE LIGHT CHAINS: 15.66 mg/L (ref 5.71–26.30)

## 2015-05-18 LAB — HIV ANTIBODY (ROUTINE TESTING W REFLEX): HIV Screen 4th Generation wRfx: NONREACTIVE

## 2015-05-18 LAB — COMPLEMENT, TOTAL

## 2015-05-18 LAB — GLOMERULAR BASEMENT MEMBRANE ANTIBODIES: GBM AB: 4 U (ref 0–20)

## 2015-05-18 LAB — ANA W/REFLEX IF POSITIVE: ANA: NEGATIVE

## 2015-05-18 LAB — HEPATITIS B SURFACE ANTIBODY,QUALITATIVE: Hep B S Ab: REACTIVE

## 2015-05-18 LAB — ANTISTREPTOLYSIN O TITER: ASO: 33 IU/mL (ref 0.0–200.0)

## 2015-05-18 LAB — HEPATITIS B CORE ANTIBODY, IGM: Hep B C IgM: NEGATIVE

## 2015-05-18 LAB — CRYOGLOBULIN

## 2015-05-21 ENCOUNTER — Ambulatory Visit (INDEPENDENT_AMBULATORY_CARE_PROVIDER_SITE_OTHER): Payer: BLUE CROSS/BLUE SHIELD | Admitting: Family Medicine

## 2015-05-21 ENCOUNTER — Encounter: Payer: Self-pay | Admitting: Family Medicine

## 2015-05-21 VITALS — BP 145/85 | HR 53 | Temp 97.1°F | Wt 229.0 lb

## 2015-05-21 DIAGNOSIS — E669 Obesity, unspecified: Secondary | ICD-10-CM

## 2015-05-21 DIAGNOSIS — I10 Essential (primary) hypertension: Secondary | ICD-10-CM

## 2015-05-21 DIAGNOSIS — N12 Tubulo-interstitial nephritis, not specified as acute or chronic: Secondary | ICD-10-CM

## 2015-05-21 DIAGNOSIS — R748 Abnormal levels of other serum enzymes: Secondary | ICD-10-CM

## 2015-05-21 DIAGNOSIS — N179 Acute kidney failure, unspecified: Secondary | ICD-10-CM | POA: Diagnosis not present

## 2015-05-21 NOTE — Patient Instructions (Addendum)
Please let the kidney doctor know that I'd like to have your liver function rechecked, if they don't mind adding that on to your labs tomorrow Your goal blood pressure is less than 140 mmHg on top. Try to follow the DASH guidelines (DASH stands for Dietary Approaches to Stop Hypertension) Try to limit the sodium in your diet.  Ideally, consume less than 1.5 grams (less than 1,500mg ) per day. Do not add salt when cooking or at the table.  Check the sodium amount on labels when shopping, and choose items lower in sodium when given a choice. Avoid or limit foods that already contain a lot of sodium. Eat a diet rich in fruits and vegetables and whole grains. Try to limit saturated fats in your diet (bologna, hot dogs, barbeque, cheeseburgers, hamburgers, steak, bacon, sausage, cheese, etc.) and get more fresh fruits, vegetables, and whole grains

## 2015-05-21 NOTE — Progress Notes (Signed)
BP 145/85 mmHg  Pulse 53  Temp(Src) 97.1 F (36.2 C)  Wt 229 lb (103.874 kg)  SpO2 98%   Subjective:    Patient ID: David [name], male    DOB: 1971/01/24, 45 y.o.   MRN: [id number]  HPI: David [name] is a 45 y.o. male  Chief Complaint  Patient presents with  . Hospitalization Follow-up    Pyelonephritis, he is feeling a little better.   When patient was last seen here in the office, EMS was called and took him to the ER; he was released that same day (Jan 20th), but his wife took him back to the ER the next day and he was admitted; he was hospitalized from Jan 21st to Jan 26th; his admission diagnosis was pyelonephritis, but discharge diagnoses include acute renal failure, HTN, and bilateral perinephric stranding likely inflammatory with multiple renal cysts; we reviewed his hospital records  His urine culture did not grow out significant infection, only 4k colonies, he was treated with Rocephin, but urologist said further antibiotics were not indicated; he is going to have a cystoscopy done as an outpatient, and then repeat CT scan as per the urology consultation; he goes to see urologist next week  His acute renal failure was considered to be due to prerenal azotemia in combination with acute tubular necrosis from IV contrast; he was seen by nephrology in the hospital as well; he sees nephrologist tomorrow for hospital f/u; wife says they are still waiting on an "unusual" urine test  Since leaving the hospital, he has had no fevers Back is still bothering him a little bit; no abdominal pain; he has not seen any hematuria  Labs from hospital stay reviewed Blood cultures negative x 2 after five days 1+ blood on urine, 100 protein His liver enzymes were elevated at the time of his hospital stay; ALT 102, AST 62  Lab Results  Component Value Date   WBC 6.2 05/13/2015   HGB 14.5 05/13/2015   HCT 43.6 05/13/2015   MCV 84.3 05/13/2015   PLT 208 05/13/2015   Radiology reports  reviewed  CLINICAL DATA: Bilateral flank pain right greater than left for 1 week  EXAM: RENAL / URINARY TRACT ULTRASOUND COMPLETE COMPARISON: CT scan 05/09/2015 FINDINGS: Right Kidney: Length: 13.4 cm. There is normal echogenicity. There is a cyst in upper pole measures 1.4 x 2 cm. A smaller cyst is noted in lower pole. No hydronephrosis. No renal calculi.  Left Kidney: Length: 13.8 cm in length. There is normal echogenicity. A small cyst is noted in midpole measures 1 x 1.2 cm. There is faint some medullary echogenic calcification in midpole without definite evidence of a mass.  Bladder: Appears normal for degree of bladder distention.  IMPRESSION: 1. No hydronephrosis. At least 2 cysts are noted within right kidney the largest in upper pole measures 2 cm. No right renal calculus. 2. No left hydronephrosis. Small cyst in midpole measures 1.2 cm. Faint small area of medullary echogenic calcification in midpole without evidence of mass. 3. Unremarkable urinary bladder.  Electronically Signed  By: Lahoma Crocker M.D.  On: 05/13/2015 14:33 -----------------------------  CLINICAL DATA: Right upper quadrant right flank pain for 2 days with nausea EXAM: CT ABDOMEN AND PELVIS WITH CONTRAST  TECHNIQUE: Multidetector CT imaging of the abdomen and pelvis was performed using the standard protocol following bolus administration of intravenous contrast. CONTRAST: 157mL OMNIPAQUE IOHEXOL 300 MG/ML SOLN  COMPARISON: None.  FINDINGS: Lower chest: Negative Hepatobiliary: 5 mm low-attenuation lesion image  number 25 lateral left lobe of liver too small to characterize possibly a cyst. Pancreas: Negative Spleen: Negative Adrenals/Urinary Tract: Normal adrenal glands. Severe bilateral perinephric inflammatory change. Several bilateral low-attenuation lesions measure between 5 and 15 mm. The largest is in the right upper pole with average attenuation value of 16.  Inflammatory change tracks along the proximal ureters. Bladder is normal the there is no inflammation around the bladder. Stomach/Bowel: Normal Vascular/Lymphatic: No significant vascular abnormalities. There is a celiac axis lymph node measuring 7 mm. There are numerous nonpathologic retroperitoneal periaortic and aortocaval lymph nodes conspicuous only by number. Reproductive: No acute finding Other: No free fluid Musculoskeletal: No acute findings  IMPRESSION: There is severe bilateral symmetric perinephric inflammatory change. There are numerous bilateral low-attenuation renal lesions, the majority of which are 5 mm or smaller, likely cysts. The kidneys are normal in size at about 12cm. The bilaterality suggests a systemic process causing bilateral renal inflammation; bilateral infectious pyelonephritis is possible but less likely, and there is no evidence of cystitis.  Electronically Signed  By: Skipper Cliche M.D.  On: 05/09/2015 19:46  He had back pain on Saturday after the first hospital stay; hx of back pain; no blood in stool; urinating fine  Relevant past medical, surgical, family and social history reviewed and updated as indicated Family History  Problem Relation Age of Onset  . Hypertension Mother   . Kidney disease Father   . Hypertension Father   . Dementia Father   . Squamous cell carcinoma Father   . Cancer Father     SCC  . Diabetes Father   . Heart disease Father   . Cancer Brother     hodgkin's lymphoma  . Heart disease Brother 65    3 vessel CABG  . Heart disease Maternal Grandmother   . Stroke Maternal Grandmother   . Heart disease Maternal Grandfather   . Stroke Maternal Grandfather   . Heart disease Paternal Grandmother   . Heart disease Paternal Grandfather   of note, father had kidney disease  Interim medical history since our last visit reviewed. Allergies and medications reviewed and updated.  Review of Systems Per HPI unless  specifically indicated above     Objective:    BP 145/85 mmHg  Pulse 53  Temp(Src) 97.1 F (36.2 C)  Wt 229 lb (103.874 kg)  SpO2 98%    Physical Exam  Constitutional: He appears well-developed and well-nourished. No distress.  Eyes: No scleral icterus.  Neck: No JVD present.  Cardiovascular: Regular rhythm.  Bradycardia present.   Pulmonary/Chest: Effort normal and breath sounds normal.  Abdominal: He exhibits no distension. There is no tenderness.  Musculoskeletal: He exhibits no edema.  Neurological: He is alert.  Skin: Skin is warm and dry. He is not diaphoretic. No pallor.  Psychiatric: He has a normal mood and affect.   Results for orders placed or performed during the hospital encounter of 05/09/15  Urine culture  Result Value Ref Range   Specimen Description URINE, RANDOM    Special Requests Normal    Culture 4,000 COLONIES/mL INSIGNIFICANT GROWTH    Report Status 05/11/2015 FINAL   Culture, blood (routine x 2)  Result Value Ref Range   Specimen Description BLOOD RIGHT ASSIST CONTROL    Special Requests BOTTLES DRAWN AEROBIC AND ANAEROBIC 7CCAERO,2CCANA    Culture NO GROWTH 5 DAYS    Report Status 05/14/2015 FINAL   Culture, blood (routine x 2)  Result Value Ref Range   Specimen Description BLOOD LEFT  ASSIST CONTROL    Special Requests BOTTLES DRAWN AEROBIC AND ANAEROBIC 5CCAERO,4CCANA    Culture NO GROWTH 5 DAYS    Report Status 05/14/2015 FINAL   CBC  Result Value Ref Range   WBC 13.4 (H) 3.8 - 10.6 K/uL   RBC 5.23 4.40 - 5.90 MIL/uL   Hemoglobin 14.3 13.0 - 18.0 g/dL   HCT 43.3 40.0 - 52.0 %   MCV 82.8 80.0 - 100.0 fL   MCH 27.4 26.0 - 34.0 pg   MCHC 33.1 32.0 - 36.0 g/dL   RDW 13.3 11.5 - 14.5 %   Platelets 200 150 - 440 K/uL  Troponin I  Result Value Ref Range   Troponin I <0.03 <0.031 ng/mL  Comprehensive metabolic panel  Result Value Ref Range   Sodium 143 135 - 145 mmol/L   Potassium 4.1 3.5 - 5.1 mmol/L   Chloride 108 101 - 111 mmol/L    CO2 26 22 - 32 mmol/L   Glucose, Bld 101 (H) 65 - 99 mg/dL   BUN 21 (H) 6 - 20 mg/dL   Creatinine, Ser 1.47 (H) 0.61 - 1.24 mg/dL   Calcium 9.5 8.9 - 10.3 mg/dL   Total Protein 7.8 6.5 - 8.1 g/dL   Albumin 4.6 3.5 - 5.0 g/dL   AST 62 (H) 15 - 41 U/L   ALT 102 (H) 17 - 63 U/L   Alkaline Phosphatase 78 38 - 126 U/L   Total Bilirubin 0.3 0.3 - 1.2 mg/dL   GFR calc non Af Amer 56 (L) >60 mL/min   GFR calc Af Amer >60 >60 mL/min   Anion gap 9 5 - 15  Lipase, blood  Result Value Ref Range   Lipase 24 11 - 51 U/L  Urinalysis complete, with microscopic ([facility] only)  Result Value Ref Range   Color, Urine STRAW (A) YELLOW   APPearance CLEAR (A) CLEAR   Glucose, UA NEGATIVE NEGATIVE mg/dL   Bilirubin Urine NEGATIVE NEGATIVE   Ketones, ur NEGATIVE NEGATIVE mg/dL   Specific Gravity, Urine 1.023 1.005 - 1.030   Hgb urine dipstick 1+ (A) NEGATIVE   pH 5.0 5.0 - 8.0   Protein, ur 100 (A) NEGATIVE mg/dL   Nitrite NEGATIVE NEGATIVE   Leukocytes, UA NEGATIVE NEGATIVE   RBC / HPF 6-30 0 - 5 RBC/hpf   WBC, UA 0-5 0 - 5 WBC/hpf   Bacteria, UA NONE SEEN NONE SEEN   Squamous Epithelial / LPF 0-5 (A) NONE SEEN   Mucous PRESENT   Basic metabolic panel  Result Value Ref Range   Sodium 139 135 - 145 mmol/L   Potassium 4.4 3.5 - 5.1 mmol/L   Chloride 107 101 - 111 mmol/L   CO2 25 22 - 32 mmol/L   Glucose, Bld 110 (H) 65 - 99 mg/dL   BUN 27 (H) 6 - 20 mg/dL   Creatinine, Ser 2.30 (H) 0.61 - 1.24 mg/dL   Calcium 8.8 (L) 8.9 - 10.3 mg/dL   GFR calc non Af Amer 32 (L) >60 mL/min   GFR calc Af Amer 37 (L) >60 mL/min   Anion gap 7 5 - 15  CBC  Result Value Ref Range   WBC 9.6 3.8 - 10.6 K/uL   RBC 4.78 4.40 - 5.90 MIL/uL   Hemoglobin 13.7 13.0 - 18.0 g/dL   HCT 40.0 40.0 - 52.0 %   MCV 83.8 80.0 - 100.0 fL   MCH 28.7 26.0 - 34.0 pg   MCHC 34.3 32.0 - 36.0  g/dL   RDW 13.7 11.5 - 14.5 %   Platelets 167 150 - 440 K/uL  Glucose, capillary  Result Value Ref Range   Glucose-Capillary 107 (H) 65 -  99 mg/dL  Glucose, capillary  Result Value Ref Range   Glucose-Capillary 82 65 - 99 mg/dL   Comment 1 Notify RN   Mpo/pr-3 (anca) antibodies  Result Value Ref Range   Myeloperoxidase Abs <9.0 0.0 - 9.0 U/mL   ANCA Proteinase 3 <3.5 0.0 - 3.5 U/mL  ANA w/Reflex if Positive  Result Value Ref Range   Anit Nuclear Antibody(ANA) Negative Negative  C3 complement  Result Value Ref Range   C3 Complement 140 82 - 167 mg/dL  C4 complement  Result Value Ref Range   Complement C4, Body Fluid 27 14 - 44 mg/dL  Complement, total  Result Value Ref Range   Compl, Total (CH50) > 60 42 - 60 U/mL  Glomerular basement membrane antibodies  Result Value Ref Range   GBM Ab 4 0 - 20 units  Antistreptolysin O titer  Result Value Ref Range   ASO 33.0 0.0 - 200.0 IU/mL  HIV antibody  Result Value Ref Range   HIV Screen 4th Generation wRfx Non Reactive Non Reactive  Cryoglobulin  Result Value Ref Range   Cryoglobulin Comment None detected  Hepatitis B surface antigen  Result Value Ref Range   Hepatitis B Surface Ag Negative Negative  Hepatitis B surface antibody  Result Value Ref Range   Hep B S Ab Reactive   Hepatitis B core antibody, IgM  Result Value Ref Range   Hep B C IgM Negative Negative  Protein / creatinine ratio, urine  Result Value Ref Range   Creatinine, Urine 105 mg/dL   Total Protein, Urine 40 mg/dL   Protein Creatinine Ratio 0.38 (H) 0.00 - 0.15 mg/mg[Cre]  Kappa/lambda light chains  Result Value Ref Range   Kappa free light chain 19.83 (H) 3.30 - 19.40 mg/L   Lamda free light chains 15.66 5.71 - 26.30 mg/L   Kappa, lamda light chain ratio 1.27 0.26 - 1.65  Glucose, capillary  Result Value Ref Range   Glucose-Capillary 103 (H) 65 - 99 mg/dL   Comment 1 Notify RN   Basic metabolic panel  Result Value Ref Range   Sodium 141 135 - 145 mmol/L   Potassium 4.4 3.5 - 5.1 mmol/L   Chloride 109 101 - 111 mmol/L   CO2 27 22 - 32 mmol/L   Glucose, Bld 103 (H) 65 - 99 mg/dL    BUN 23 (H) 6 - 20 mg/dL   Creatinine, Ser 2.13 (H) 0.61 - 1.24 mg/dL   Calcium 8.4 (L) 8.9 - 10.3 mg/dL   GFR calc non Af Amer 35 (L) >60 mL/min   GFR calc Af Amer 41 (L) >60 mL/min   Anion gap 5 5 - 15  Glucose, capillary  Result Value Ref Range   Glucose-Capillary 99 65 - 99 mg/dL  Glucose, capillary  Result Value Ref Range   Glucose-Capillary 90 65 - 99 mg/dL   Comment 1 Notify RN   Glucose, capillary  Result Value Ref Range   Glucose-Capillary 116 (H) 65 - 99 mg/dL   Comment 1 Notify RN   Glucose, capillary  Result Value Ref Range   Glucose-Capillary 92 65 - 99 mg/dL   Comment 1 Notify RN   Basic metabolic panel  Result Value Ref Range   Sodium 142 135 - 145 mmol/L   Potassium 3.9 3.5 -  5.1 mmol/L   Chloride 108 101 - 111 mmol/L   CO2 26 22 - 32 mmol/L   Glucose, Bld 97 65 - 99 mg/dL   BUN 16 6 - 20 mg/dL   Creatinine, Ser 1.43 (H) 0.61 - 1.24 mg/dL   Calcium 8.9 8.9 - 10.3 mg/dL   GFR calc non Af Amer 57 (L) >60 mL/min   GFR calc Af Amer >60 >60 mL/min   Anion gap 8 5 - 15  Glucose, capillary  Result Value Ref Range   Glucose-Capillary 89 65 - 99 mg/dL  Glucose, capillary  Result Value Ref Range   Glucose-Capillary 80 65 - 99 mg/dL  Glucose, capillary  Result Value Ref Range   Glucose-Capillary 98 65 - 99 mg/dL  Glucose, capillary  Result Value Ref Range   Glucose-Capillary 80 65 - 99 mg/dL  CBC with Differential/Platelet  Result Value Ref Range   WBC 6.2 3.8 - 10.6 K/uL   RBC 5.17 4.40 - 5.90 MIL/uL   Hemoglobin 14.5 13.0 - 18.0 g/dL   HCT 43.6 40.0 - 52.0 %   MCV 84.3 80.0 - 100.0 fL   MCH 28.1 26.0 - 34.0 pg   MCHC 33.3 32.0 - 36.0 g/dL   RDW 13.6 11.5 - 14.5 %   Platelets 208 150 - 440 K/uL   Neutrophils Relative % 55 %   Neutro Abs 3.4 1.4 - 6.5 K/uL   Lymphocytes Relative 29 %   Lymphs Abs 1.8 1.0 - 3.6 K/uL   Monocytes Relative 12 %   Monocytes Absolute 0.7 0.2 - 1.0 K/uL   Eosinophils Relative 5 %   Eosinophils Absolute 0.3 0 - 0.7 K/uL    Basophils Relative 1 %   Basophils Absolute 0.0 0 - 0.1 K/uL  Basic metabolic panel  Result Value Ref Range   Sodium 136 135 - 145 mmol/L   Potassium 3.9 3.5 - 5.1 mmol/L   Chloride 101 101 - 111 mmol/L   CO2 27 22 - 32 mmol/L   Glucose, Bld 93 65 - 99 mg/dL   BUN 17 6 - 20 mg/dL   Creatinine, Ser 1.43 (H) 0.61 - 1.24 mg/dL   Calcium 9.2 8.9 - 10.3 mg/dL   GFR calc non Af Amer 57 (L) >60 mL/min   GFR calc Af Amer >60 >60 mL/min   Anion gap 8 5 - 15  Eosinophil, Urine  Result Value Ref Range   Eosinophil, Urine No Eosinophils Seen %  Glucose, capillary  Result Value Ref Range   Glucose-Capillary 86 65 - 99 mg/dL  Glucose, capillary  Result Value Ref Range   Glucose-Capillary 76 65 - 99 mg/dL   Comment 1 Notify RN   Glucose, capillary  Result Value Ref Range   Glucose-Capillary 85 65 - 99 mg/dL   Comment 1 Notify RN   Glucose, capillary  Result Value Ref Range   Glucose-Capillary 95 65 - 99 mg/dL   Comment 1 Notify RN   Glucose, capillary  Result Value Ref Range   Glucose-Capillary 93 65 - 99 mg/dL  Glucose, capillary  Result Value Ref Range   Glucose-Capillary 72 65 - 99 mg/dL  Glucose, capillary  Result Value Ref Range   Glucose-Capillary 106 (H) 65 - 99 mg/dL      Assessment & Plan:   Problem List Items Addressed This Visit      Cardiovascular and Mediastinum   Hypertension    Still not to goal; he will stay on current med, see nephrologist tomorrow;  DASH guidelines, weight loss would help; avoid NSAIDs, decongestants        Genitourinary   Pyelonephritis    Listed as hospital admission diagnosis, but urine culture did not grow out significant infection, and this was believed to not be accurate; he will follow-up with urologist and undergo outpatient repeat CT scan and cystoscopy; he denies fevers      AKI (acute kidney injury) ([place]) - Primary    Related to acute tubular necrosis and prerenal azotemia; hydrated in the hospital, goes to see nephrologist  tomorrow        Other   Obesity    Encouraged modest weight loss      Elevated liver enzymes    Noted during hospital stay; he is seeing nephrologist tomorrow, so rather than draw blood today, I have asked him and his wife to see if they can draw hepatic function panel with other labs tomorrow         Follow up plan: Return in about 4 weeks (around 06/18/2015) for follow-up.  An after-visit summary was printed and given to the patient at [facility].  Please see the patient instructions which may contain other information and recommendations beyond what is mentioned above in the assessment and plan.  Face-to-face time with patient was more than 25 minutes, >50% time spent counseling and coordination of care

## 2015-05-27 ENCOUNTER — Encounter: Payer: Self-pay | Admitting: Nephrology

## 2015-05-29 ENCOUNTER — Encounter: Payer: Self-pay | Admitting: Obstetrics and Gynecology

## 2015-05-29 ENCOUNTER — Ambulatory Visit (INDEPENDENT_AMBULATORY_CARE_PROVIDER_SITE_OTHER): Payer: BLUE CROSS/BLUE SHIELD | Admitting: Obstetrics and Gynecology

## 2015-05-29 VITALS — BP 149/88 | HR 69 | Ht 74.0 in | Wt 232.2 lb

## 2015-05-29 DIAGNOSIS — R7989 Other specified abnormal findings of blood chemistry: Secondary | ICD-10-CM

## 2015-05-29 DIAGNOSIS — R109 Unspecified abdominal pain: Secondary | ICD-10-CM

## 2015-05-29 DIAGNOSIS — R748 Abnormal levels of other serum enzymes: Secondary | ICD-10-CM | POA: Diagnosis not present

## 2015-05-29 DIAGNOSIS — R3129 Other microscopic hematuria: Secondary | ICD-10-CM

## 2015-05-29 NOTE — Progress Notes (Signed)
05/29/2015 4:30 PM   David [name] February 05, 1971 [id number]  Referring provider: Arnetha Courser, MD [address] [place], [place] [address]  Chief Complaint  Patient presents with  . Peylonephritis    New Patient  . Hospitalization Follow-up    HPI: David [name] is a 45 y.o. year old admitted on 05/09/15 with right worse than left bilateral flank pain, nausea, vomiting without fevers found to have significant perinephric stranding around both kidneys in the absence of pyuria, voiding symptoms, fevers, or positive blood or urine cultures. He did have mild leukocytosis to 13 which has improved as well. He reports symptoms had been going on for about 4 days and happened the  Month prior as well but resolved spontaneously.   CT abd/ pelvis w/ contrast on 05/09/15 shows normal 12 cm kidneys with multiple low-attenuating lesions bilaterally (cyst vs. Early abscess) with significant bilateral perinephric stranding including around the proximal ureters without hydronephrosis.   His Cr was also rising with peak of 2.3 following administration of IV contrast and vomiting. Renal function improved prior to discharge.   He did complain of night sweats over the past month but no weight loss.   Family history of ESRD in father, unclear etiology.   No travel history. No history of substance abuse or IVDU.   He presents today for follow-up after recent hospital admission.   No urinary symptoms since discharge.   He has experienced decreased appetite and fatigue. Also reports chronic squeezing right flank pain with occasional sharper pains.  PMH: Past Medical History  Diagnosis Date  . Gout   . Gynecomastia, male left  . Eczema   . Hyperlipidemia   . Hypertension   . Snoring   . History of pyelonephritis Jan 2017    Surgical History: Past Surgical History  Procedure Laterality Date  . No past surgeries      Home Medications:    Medication List       This list is accurate as of: 05/29/15   4:30 PM.  Always use your most recent med list.               amLODipine 10 MG tablet  Commonly known as:  NORVASC  Take 1 tablet (10 mg total) by mouth daily.     HYDROcodone-acetaminophen 5-325 MG tablet  Commonly known as:  NORCO/VICODIN  Take 1 tablet by mouth every 4 (four) hours as needed for moderate pain.     losartan 50 MG tablet  Commonly known as:  COZAAR  Take 1 tablet (50 mg total) by mouth daily.        Allergies: No Known Allergies  Family History: Family History  Problem Relation Age of Onset  . Hypertension Mother   . Kidney disease Father   . Hypertension Father   . Dementia Father   . Squamous cell carcinoma Father   . Cancer Father     SCC  . Diabetes Father   . Heart disease Father   . Cancer Brother     hodgkin's lymphoma  . Heart disease Brother 61    3 vessel CABG  . Heart disease Maternal Grandmother   . Stroke Maternal Grandmother   . Heart disease Maternal Grandfather   . Stroke Maternal Grandfather   . Heart disease Paternal Grandmother   . Heart disease Paternal Grandfather     Social History:  reports that he has never smoked. He has quit using smokeless tobacco. His smokeless tobacco use included Chew.  He reports that he does not drink alcohol or use illicit drugs.  ROS: UROLOGY Frequent Urination?: No Hard to postpone urination?: No Burning/pain with urination?: No Get up at night to urinate?: No Leakage of urine?: No Urine stream starts and stops?: No Trouble starting stream?: No Do you have to strain to urinate?: No Blood in urine?: No Urinary tract infection?: No Sexually transmitted disease?: No Injury to kidneys or bladder?: No Painful intercourse?: No Weak stream?: No Erection problems?: No Penile pain?: No  Gastrointestinal Nausea?: No Vomiting?: No Indigestion/heartburn?: No Diarrhea?: No Constipation?: No  Constitutional Fever: No Night sweats?: No Weight loss?: No Fatigue?: Yes  Skin Skin  rash/lesions?: Yes Itching?: No  Eyes Blurred vision?: No Double vision?: No  Ears/Nose/Throat Sore throat?: No Sinus problems?: No  Hematologic/Lymphatic Swollen glands?: No Easy bruising?: No  Cardiovascular Leg swelling?: No Chest pain?: No  Respiratory Cough?: No Shortness of breath?: No  Endocrine Excessive thirst?: No  Musculoskeletal Back pain?: Yes Joint pain?: No  Neurological Headaches?: No Dizziness?: No  Psychologic Depression?: No Anxiety?: No  Physical Exam: BP 149/88 mmHg  Pulse 69  Ht 6\' 2"  (1.88 m)  Wt 232 lb 3.2 oz (105.325 kg)  BMI 29.80 kg/m2  Constitutional:  Alert and oriented, No acute distress. HEENT: [place] AT, moist mucus membranes.  Trachea midline, no masses. Cardiovascular: No clubbing, cyanosis, or edema. Respiratory: Normal respiratory effort, no increased work of breathing. GI: Abdomen is soft, nontender, nondistended, no abdominal masses GU: Right CVA tenderness.  Skin: No rashes, bruises or suspicious lesions. Neurologic: Grossly intact, no focal deficits, moving all 4 extremities. Psychiatric: Normal mood and affect.  Laboratory Data:  Lab Results  Component Value Date   WBC 6.2 05/13/2015   HGB 14.5 05/13/2015   HCT 43.6 05/13/2015   MCV 84.3 05/13/2015   PLT 208 05/13/2015    Lab Results  Component Value Date   CREATININE 1.43* 05/13/2015    No results found for: PSA  No results found for: TESTOSTERONE  No results found for: HGBA1C  Urinalysis    Component Value Date/Time   COLORURINE STRAW* 05/09/2015 2209   APPEARANCEUR CLEAR* 05/09/2015 2209   LABSPEC 1.023 05/09/2015 2209   PHURINE 5.0 05/09/2015 2209   GLUCOSEU NEGATIVE 05/09/2015 2209   HGBUR 1+* 05/09/2015 2209   BILIRUBINUR NEGATIVE 05/09/2015 2209   [place] 05/09/2015 2209   PROTEINUR 100* 05/09/2015 2209   NITRITE NEGATIVE 05/09/2015 2209   LEUKOCYTESUR NEGATIVE 05/09/2015 2209    Pertinent Imaging:   Assessment & Plan:     1. Flank Pain-  Continued squeezing pain.  Follow up images pending Dr. Erlene Quan recommendations.  2.  Microscopic hematuria-  We will plan for cystoscopy with Dr. Erlene Quan as recommended in her last note. He was told by his nephrologist that he should never be administered IV contrast dye. He may need to undergo a repeat CT scan without contrast. Patient will discuss this further with Dr. Erlene Quan at his follow-up for cystoscopy appointment.  3. Elevated Creatinine- Rechecked by Nephrology on 05/22/15 Cr 0.95.   There are no diagnoses linked to this encounter.  Return for f/u for cystoscopy with Dr. Erlene Quan .  These notes generated with voice recognition software. I apologize for typographical errors.  Herbert Moors, [facility] [address], [place], [place] [address] [phone]

## 2015-05-30 LAB — URINALYSIS, COMPLETE
BILIRUBIN UA: NEGATIVE
Glucose, UA: NEGATIVE
KETONES UA: NEGATIVE
LEUKOCYTES UA: NEGATIVE
Nitrite, UA: NEGATIVE
PROTEIN UA: NEGATIVE
SPEC GRAV UA: 1.02 (ref 1.005–1.030)
Urobilinogen, Ur: 0.2 mg/dL (ref 0.2–1.0)
pH, UA: 7 (ref 5.0–7.5)

## 2015-05-30 LAB — MICROSCOPIC EXAMINATION
Bacteria, UA: NONE SEEN
Epithelial Cells (non renal): NONE SEEN /hpf (ref 0–10)
RBC, UA: NONE SEEN /hpf (ref 0–?)
Renal Epithel, UA: NONE SEEN /hpf
WBC, UA: NONE SEEN /hpf (ref 0–?)

## 2015-05-31 DIAGNOSIS — R748 Abnormal levels of other serum enzymes: Secondary | ICD-10-CM | POA: Insufficient documentation

## 2015-05-31 NOTE — Assessment & Plan Note (Signed)
Encouraged modest weight loss 

## 2015-05-31 NOTE — Assessment & Plan Note (Signed)
Still not to goal; he will stay on current med, see nephrologist tomorrow; DASH guidelines, weight loss would help; avoid NSAIDs, decongestants

## 2015-05-31 NOTE — Assessment & Plan Note (Signed)
Listed as hospital admission diagnosis, but urine culture did not grow out significant infection, and this was believed to not be accurate; he will follow-up with urologist and undergo outpatient repeat CT scan and cystoscopy; he denies fevers

## 2015-05-31 NOTE — Assessment & Plan Note (Signed)
Related to acute tubular necrosis and prerenal azotemia; hydrated in the hospital, goes to see nephrologist tomorrow

## 2015-05-31 NOTE — Assessment & Plan Note (Signed)
Noted during hospital stay; he is seeing nephrologist tomorrow, so rather than draw blood today, I have asked him and his wife to see if they can draw hepatic function panel with other labs tomorrow

## 2015-06-01 ENCOUNTER — Telehealth: Payer: Self-pay | Admitting: Urology

## 2015-06-01 DIAGNOSIS — R109 Unspecified abdominal pain: Secondary | ICD-10-CM

## 2015-06-01 NOTE — Telephone Encounter (Signed)
Patient is calling today with complaint of mid back pain that radiates to lower back on right side. Pain scale 5/10.  He is requesting medication for the pain until he has a chance to see Dr. Erlene Quan.  Pharmacy: [facility], [place], [place]  Patient was seen by Herbert Moors, NP as a new patient.  He is scheduled for a cysto on 06/16/2015 with Dr. Erlene Quan.

## 2015-06-05 MED ORDER — HYDROCODONE-ACETAMINOPHEN 5-325 MG PO TABS: 1.0000 | ORAL_TABLET | ORAL | Status: DC | PRN

## 2015-06-05 NOTE — Telephone Encounter (Signed)
Pt called again this morning stating that he has not heard from anyone from the phone call he placed to [place] on 2/13.  He is still in pain and would like for someone to return his call.  Pain is still 5/10 but is getting where it is not tolerable anymore.  Please call patient.

## 2015-06-05 NOTE — Telephone Encounter (Signed)
Spoke with patient and he is sending his wife David [name] to pick up his script.   [name]

## 2015-06-05 NOTE — Telephone Encounter (Signed)
Please notify patient that I provided a prescription for Narco for him to pick up the front desk. I would also like to go ahead and schedule his CT scan which he can hopefully Week prior to his appointment with Dr. Erlene Quan for further evaluation of his worsening pain. Should the pain become very severe over the weekend, he developed fevers, uncontrolled nausea or vomiting he needs to seek immediate medical attention. thanks

## 2015-06-15 ENCOUNTER — Ambulatory Visit
Admission: RE | Admit: 2015-06-15 | Discharge: 2015-06-15 | Disposition: A | Discharge status: Home / Self Care | Payer: BLUE CROSS/BLUE SHIELD | Source: Ambulatory Visit | Attending: Obstetrics and Gynecology | Admitting: Obstetrics and Gynecology

## 2015-06-15 DIAGNOSIS — R109 Unspecified abdominal pain: Secondary | ICD-10-CM | POA: Diagnosis not present

## 2015-06-15 DIAGNOSIS — N2889 Other specified disorders of kidney and ureter: Secondary | ICD-10-CM | POA: Diagnosis not present

## 2015-06-16 ENCOUNTER — Ambulatory Visit (INDEPENDENT_AMBULATORY_CARE_PROVIDER_SITE_OTHER): Payer: BLUE CROSS/BLUE SHIELD | Admitting: Urology

## 2015-06-16 VITALS — BP 163/91 | HR 66 | Ht 74.0 in | Wt 226.0 lb

## 2015-06-16 DIAGNOSIS — R109 Unspecified abdominal pain: Secondary | ICD-10-CM

## 2015-06-16 DIAGNOSIS — R3129 Other microscopic hematuria: Secondary | ICD-10-CM

## 2015-06-16 DIAGNOSIS — R21 Rash and other nonspecific skin eruption: Secondary | ICD-10-CM | POA: Diagnosis not present

## 2015-06-16 MED ORDER — CIPROFLOXACIN HCL 500 MG PO TABS
500.0000 mg | ORAL_TABLET | Freq: Once | ORAL | Status: AC
  Administered 2015-06-16: 500 mg via ORAL

## 2015-06-16 MED ORDER — LIDOCAINE HCL 2 % EX GEL
1.0000 "application " | Freq: Once | CUTANEOUS | Status: AC
  Administered 2015-06-16: 1 via URETHRAL

## 2015-06-16 NOTE — Progress Notes (Signed)
8:07 PM  06/16/15  David [name] Apr 27, 1970 [id number]  Referring provider: Arnetha Courser, MD [address] [place], [place] [address]  Chief Complaint  Patient presents with  . Cysto    HPI: 45 y.o. year old admitted on 05/09/15 with right worse than left bilateral flank pain, nausea, vomiting without fevers found to have significant perinephric stranding around both kidneys in the absence of pyuria, voiding symptoms, fevers, or positive blood or urine cultures. He did have mild leukocytosis to 13 which has improved as well.    CT abd/ pelvis w/ contrast on 05/09/15 shows normal 12 cm kidneys with multiple low-attenuating lesions bilaterally (cyst vs. Early abscess) with significant bilateral perinephric stranding including around the proximal ureters without hydronephrosis. Follow-up CT scan on 06/15/2015 t contrast shows nearly complete resolution of perinephric edema/inflammation.  His Cr was also rising with peak of 2.3 following administration of IV contrast and vomiting. Renal function improved prior to discharge and has return to baseline.  He has been following with nephrology.  He continues to have fairly significant right squeezing lower back pain. He thinks that this is his kidney. He denies any voiding symptoms or gross hematuria.  He returns to the office today to follow up his CT scan (noncontrast given his history of contrast nephropathy) along with cystoscopy.    PMH: Past Medical History  Diagnosis Date  . Gout   . Gynecomastia, male left  . Eczema   . Hyperlipidemia   . Hypertension   . Snoring   . History of pyelonephritis Jan 2017    Surgical History: Past Surgical History  Procedure Laterality Date  . No past surgeries      Home Medications:    Medication List       This list is accurate as of: 06/16/15  8:07 PM.  Always use your most recent med list.               amLODipine 10 MG tablet  Commonly known as:  NORVASC  Take 1 tablet (10 mg total)  by mouth daily.     HYDROcodone-acetaminophen 5-325 MG tablet  Commonly known as:  NORCO/VICODIN  Take 1 tablet by mouth every 4 (four) hours as needed for moderate pain or severe pain.     losartan 50 MG tablet  Commonly known as:  COZAAR  Take 1 tablet (50 mg total) by mouth daily.        Allergies: No Known Allergies  Family History: Family History  Problem Relation Age of Onset  . Hypertension Mother   . Kidney disease Father   . Hypertension Father   . Dementia Father   . Squamous cell carcinoma Father   . Cancer Father     SCC  . Diabetes Father   . Heart disease Father   . Cancer Brother     hodgkin's lymphoma  . Heart disease Brother 68    3 vessel CABG  . Heart disease Maternal Grandmother   . Stroke Maternal Grandmother   . Heart disease Maternal Grandfather   . Stroke Maternal Grandfather   . Heart disease Paternal Grandmother   . Heart disease Paternal Grandfather     Social History:  reports that he has never smoked. He has quit using smokeless tobacco. His smokeless tobacco use included Chew. He reports that he does not drink alcohol or use illicit drugs.   Physical Exam: BP 163/91 mmHg  Pulse 66  Ht 6\' 2"  (1.88 m)  Wt 226  lb (102.513 kg)  BMI 29.00 kg/m2  Constitutional:  Alert and oriented, No acute distress. HEENT: [place] AT, moist mucus membranes.  Trachea midline, no masses. Cardiovascular: No clubbing, cyanosis, or edema. Respiratory: Normal respiratory effort, no increased work of breathing. GI: Abdomen is soft, nontender, nondistended, no abdominal masses GU: Right CVA tenderness. Normal circumcised phallus. Scrotum unremarkable bilaterally descended testicles. Skin: Macular slight erythematous rash noted on lateral aspect of knees, lower back, and suprapubic area. Neurologic: Grossly intact, no focal deficits, moving all 4 extremities. Psychiatric: Normal mood and affect.  Laboratory Data:  Lab Results  Component Value Date   WBC 6.2  05/13/2015   HGB 14.5 05/13/2015   HCT 43.6 05/13/2015   MCV 84.3 05/13/2015   PLT 208 05/13/2015    Lab Results  Component Value Date   CREATININE 1.43* 05/13/2015    Nephrology on 05/22/15 Cr 0.95.   Urinalysis Results for orders placed or performed in visit on 06/16/15  Microscopic Examination  Result Value Ref Range   WBC, UA 0-5 0 -  5 /hpf   RBC, UA 0-2 0 -  2 /hpf   Epithelial Cells (non renal) None seen 0 - 10 /hpf   Mucus, UA Present (A) Not Estab.   Bacteria, UA None seen None seen/Few  Urinalysis, Complete  Result Value Ref Range   Specific Gravity, UA >1.030 (H) 1.005 - 1.030   pH, UA 5.0 5.0 - 7.5   Color, UA Yellow Yellow   Appearance Ur Clear Clear   Leukocytes, UA Negative Negative   Protein, UA Negative Negative/Trace   Glucose, UA Negative Negative   Ketones, UA Negative Negative   RBC, UA Trace (A) Negative   Bilirubin, UA Negative Negative   Urobilinogen, Ur 0.2 0.2 - 1.0 mg/dL   Nitrite, UA Negative Negative   Microscopic Examination See below:     Pertinent Imaging: Study Result     CLINICAL DATA: Bilateral flank pain right side greater than left.  EXAM: CT ABDOMEN AND PELVIS WITHOUT CONTRAST  TECHNIQUE: Multidetector CT imaging of the abdomen and pelvis was performed following the standard protocol without IV contrast.  COMPARISON: 05/09/2015  FINDINGS: Lower chest: Unremarkable.  Hepatobiliary: 7 mm low-density lesion lateral segment left liver is unchanged. Another tiny low-density lesion towards the dome of liver is stable. There is no evidence for gallstones, gallbladder wall thickening, or pericholecystic fluid. No intrahepatic or extrahepatic biliary dilation.  Pancreas: No focal mass lesion. No dilatation of the main duct. No intraparenchymal cyst. No peripancreatic edema.  Spleen: No splenomegaly. No focal mass lesion.  Adrenals/Urinary Tract: No adrenal nodule or mass. Scattered bilateral tiny  calcifications are noted in the kidneys, some of which may be in the collecting system and others of which appear to be in the parenchyma. 9 mm low-density lesion upper pole right kidney cannot be further characterized. No hydronephrosis in either kidney. No evidence for hydroureter. Bladder is decompressed.  Stomach/Bowel: Stomach is nondistended. No gastric wall thickening. No evidence of outlet obstruction. Duodenum is normally positioned as is the ligament of Treitz. No small bowel wall thickening. No small bowel dilatation. The terminal ileum is normal. The appendix is normal. No gross colonic mass. No colonic wall thickening. No substantial diverticular change.  Vascular/Lymphatic: No abdominal aortic aneurysm. No abdominal atherosclerotic calcification. There is no gastrohepatic or hepatoduodenal ligament lymphadenopathy. No intraperitoneal or retroperitoneal lymphadenopathy. No pelvic sidewall lymphadenopathy.  Reproductive: Dystrophic calcification noted in the prostate gland.  Other: No intraperitoneal free fluid.  Musculoskeletal:  Bone windows reveal no worrisome lytic or sclerotic osseous lesions.  IMPRESSION: 1. No acute findings in the abdomen or pelvis. 2. The perinephric edema/inflammation seen on the previous study is largely resolved in the interval. There are multiple tiny calcifications in each kidney some of which appear to be in the collecting systems and others of which appear to be parenchymal. These are associated with scattered low and intermediate attenuation lesions bilaterally which cannot be further characterized. MRI of the kidneys without with contrast may be helpful to further evaluate.   Electronically Signed  By: Misty Stanley M.D.  On: 06/15/2015 08:23      Cystoscopy Procedure Note  Patient identification was confirmed, informed consent was obtained, and patient was prepped using Betadine solution.  Lidocaine jelly was  administered per urethral meatus.    Preoperative abx where received prior to procedure.     Pre-Procedure: - Inspection reveals a normal caliber ureteral meatus.  Procedure: The flexible cystoscope was introduced without difficulty - No urethral strictures/lesions are present. - Normal prostate  - Normal bladder neck - Bilateral ureteral orifices identified - Bladder mucosa  reveals no ulcers, tumors, or lesions - No bladder stones - No trabeculation  Retroflexion unremarkable.  Post-Procedure: - Patient tolerated the procedure well   Assessment & Plan:    1. Flank Pain-  This point, there is no indication that his right-sided back pain is related to his kidney. CT imaging reveals resolution of his perinephric inflammation. There is no other GU pathology identified on noncontrast CT scan. I suspect his pain is musculoskeletal. I have referred him back to his primary care, Dr. Sanda Klein for further evaluation.  2.  Microscopic hematuria-  Cysto negative.  CT scan reviewed as above.  No GU pathology.   3. Elevated Creatinine- Rechecked by Nephrology on 05/22/15 Cr 0.95.   4. Macular rash- Referral to dermatology placed today  Hollice Espy, MD  [facility] [address], [place], [place] [address] [phone]

## 2015-06-17 LAB — URINALYSIS, COMPLETE
Bilirubin, UA: NEGATIVE
Glucose, UA: NEGATIVE
Ketones, UA: NEGATIVE
LEUKOCYTES UA: NEGATIVE
NITRITE UA: NEGATIVE
PH UA: 5 (ref 5.0–7.5)
Protein, UA: NEGATIVE
Urobilinogen, Ur: 0.2 mg/dL (ref 0.2–1.0)

## 2015-06-17 LAB — MICROSCOPIC EXAMINATION
Bacteria, UA: NONE SEEN
EPITHELIAL CELLS (NON RENAL): NONE SEEN /HPF (ref 0–10)

## 2015-06-20 ENCOUNTER — Encounter: Payer: Self-pay | Admitting: Urology

## 2015-06-22 ENCOUNTER — Encounter: Payer: Self-pay | Admitting: Family Medicine

## 2015-06-22 ENCOUNTER — Ambulatory Visit (INDEPENDENT_AMBULATORY_CARE_PROVIDER_SITE_OTHER): Payer: BLUE CROSS/BLUE SHIELD | Admitting: Family Medicine

## 2015-06-22 VITALS — BP 150/93 | HR 59 | Temp 97.4°F | Wt 223.0 lb

## 2015-06-22 DIAGNOSIS — M545 Low back pain, unspecified: Secondary | ICD-10-CM

## 2015-06-22 DIAGNOSIS — I10 Essential (primary) hypertension: Secondary | ICD-10-CM | POA: Diagnosis not present

## 2015-06-22 DIAGNOSIS — G8929 Other chronic pain: Secondary | ICD-10-CM | POA: Diagnosis not present

## 2015-06-22 DIAGNOSIS — L309 Dermatitis, unspecified: Secondary | ICD-10-CM

## 2015-06-22 MED ORDER — HYDROCODONE-ACETAMINOPHEN 5-325 MG PO TABS: 1.0000 | ORAL_TABLET | ORAL | Status: DC | PRN

## 2015-06-22 MED ORDER — LOSARTAN POTASSIUM 100 MG PO TABS: 100.0000 mg | ORAL_TABLET | Freq: Every day | ORAL | Status: DC

## 2015-06-22 NOTE — Assessment & Plan Note (Signed)
Going to dermatologist at [facility] in the next month or so to see if psoriasis which could cause arthritis

## 2015-06-22 NOTE — Progress Notes (Signed)
BP 150/93 mmHg  Pulse 59  Temp(Src) 97.4 F (36.3 C)  Wt 223 lb (101.152 kg)  SpO2 99%   Subjective:    Patient ID: David [name], male    DOB: Oct 18, 1970, 45 y.o.   MRN: [id number]  HPI: David [name] is a 45 y.o. male  Chief Complaint  Patient presents with  . Hypertension   He is here with his wife Had CT scan done on the 27th; the inflammation was gone and he is still having pain in the right side back;  He underwent cystoscopy; not sore to the touch; nothing makes it worse; sometimes wakes him up in night When he has the pain, he takes pain pills but doesn't like taking pill  Taking BP pills as directed; trying to limit salt; no fast food; has no appetite; this is a man who went from eating lots to not eating much at all; he is in daily pain; does not come and go; never leaves; if he stays in the chair too long, if he sits too long, it bothers him  Still having back pain; reviewed scan from May 2016; he does not want to go back to that doctor IMPRESSION: 1. Minimal subarticular and foraminal narrowing bilaterally at L4-5 is slightly worse on the left. 2. Mild facet hypertrophy throughout the remainder of the lumbar spine without other focal stenosis. 3. Far left lateral disc protrusion at L2-3 without significant stenosis. Electronically Signed  By: San Morelle M.D.  On: 08/18/2014 08:07  Saw orthopaedist last year Has not done PT and has not been to a pain clinic  Relevant past medical, surgical, family and social history reviewed and updated as indicated. Interim medical history since our last visit reviewed. Allergies and medications reviewed and updated.  Review of Systems  Per HPI unless specifically indicated above     Objective:    BP 150/93 mmHg  Pulse 59  Temp(Src) 97.4 F (36.3 C)  Wt 223 lb (101.152 kg)  SpO2 99%  Wt Readings from Last 3 Encounters:  06/30/15 222 lb (100.699 kg)  06/22/15 223 lb (101.152 kg)  06/16/15 226 lb  (102.513 kg)   body mass index is 28.62 kg/(m^2).   Physical Exam  Constitutional: He appears well-developed and well-nourished. No distress.  overweight  Eyes: No scleral icterus.  Cardiovascular: Normal rate and regular rhythm.   Pulmonary/Chest: Effort normal and breath sounds normal.  Abdominal: Soft.  Musculoskeletal:       Right shoulder: He exhibits decreased range of motion and tenderness. He exhibits no deformity and no spasm.  Pain with rotation and then extension at the waist  Neurological: He is alert. He exhibits normal muscle tone. Coordination normal.  Skin: Skin is warm. He is not diaphoretic. No pallor.  Psychiatric: He has a normal mood and affect.   Results for orders placed or performed in visit on 06/16/15  Microscopic Examination  Result Value Ref Range   WBC, UA 0-5 0 -  5 /hpf   RBC, UA 0-2 0 -  2 /hpf   Epithelial Cells (non renal) None seen 0 - 10 /hpf   Mucus, UA Present (A) Not Estab.   Bacteria, UA None seen None seen/Few  Urinalysis, Complete  Result Value Ref Range   Specific Gravity, UA >1.030 (H) 1.005 - 1.030   pH, UA 5.0 5.0 - 7.5   Color, UA Yellow Yellow   Appearance Ur Clear Clear   Leukocytes, UA Negative Negative   Protein,  UA Negative Negative/Trace   Glucose, UA Negative Negative   Ketones, UA Negative Negative   RBC, UA Trace (A) Negative   Bilirubin, UA Negative Negative   Urobilinogen, Ur 0.2 0.2 - 1.0 mg/dL   Nitrite, UA Negative Negative   Microscopic Examination See below:       Assessment & Plan:   Problem List Items Addressed This Visit      Cardiovascular and Mediastinum   Hypertension - Primary    Not ideal; increase ARB to 100 mg daily; continue CCB; recheck BMP in 2 weeks      Relevant Medications   losartan (COZAAR) 100 MG tablet     Musculoskeletal and Integument   Eczema    Going to dermatologist at [facility] in the next month or so to see if psoriasis which could cause arthritis         Other   Chronic lower back pain    Seen by ortho last year; MRI reviewed; patient's pain not controlled; will avoid NSAIDs given recent kidney insult; hydrocodone prescribed; I do not suspect any drug-seeking behavior at all; refer for PT and to pain clinic for pain management options such as TENS unit, injections, etc.      Relevant Medications   HYDROcodone-acetaminophen (NORCO/VICODIN) 5-325 MG tablet   Other Relevant Orders   Ambulatory referral to Physical Therapy   Ambulatory referral to Pain Clinic       Follow up plan: Return in about 2 weeks (around 07/06/2015).  PT and pain clinic  Orders Placed This Encounter  Procedures  . Ambulatory referral to Physical Therapy  . Ambulatory referral to Pain Clinic   Meds ordered this encounter  Medications  . losartan (COZAAR) 100 MG tablet    Sig: Take 1 tablet (100 mg total) by mouth daily.    Dispense:  30 tablet    Refill:  1    Increasing dose  . HYDROcodone-acetaminophen (NORCO/VICODIN) 5-325 MG tablet    Sig: Take 1 tablet by mouth every 4 (four) hours as needed for moderate pain or severe pain.    Dispense:  20 tablet    Refill:  0   An after-visit summary was printed and given to the patient at [facility].  Please see the patient instructions which may contain other information and recommendations beyond what is mentioned above in the assessment and plan.

## 2015-06-22 NOTE — Patient Instructions (Addendum)
Request labs from Dr. Matt Holmes refer you to the pain clinic and to physical therapy Increase losartan and return in 2 weeks for BP recheck and BMP

## 2015-06-22 NOTE — Assessment & Plan Note (Addendum)
Not ideal; increase ARB to 100 mg daily; continue CCB; recheck BMP in 2 weeks

## 2015-06-29 ENCOUNTER — Telehealth: Payer: Self-pay | Admitting: Family Medicine

## 2015-06-29 NOTE — Telephone Encounter (Signed)
Routing to provider for advice.

## 2015-06-29 NOTE — Telephone Encounter (Signed)
I talked to patient No body aches; no cough; just sinus mess Plain mucinex, NO decongestants Nasal saline rinse or spray or neti pet Plain antihistamine like claritin or zyrtec Ask pharmacist if safe with high blood pressure Rest and hydration and vitamin C

## 2015-06-29 NOTE — Telephone Encounter (Signed)
Pt has a cough and is really congested and would like to know what he can take that wouldn't affect any of his other medications. If something needs to be called in instead of taking something over the counter he would like it to go to [facility].

## 2015-06-30 ENCOUNTER — Encounter: Payer: Self-pay | Admitting: Family Medicine

## 2015-06-30 ENCOUNTER — Ambulatory Visit (INDEPENDENT_AMBULATORY_CARE_PROVIDER_SITE_OTHER): Payer: BLUE CROSS/BLUE SHIELD | Admitting: Family Medicine

## 2015-06-30 VITALS — BP 150/97 | HR 82 | Temp 98.9°F | Ht 72.3 in | Wt 222.0 lb

## 2015-06-30 DIAGNOSIS — I10 Essential (primary) hypertension: Secondary | ICD-10-CM

## 2015-06-30 DIAGNOSIS — J069 Acute upper respiratory infection, unspecified: Secondary | ICD-10-CM | POA: Diagnosis not present

## 2015-06-30 LAB — INFLUENZA A AND B
INFLUENZA B AG, EIA: NEGATIVE
Influenza A Ag, EIA: NEGATIVE

## 2015-06-30 LAB — PLEASE NOTE:

## 2015-06-30 NOTE — Assessment & Plan Note (Signed)
Pressure elevated today but patient's sick with coughing during blood pressure checks will observe blood pressure not coming down we will need sooner office visit

## 2015-06-30 NOTE — Progress Notes (Signed)
BP 150/97 mmHg  Pulse 82  Temp(Src) 98.9 F (37.2 C)  Ht 6' 0.3" (1.836 m)  Wt 222 lb (100.699 kg)  BMI 29.87 kg/m2  SpO2 98%   Subjective:    Patient ID: David [name], male    DOB: October 09, 1970, 45 y.o.   MRN: [id number]  HPI: David [name] is a 45 y.o. male  Chief Complaint  Patient presents with  . URI    x2 days   patient with sudden onset of flulike symptoms generalized body aches and fever or chills dry cough. Patient's tried some over-the-counter medications Tylenol with only minimal relief patient now coughing and sniffling.  Multiple systemic symptoms Relevant past medical, surgical, family and social history reviewed and updated as indicated. Interim medical history since our last visit reviewed. Allergies and medications reviewed and updated.  Review of Systems  Constitutional: Positive for fever and fatigue.  HENT: Positive for postnasal drip, rhinorrhea, sneezing and sore throat.   Respiratory: Positive for cough.   Cardiovascular: Negative.     Per HPI unless specifically indicated above     Objective:    BP 150/97 mmHg  Pulse 82  Temp(Src) 98.9 F (37.2 C)  Ht 6' 0.3" (1.836 m)  Wt 222 lb (100.699 kg)  BMI 29.87 kg/m2  SpO2 98%  Wt Readings from Last 3 Encounters:  06/30/15 222 lb (100.699 kg)  06/22/15 223 lb (101.152 kg)  06/16/15 226 lb (102.513 kg)    Physical Exam  Constitutional: He is oriented to person, place, and time. He appears well-developed and well-nourished. No distress.  HENT:  Head: Normocephalic and atraumatic.  Right Ear: Hearing and external ear normal.  Left Ear: Hearing and external ear normal.  Nose: Nose normal.  Mouth/Throat: Oropharyngeal exudate present.  Eyes: Conjunctivae and lids are normal. Right eye exhibits no discharge. Left eye exhibits no discharge. No scleral icterus.  Pulmonary/Chest: Effort normal. No respiratory distress. He has no wheezes. He has no rales. He exhibits no tenderness.  Musculoskeletal:  Normal range of motion.  Neurological: He is alert and oriented to person, place, and time.  Skin: Skin is intact. No rash noted.  Psychiatric: He has a normal mood and affect. His speech is normal and behavior is normal. Judgment and thought content normal. Cognition and memory are normal.    Results for orders placed or performed in visit on 06/16/15  Microscopic Examination  Result Value Ref Range   WBC, UA 0-5 0 -  5 /hpf   RBC, UA 0-2 0 -  2 /hpf   Epithelial Cells (non renal) None seen 0 - 10 /hpf   Mucus, UA Present (A) Not Estab.   Bacteria, UA None seen None seen/Few  Urinalysis, Complete  Result Value Ref Range   Specific Gravity, UA >1.030 (H) 1.005 - 1.030   pH, UA 5.0 5.0 - 7.5   Color, UA Yellow Yellow   Appearance Ur Clear Clear   Leukocytes, UA Negative Negative   Protein, UA Negative Negative/Trace   Glucose, UA Negative Negative   Ketones, UA Negative Negative   RBC, UA Trace (A) Negative   Bilirubin, UA Negative Negative   Urobilinogen, Ur 0.2 0.2 - 1.0 mg/dL   Nitrite, UA Negative Negative   Microscopic Examination See below:       Assessment & Plan:   Problem List Items Addressed This Visit    None    Visit Diagnoses    Upper respiratory infection    -  Primary  Flu test negative discuss URI care and treatment use of over-the-counter medications Tylenol Mucinex nasal rinse precaution against spread proper precaution tec    Relevant Orders    Influenza a and b        Follow up plan: Return for As scheduled.

## 2015-06-30 NOTE — Addendum Note (Signed)
Addended byGolden Pop on: 06/30/2015 04:17 PM   Modules accepted: Miquel Dunn

## 2015-07-01 NOTE — Assessment & Plan Note (Signed)
Seen by ortho last year; MRI reviewed; patient's pain not controlled; will avoid NSAIDs given recent kidney insult; hydrocodone prescribed; I do not suspect any drug-seeking behavior at all; refer for PT and to pain clinic for pain management options such as TENS unit, injections, etc.

## 2015-07-06 ENCOUNTER — Ambulatory Visit: Payer: BLUE CROSS/BLUE SHIELD | Admitting: Family Medicine

## 2015-07-14 ENCOUNTER — Ambulatory Visit: Payer: Self-pay | Admitting: Family Medicine

## 2015-07-15 ENCOUNTER — Encounter: Payer: Self-pay | Admitting: Family Medicine

## 2015-09-03 ENCOUNTER — Ambulatory Visit: Payer: BLUE CROSS/BLUE SHIELD | Admitting: Anesthesiology

## 2015-09-10 ENCOUNTER — Other Ambulatory Visit: Payer: Self-pay | Admitting: Family Medicine

## 2015-09-10 NOTE — Telephone Encounter (Signed)
I'm going to ask patient to request this medicine now from his kidney doctor, Dr. Juleen China; patient was supposed to come back for recheck and labs to recheck kidney on this and I did not get those results This needs to be followed closely; please ask him to get this medicine from now on from the kidney doctor; thank you

## 2015-09-11 NOTE — Telephone Encounter (Signed)
Please see note below; I have sent a 7 day supply, but patient really needs to make sure his kidneys are working and happy on this new higher dose; I'm not trying to be punitive; I'm being careful and looking out for his kidneys; I hope he understands

## 2015-09-15 NOTE — Telephone Encounter (Signed)
Wife notified.

## 2017-01-28 IMAGING — CT CT ABD-PELV W/ CM
1 of 3 series · 14 of 32 positions shown, 19 images · IV contrast (omnipaque)
Comparison: None.

CLINICAL DATA: Right upper quadrant right flank pain for 2 days
with nausea

EXAM:
CT ABDOMEN AND PELVIS WITH CONTRAST
TECHNIQUE: Multidetector CT imaging of the abdomen and pelvis was performed
using the standard protocol following bolus administration of
intravenous contrast.
CONTRAST:  125mL OMNIPAQUE IOHEXOL 300 MG/ML  SOLN

[Series 2: routine abd pel with · axial · 0.73mm/px · z∈[-610,-145]mm · 14 of 105 slices shown, 19 images]
[im 6/105  soft-tissue]
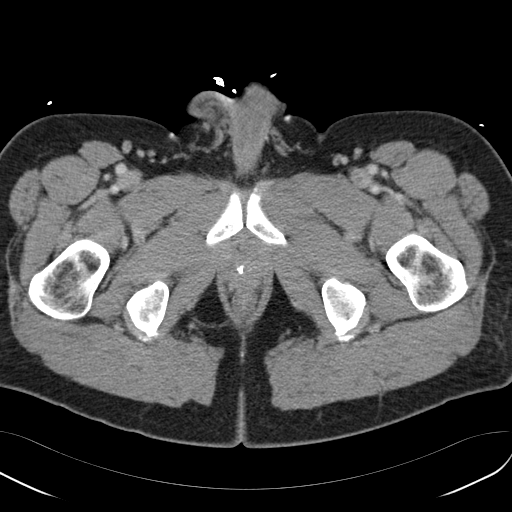
[im 6/105  bone]
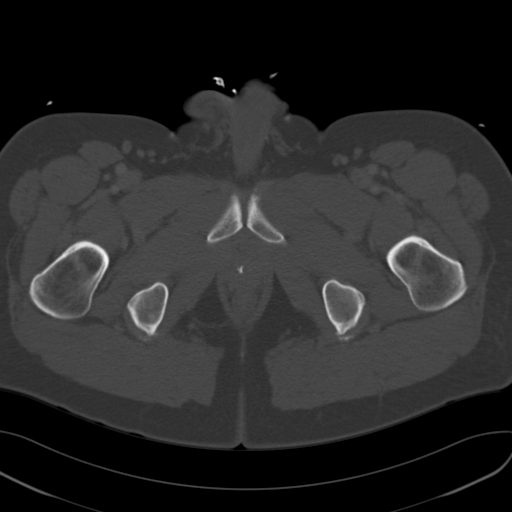
[im 17/105  soft-tissue]
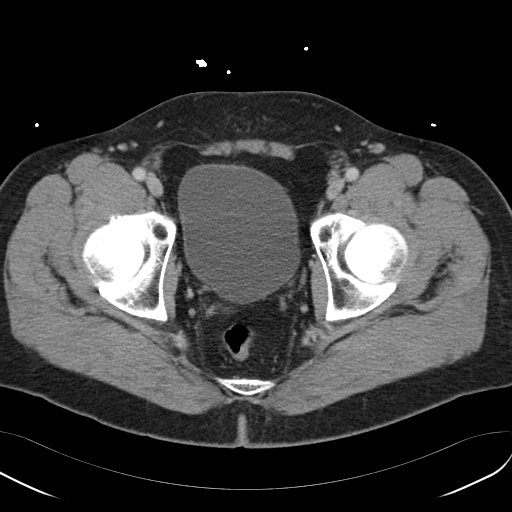
[im 22/105  soft-tissue]
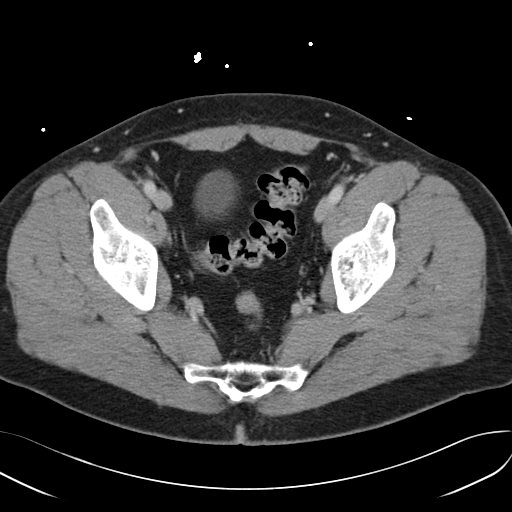
[im 28/105  soft-tissue]
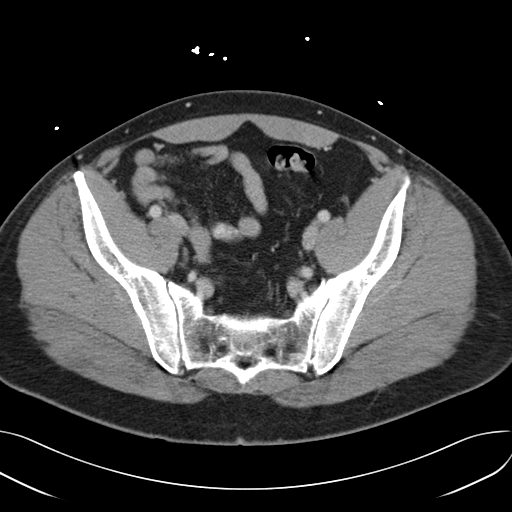
[im 39/105  soft-tissue]
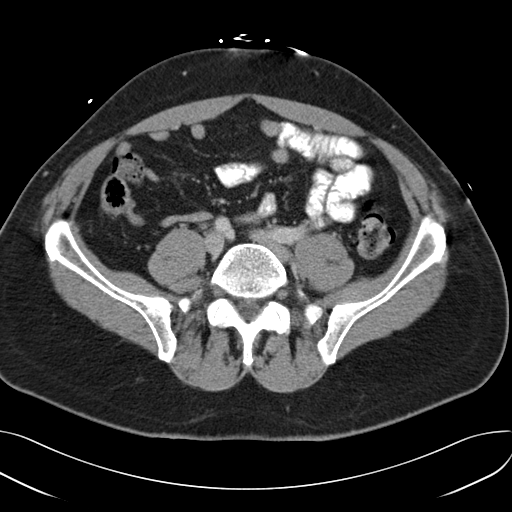
[im 44/105  soft-tissue]
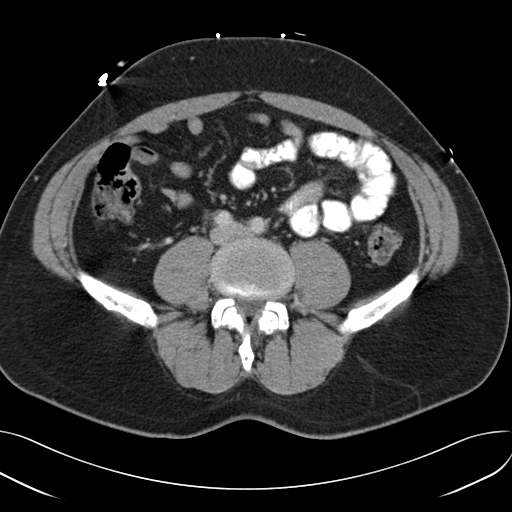
[im 55/105  soft-tissue]
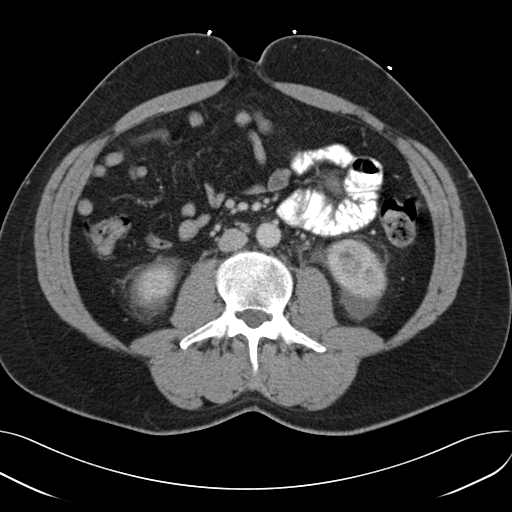
[im 61/105  soft-tissue]
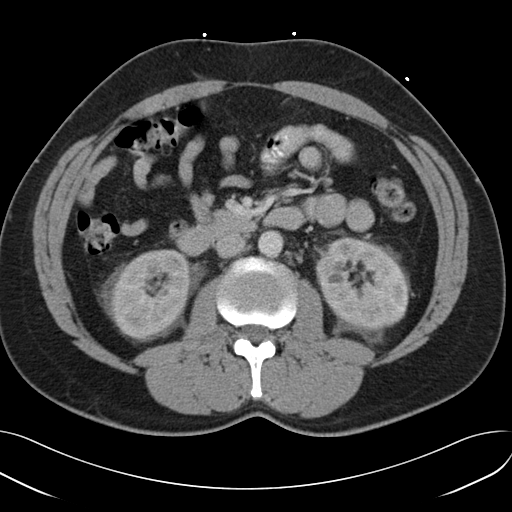
[im 66/105  soft-tissue]
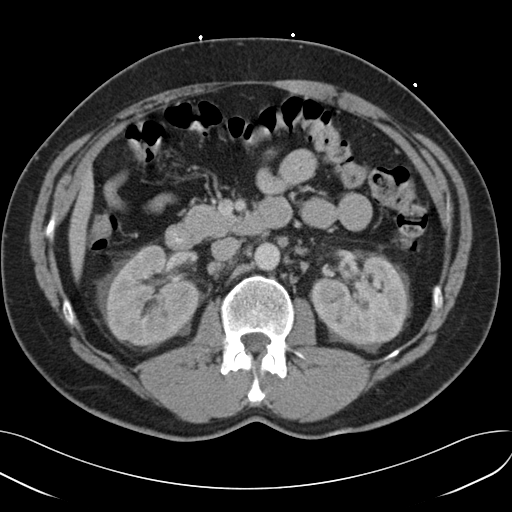
[im 66/105  bone]
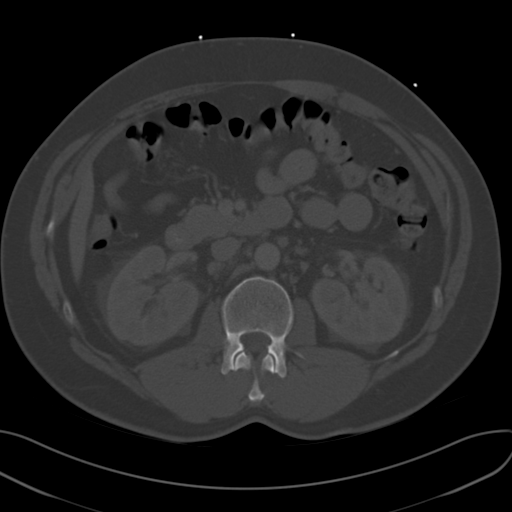
[im 77/105  soft-tissue]
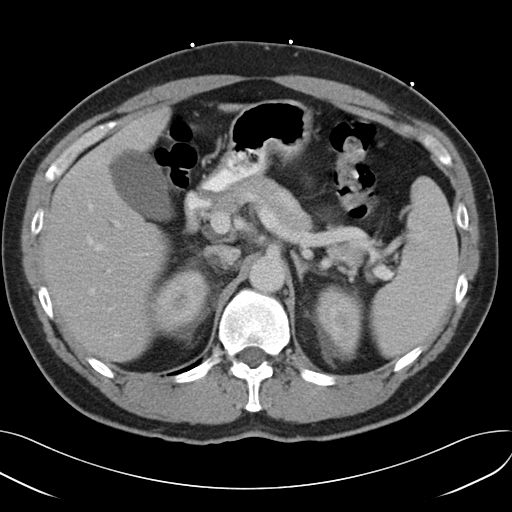
[im 83/105  soft-tissue]
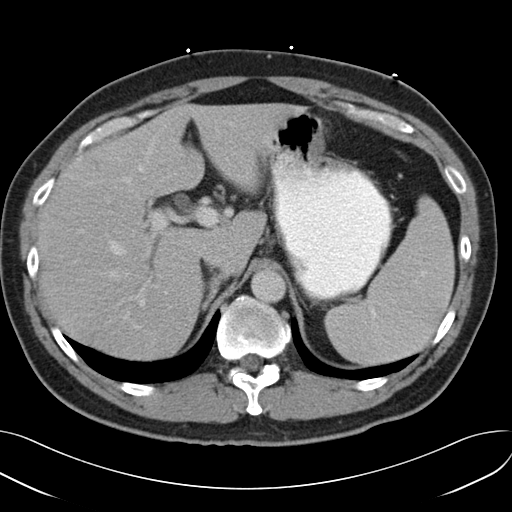
[im 83/105  lung]
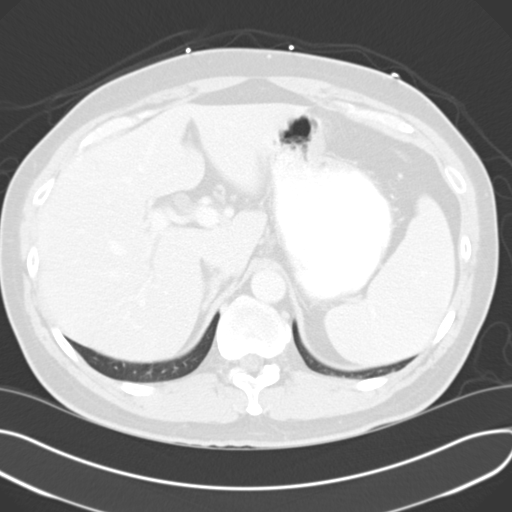
[im 88/105  soft-tissue]
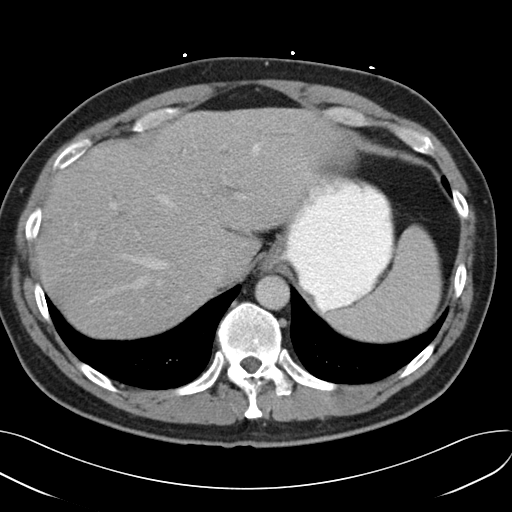
[im 88/105  lung]
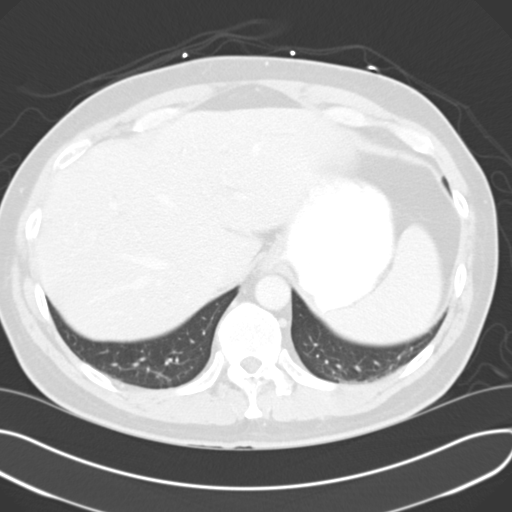
[im 94/105  lung]
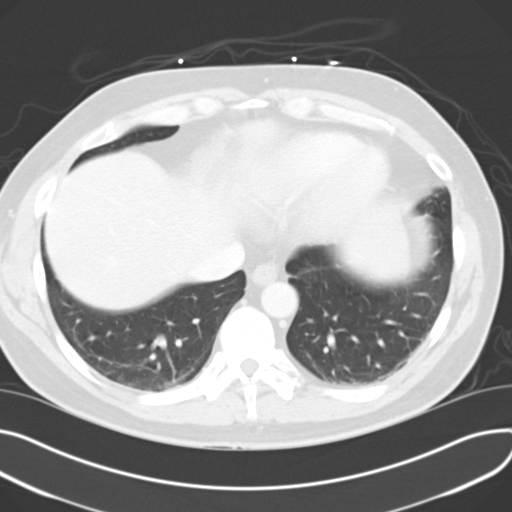
[im 99/105  soft-tissue]
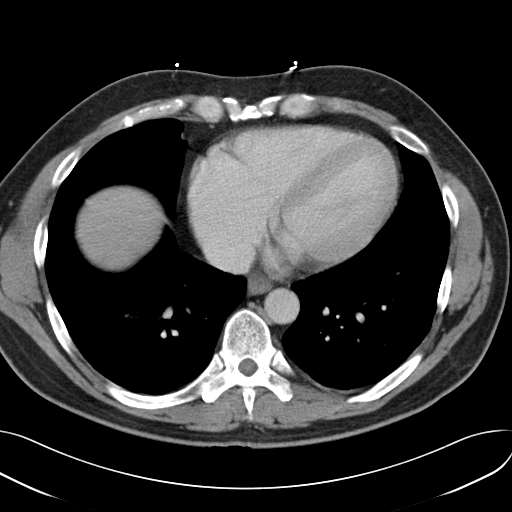
[im 99/105  lung]
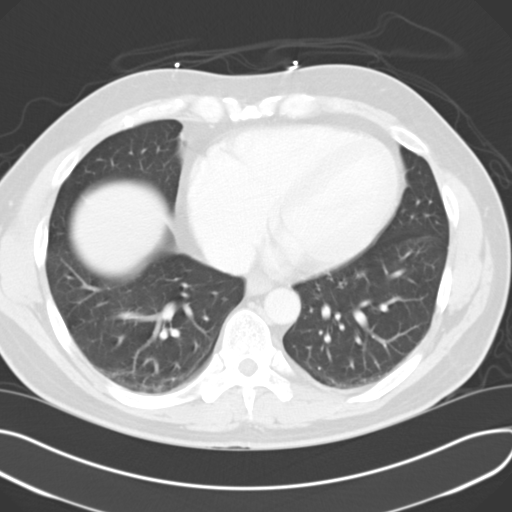

[14 of 32 positions shown; findings below may reference images not displayed]

FINDINGS: Lower chest:  Negative

Hepatobiliary: 5 mm low-attenuation lesion image number 25 lateral
left lobe of liver too small to characterize possibly a cyst.

Pancreas: Negative

Spleen: Negative

Adrenals/Urinary Tract: Normal adrenal glands. Severe bilateral
perinephric inflammatory change. Several bilateral low-attenuation
lesions measure between 5 and 15 mm. The largest is in the right
upper pole with average attenuation value of 16. Inflammatory change
tracks along the proximal ureters. Bladder is normal the there is no
inflammation around the bladder.

Stomach/Bowel: Normal

Vascular/Lymphatic: No significant vascular abnormalities. There is
a celiac axis lymph node measuring 7 mm. There are numerous
nonpathologic retroperitoneal periaortic and aortocaval lymph nodes
conspicuous only by number.

Reproductive: No acute finding

Other: No free fluid

Musculoskeletal: No acute findings
IMPRESSION: There is severe bilateral symmetric perinephric inflammatory change.
There are numerous bilateral low-attenuation renal lesions, the
majority of which are 5 mm or smaller, likely cysts. The kidneys are
normal in size at about 12cm. The bilaterality suggests a systemic
process causing bilateral renal inflammation; bilateral infectious
pyelonephritis is possible but less likely, and there is no evidence
of cystitis.

## 2017-04-27 ENCOUNTER — Ambulatory Visit (INDEPENDENT_AMBULATORY_CARE_PROVIDER_SITE_OTHER): Payer: BLUE CROSS/BLUE SHIELD | Admitting: Family Medicine

## 2017-04-27 ENCOUNTER — Encounter: Payer: Self-pay | Admitting: Family Medicine

## 2017-04-27 VITALS — BP 156/102 | HR 72 | Temp 98.6°F | Resp 14 | Wt 220.1 lb

## 2017-04-27 DIAGNOSIS — I10 Essential (primary) hypertension: Secondary | ICD-10-CM | POA: Diagnosis not present

## 2017-04-27 DIAGNOSIS — M545 Low back pain, unspecified: Secondary | ICD-10-CM

## 2017-04-27 LAB — COMPREHENSIVE METABOLIC PANEL
AG Ratio: 1.6 (calc) (ref 1.0–2.5)
ALBUMIN MSPROF: 4.6 g/dL (ref 3.6–5.1)
ALT: 11 U/L (ref 9–46)
AST: 17 U/L (ref 10–40)
Alkaline phosphatase (APISO): 84 U/L (ref 40–115)
BUN: 11 mg/dL (ref 7–25)
CO2: 30 mmol/L (ref 20–32)
Calcium: 9.4 mg/dL (ref 8.6–10.3)
Chloride: 106 mmol/L (ref 98–110)
Creat: 0.87 mg/dL (ref 0.60–1.35)
GLUCOSE: 92 mg/dL (ref 65–99)
Globulin: 2.9 g/dL (calc) (ref 1.9–3.7)
POTASSIUM: 3.7 mmol/L (ref 3.5–5.3)
SODIUM: 142 mmol/L (ref 135–146)
TOTAL PROTEIN: 7.5 g/dL (ref 6.1–8.1)
Total Bilirubin: 0.5 mg/dL (ref 0.2–1.2)

## 2017-04-27 LAB — CBC WITH DIFFERENTIAL/PLATELET
BASOS ABS: 60 {cells}/uL (ref 0–200)
Basophils Relative: 0.7 %
EOS PCT: 2.2 %
Eosinophils Absolute: 189 cells/uL (ref 15–500)
HCT: 43.4 % (ref 38.5–50.0)
HEMOGLOBIN: 15.1 g/dL (ref 13.2–17.1)
Lymphs Abs: 2442 cells/uL (ref 850–3900)
MCH: 28.6 pg (ref 27.0–33.0)
MCHC: 34.8 g/dL (ref 32.0–36.0)
MCV: 82.2 fL (ref 80.0–100.0)
MPV: 11.1 fL (ref 7.5–12.5)
Monocytes Relative: 10.8 %
NEUTROS ABS: 4979 {cells}/uL (ref 1500–7800)
NEUTROS PCT: 57.9 %
Platelets: 234 10*3/uL (ref 140–400)
RBC: 5.28 10*6/uL (ref 4.20–5.80)
RDW: 12.7 % (ref 11.0–15.0)
Total Lymphocyte: 28.4 %
WBC mixed population: 929 cells/uL (ref 200–950)
WBC: 8.6 10*3/uL (ref 3.8–10.8)

## 2017-04-27 LAB — POCT URINALYSIS DIPSTICK
BILIRUBIN UA: NEGATIVE
GLUCOSE UA: NEGATIVE
Ketones, UA: NEGATIVE
LEUKOCYTES UA: NEGATIVE
Nitrite, UA: NEGATIVE
PH UA: 5 (ref 5.0–8.0)
Protein, UA: NEGATIVE
RBC UA: NEGATIVE
Spec Grav, UA: 1.015 (ref 1.010–1.025)
UROBILINOGEN UA: 0.2 U/dL

## 2017-04-27 MED ORDER — AMLODIPINE BESYLATE 10 MG PO TABS: 10.0000 mg | ORAL_TABLET | Freq: Every day | ORAL | 11 refills | Status: DC

## 2017-04-27 MED ORDER — HYDROCODONE-ACETAMINOPHEN 5-325 MG PO TABS: 1.0000 | ORAL_TABLET | ORAL | 0 refills | Status: DC | PRN

## 2017-04-27 NOTE — Progress Notes (Signed)
BP (!) 156/102   Pulse 72   Temp 98.6 F (37 C) (Oral)   Resp 14   Wt 220 lb 1.6 oz (99.8 kg)   SpO2 99%   BMI 29.60 kg/m    Subjective:    Patient ID: David [name], male    DOB: 1970-12-08, 47 y.o.   MRN: [id number]  HPI: David [name] is a 47 y.o. male  Chief Complaint  Patient presents with  . Back Pain    low back  . Medication Refill    bp meds has been out    HPI  Patient is here for an acute visit; has not been seen in almost two years Back pain; it is the same as when he was hospitalized with the kidney infection or inflammation or whatever it was last year; they never really figured out what was wrong he says; he was hospitalized for about five days, saw hospitalist plus nephrologist plus urologist; he has had intermittent episodes since then This episode started for a month, but really hit him hard the other day; nowhere near that bad today No blood in the urine No fevers No blood in the stool Having night sweats Don't think any weight loss New rash that started showing me a mildly erythematous nummular rash on the arms No foam with urine, just a few bubbles he says No leg edema No changes in environment, no travel, no sick contacts Pain level is a 4 out of 10 now; was really bad a few days ago They never figured out what it was he says again He refuses to go to the ER; refuses to get CT scan today; says he is still paying off the last hospital stay and testing; he says that's why he hasn't been back in to see me, the cost  Reviewed the CT scan from 2017; followed up with urologist after hospital and was told every thing was okay He saw Dr. Erlene Quan, had cystoscopy, says he never wants to do that again  CT scan May 09, 2015 IMPRESSION: There is severe bilateral symmetric perinephric inflammatory change. There are numerous bilateral low-attenuation renal lesions, the majority of which are 5 mm or smaller, likely cysts. The kidneys are normal in size at  about 12cm. The bilaterality suggests a systemic process causing bilateral renal inflammation; bilateral infectious pyelonephritis is possible but less likely, and there is no evidence of cystitis.   Electronically Signed   By: Skipper Cliche M.D.   On: 05/09/2015 19:46  Hypertension; he has been out of medicine for a while; willing to start back  Depression screen PHQ 2/9 04/27/2017  Decreased Interest 0  Down, Depressed, Hopeless 0  PHQ - 2 Score 0    Relevant past medical, surgical, family and social history reviewed Past Medical History:  Diagnosis Date  . Eczema   . Gout   . Gynecomastia, male left  . History of pyelonephritis Jan 2017  . Hyperlipidemia   . Hypertension   . Snoring    Past Surgical History:  Procedure Laterality Date  . NO PAST SURGERIES     Family History  Problem Relation Age of Onset  . Hypertension Mother   . Kidney disease Father   . Hypertension Father   . Dementia Father   . Squamous cell carcinoma Father   . Cancer Father        SCC  . Diabetes Father   . Heart disease Father   . Cancer Brother  hodgkin's lymphoma  . Heart disease Brother 43       3 vessel CABG  . Heart disease Maternal Grandmother   . Stroke Maternal Grandmother   . Heart disease Maternal Grandfather   . Stroke Maternal Grandfather   . Heart disease Paternal Grandmother   . Heart disease Paternal Grandfather    Social History   Tobacco Use  . Smoking status: Never Smoker  . Smokeless tobacco: Former Systems developer    Types: Chew  Substance Use Topics  . Alcohol use: No  . Drug use: No    Interim medical history since last visit reviewed. Allergies and medications reviewed  Review of Systems Per HPI unless specifically indicated above     Objective:    BP (!) 156/102   Pulse 72   Temp 98.6 F (37 C) (Oral)   Resp 14   Wt 220 lb 1.6 oz (99.8 kg)   SpO2 99%   BMI 29.60 kg/m   Wt Readings from Last 3 Encounters:  04/27/17 220 lb 1.6 oz (99.8  kg)  06/30/15 222 lb (100.7 kg)  06/22/15 223 lb (101.2 kg)    Physical Exam  Constitutional: He appears well-developed and well-nourished. No distress.  overweight  Eyes: No scleral icterus.  Cardiovascular: Normal rate and regular rhythm.  Pulmonary/Chest: Effort normal and breath sounds normal.  Abdominal: Soft. There is CVA tenderness (mild right more than left).  Musculoskeletal:       Right shoulder: He exhibits no deformity and no spasm.       Thoracic back: He exhibits no edema and no deformity.       Lumbar back: He exhibits no edema and no deformity.  Pain with rotation and then extension at the waist  Neurological: He is alert. He exhibits normal muscle tone. Coordination normal.  Skin: Skin is warm. Rash (nummular erythematous rash on the arms) noted. He is not diaphoretic. No pallor.  Psychiatric: He has a normal mood and affect. His mood appears not anxious. He does not exhibit a depressed mood.      Assessment & Plan:   Problem List Items Addressed This Visit      Cardiovascular and Mediastinum   Hypertension    Start back on CCB; will add back another agent once I see his creatinine; referring to nephrologist; DASH guidelines      Relevant Medications   amLODipine (NORVASC) 10 MG tablet    Other Visit Diagnoses    Acute midline low back pain without sciatica    -  Primary   same as previous episode he reports when hospitalized, no cause for inflammation found; refer back to nephrologist; may need biopsy   Relevant Medications   HYDROcodone-acetaminophen (NORCO/VICODIN) 5-325 MG tablet   Other Relevant Orders   POCT Urinalysis Dipstick (Completed)   Comprehensive Metabolic Panel (CMET) (Completed)   CBC with Differential/Platelet (Completed)   Ambulatory referral to Nephrology       Follow up plan: Return in about 2 weeks (around 05/11/2017) for follow-up visit with Dr. Sanda Klein.  An after-visit summary was printed and given to the patient at [place].   Please see the patient instructions which may contain other information and recommendations beyond what is mentioned above in the assessment and plan.  Meds ordered this encounter  Medications  . amLODipine (NORVASC) 10 MG tablet    Sig: Take 1 tablet (10 mg total) by mouth daily.    Dispense:  30 tablet    Refill:  11  .  HYDROcodone-acetaminophen (NORCO/VICODIN) 5-325 MG tablet    Sig: Take 1 tablet by mouth every 4 (four) hours as needed for moderate pain or severe pain.    Dispense:  20 tablet    Refill:  0    Orders Placed This Encounter  Procedures  . Comprehensive Metabolic Panel (CMET)  . CBC with Differential/Platelet  . Ambulatory referral to Nephrology  . POCT Urinalysis Dipstick   NCCSRS web site reviewed x 2 years prior to prescribing narcotics per STOP ACT rule; no red flags; Rx given

## 2017-04-27 NOTE — Patient Instructions (Addendum)
Please go to the emergency department if you get worse We'll contact you at [phone] with abnormal labs so keep your phone handy Hydrate Avoid nonsteroidals for now Tylenol for mild to moderate pain, or the hydrocodone/apap for moderate to severe pain DASH Eating Plan DASH stands for "Dietary Approaches to Stop Hypertension." The DASH eating plan is a healthy eating plan that has been shown to reduce high blood pressure (hypertension). It may also reduce your risk for type 2 diabetes, heart disease, and stroke. The DASH eating plan may also help with weight loss. What are tips for following this plan? General guidelines  Avoid eating more than 2,300 mg (milligrams) of salt (sodium) a day. If you have hypertension, you may need to reduce your sodium intake to 1,500 mg a day.  Limit alcohol intake to no more than 1 drink a day for nonpregnant women and 2 drinks a day for men. One drink equals 12 oz of beer, 5 oz of wine, or 1 oz of hard liquor.  Work with your health care provider to maintain a healthy body weight or to lose weight. Ask what an ideal weight is for you.  Get at least 30 minutes of exercise that causes your heart to beat faster (aerobic exercise) most days of the week. Activities may include walking, swimming, or biking.  Work with your health care provider or diet and nutrition specialist (dietitian) to adjust your eating plan to your individual calorie needs. Reading food labels  Check food labels for the amount of sodium per serving. Choose foods with less than 5 percent of the Daily Value of sodium. Generally, foods with less than 300 mg of sodium per serving fit into this eating plan.  To find whole grains, look for the word "whole" as the first word in the ingredient list. Shopping  Buy products labeled as "low-sodium" or "no salt added."  Buy fresh foods. Avoid canned foods and premade or frozen meals. Cooking  Avoid adding salt when cooking. Use salt-free  seasonings or herbs instead of table salt or sea salt. Check with your health care provider or pharmacist before using salt substitutes.  Do not fry foods. Cook foods using healthy methods such as baking, boiling, grilling, and broiling instead.  Cook with heart-healthy oils, such as olive, canola, soybean, or sunflower oil. Meal planning   Eat a balanced diet that includes: ? 5 or more servings of fruits and vegetables each day. At each meal, try to fill half of your plate with fruits and vegetables. ? Up to 6-8 servings of whole grains each day. ? Less than 6 oz of lean meat, poultry, or fish each day. A 3-oz serving of meat is about the same size as a deck of cards. One egg equals 1 oz. ? 2 servings of low-fat dairy each day. ? A serving of nuts, seeds, or beans 5 times each week. ? Heart-healthy fats. Healthy fats called Omega-3 fatty acids are found in foods such as flaxseeds and coldwater fish, like sardines, salmon, and mackerel.  Limit how much you eat of the following: ? Canned or prepackaged foods. ? Food that is high in trans fat, such as fried foods. ? Food that is high in saturated fat, such as fatty meat. ? Sweets, desserts, sugary drinks, and other foods with added sugar. ? Full-fat dairy products.  Do not salt foods before eating.  Try to eat at least 2 vegetarian meals each week.  Eat more home-cooked food and less restaurant, buffet,  and fast food.  When eating at a restaurant, ask that your food be prepared with less salt or no salt, if possible. What foods are recommended? The items listed may not be a complete list. Talk with your dietitian about what dietary choices are best for you. Grains Whole-grain or whole-wheat bread. Whole-grain or whole-wheat pasta. Brown rice. [place]. Bulgur. Whole-grain and low-sodium cereals. Pita bread. Low-fat, low-sodium crackers. Whole-wheat flour tortillas. Vegetables Fresh or frozen vegetables (raw, steamed, roasted,  or grilled). Low-sodium or reduced-sodium tomato and vegetable juice. Low-sodium or reduced-sodium tomato sauce and tomato paste. Low-sodium or reduced-sodium canned vegetables. Fruits All fresh, dried, or frozen fruit. Canned fruit in natural juice (without added sugar). Meat and other protein foods Skinless chicken or [place]. Ground chicken or [place]. Pork with fat trimmed off. Fish and seafood. Egg whites. Dried beans, peas, or lentils. Unsalted nuts, nut butters, and seeds. Unsalted canned beans. Lean cuts of beef with fat trimmed off. Low-sodium, lean deli meat. Dairy Low-fat (1%) or fat-free (skim) milk. Fat-free, low-fat, or reduced-fat cheeses. Nonfat, low-sodium ricotta or cottage cheese. Low-fat or nonfat yogurt. Low-fat, low-sodium cheese. Fats and oils Soft margarine without trans fats. Vegetable oil. Low-fat, reduced-fat, or light mayonnaise and salad dressings (reduced-sodium). Canola, safflower, olive, soybean, and sunflower oils. Avocado. Seasoning and other foods Herbs. Spices. Seasoning mixes without salt. Unsalted popcorn and pretzels. Fat-free sweets. What foods are not recommended? The items listed may not be a complete list. Talk with your dietitian about what dietary choices are best for you. Grains Baked goods made with fat, such as croissants, muffins, or some breads. Dry pasta or rice meal packs. Vegetables Creamed or fried vegetables. Vegetables in a cheese sauce. Regular canned vegetables (not low-sodium or reduced-sodium). Regular canned tomato sauce and paste (not low-sodium or reduced-sodium). Regular tomato and vegetable juice (not low-sodium or reduced-sodium). Angie Fava. Olives. Fruits Canned fruit in a light or heavy syrup. Fried fruit. Fruit in cream or butter sauce. Meat and other protein foods Fatty cuts of meat. Ribs. Fried meat. Berniece Salines. Sausage. Bologna and other processed lunch meats. Salami. Fatback. Hotdogs. Bratwurst. Salted nuts and seeds. Canned beans  with added salt. Canned or smoked fish. Whole eggs or egg yolks. Chicken or [place] with skin. Dairy Whole or 2% milk, cream, and half-and-half. Whole or full-fat cream cheese. Whole-fat or sweetened yogurt. Full-fat cheese. Nondairy creamers. Whipped toppings. Processed cheese and cheese spreads. Fats and oils Butter. Stick margarine. Lard. Shortening. Ghee. Bacon fat. Tropical oils, such as coconut, palm kernel, or palm oil. Seasoning and other foods Salted popcorn and pretzels. Onion salt, garlic salt, seasoned salt, table salt, and sea salt. Worcestershire sauce. Tartar sauce. Barbecue sauce. Teriyaki sauce. Soy sauce, including reduced-sodium. Steak sauce. Canned and packaged gravies. Fish sauce. Oyster sauce. Cocktail sauce. Horseradish that you find on the shelf. Ketchup. Mustard. Meat flavorings and tenderizers. Bouillon cubes. Hot sauce and Tabasco sauce. Premade or packaged marinades. Premade or packaged taco seasonings. Relishes. Regular salad dressings. Where to find more information:  National Heart, Lung, and [place]: https://wilson-eaton.com/  American Heart Association: www.heart.org Summary  The DASH eating plan is a healthy eating plan that has been shown to reduce high blood pressure (hypertension). It may also reduce your risk for type 2 diabetes, heart disease, and stroke.  With the DASH eating plan, you should limit salt (sodium) intake to 2,300 mg a day. If you have hypertension, you may need to reduce your sodium intake to 1,500 mg a day.  When on  the DASH eating plan, aim to eat more fresh fruits and vegetables, whole grains, lean proteins, low-fat dairy, and heart-healthy fats.  Work with your health care provider or diet and nutrition specialist (dietitian) to adjust your eating plan to your individual calorie needs. This information is not intended to replace advice given to you by your health care provider. Make sure you discuss any questions you have with your health  care provider. Document Released: 03/24/2011 Document Revised: 03/28/2016 Document Reviewed: 03/28/2016 Elsevier Interactive Patient Education  [employer].

## 2017-04-28 ENCOUNTER — Other Ambulatory Visit: Payer: Self-pay | Admitting: Family Medicine

## 2017-04-28 MED ORDER — LOSARTAN POTASSIUM 50 MG PO TABS: 50.0000 mg | ORAL_TABLET | Freq: Every day | ORAL | 0 refills | Status: DC

## 2017-04-28 NOTE — Progress Notes (Signed)
Add losartan 50 mg daily Return in 10-12 days for recheck BMP and BP check with CMA

## 2017-04-30 NOTE — Assessment & Plan Note (Signed)
Start back on CCB; will add back another agent once I see his creatinine; referring to nephrologist; DASH guidelines

## 2017-05-11 ENCOUNTER — Ambulatory Visit: Payer: BLUE CROSS/BLUE SHIELD | Admitting: Family Medicine

## 2017-05-23 ENCOUNTER — Telehealth: Payer: Self-pay

## 2017-05-23 NOTE — Telephone Encounter (Signed)
Copied from [place] [id number]. Topic: General - Other >> May 23, 2017  8:27 AM Valla Leaver wrote: Reason for CRM: Patient would like the labs Dr. Sanda Klein wants him to have done forwarded to his Nephrologist to be drawn. The doctor works at [employer] and the patient is supposed to go there tomorrow between 8am and 12pm (walk-in) to have the labs drawn. Patient would like a call from Dr. Sanda Klein cma patient to confirm orders were sent.   Please see message above and advise, I do not see any future orders for this pt? Thanks!

## 2017-05-23 NOTE — Telephone Encounter (Signed)
Please see the lab note on the CMP dated 04/27/17; my request for lab orders to be put in is in that note; thank you

## 2017-05-24 ENCOUNTER — Other Ambulatory Visit: Payer: Self-pay

## 2017-05-24 DIAGNOSIS — N179 Acute kidney failure, unspecified: Secondary | ICD-10-CM | POA: Diagnosis not present

## 2017-05-24 DIAGNOSIS — R319 Hematuria, unspecified: Secondary | ICD-10-CM | POA: Diagnosis not present

## 2017-05-24 DIAGNOSIS — I10 Essential (primary) hypertension: Secondary | ICD-10-CM | POA: Diagnosis not present

## 2017-05-24 DIAGNOSIS — M109 Gout, unspecified: Secondary | ICD-10-CM | POA: Diagnosis not present

## 2017-05-24 NOTE — Telephone Encounter (Signed)
Pt wife notified all we needed was a BMP which is basically a kidney function panel, which is what they will most likely be drawing at the kidney doctor.  I told wife to be sure to tell them to fax labs to [place].

## 2017-05-24 NOTE — Telephone Encounter (Signed)
Pt notified would need an appt

## 2017-05-29 ENCOUNTER — Encounter: Payer: Self-pay | Admitting: Family Medicine

## 2017-05-29 ENCOUNTER — Ambulatory Visit (INDEPENDENT_AMBULATORY_CARE_PROVIDER_SITE_OTHER): Payer: BLUE CROSS/BLUE SHIELD | Admitting: Family Medicine

## 2017-05-29 VITALS — BP 142/74 | HR 99 | Temp 98.0°F | Resp 14 | Wt 219.0 lb

## 2017-05-29 DIAGNOSIS — G8929 Other chronic pain: Secondary | ICD-10-CM | POA: Diagnosis not present

## 2017-05-29 DIAGNOSIS — N2889 Other specified disorders of kidney and ureter: Secondary | ICD-10-CM

## 2017-05-29 DIAGNOSIS — Z79899 Other long term (current) drug therapy: Secondary | ICD-10-CM | POA: Diagnosis not present

## 2017-05-29 DIAGNOSIS — Q613 Polycystic kidney, unspecified: Secondary | ICD-10-CM | POA: Diagnosis not present

## 2017-05-29 DIAGNOSIS — I151 Hypertension secondary to other renal disorders: Secondary | ICD-10-CM | POA: Diagnosis not present

## 2017-05-29 DIAGNOSIS — M545 Low back pain: Secondary | ICD-10-CM

## 2017-05-29 MED ORDER — HYDROCODONE-ACETAMINOPHEN 5-325 MG PO TABS: 0.5000 | ORAL_TABLET | Freq: Four times a day (QID) | ORAL | 0 refills | Status: DC | PRN

## 2017-05-29 NOTE — Patient Instructions (Signed)
Return in 4 weeks for follow-up, and then every 3 months after that

## 2017-05-29 NOTE — Progress Notes (Signed)
BP (!) 142/74   Pulse 99   Temp 98 F (36.7 C) (Oral)   Resp 14   Wt 219 lb (99.3 kg)   SpO2 98%   BMI 29.46 kg/m    Subjective:    Patient ID: David [name], male    DOB: May 09, 1970, 47 y.o.   MRN: [id number]  HPI: David [name] is a 47 y.o. male  Chief Complaint  Patient presents with  . Follow-up  . Medication Refill    HPI He is here for f/u Was diagnosed with PCKD by neprhologist; he and his wife are facing the diagnosis, it is what it is; he denies SI/HI He is having pain, lower back and lower abdomen; some days it gets pretty bad The majority of time is dull, but will catch sharp pains from time to time; nephrologist thinks the pain is related to the kidney disease; nephrologist said I need to manage his pain The pain medicine takes the edge off but does not make him high or drunk-feeling No alcohol; no recreational drugs He had a bad episode the other day; he had so much pain that it made him nauseated Nephrologist is managing his HTN  Depression screen [facility] 2/9 05/29/2017 04/27/2017  Decreased Interest 0 0  Down, Depressed, Hopeless 0 0  PHQ - 2 Score 0 0    Relevant past medical, surgical, family and social history reviewed Past Medical History:  Diagnosis Date  . Eczema   . Gout   . Gynecomastia, male left  . History of pyelonephritis Jan 2017  . Hyperlipidemia   . Hypertension   . Polycystic kidney disease   . Snoring    Past Surgical History:  Procedure Laterality Date  . NO PAST SURGERIES     Family History  Problem Relation Age of Onset  . Hypertension Mother   . Kidney disease Father   . Hypertension Father   . Dementia Father   . Squamous cell carcinoma Father   . Cancer Father        SCC  . Diabetes Father   . Heart disease Father   . Cancer Brother        hodgkin's lymphoma  . Heart disease Brother 78       3 vessel CABG  . Heart disease Maternal Grandmother   . Stroke Maternal Grandmother   . Heart disease Maternal Grandfather    . Stroke Maternal Grandfather   . Heart disease Paternal Grandmother   . Heart disease Paternal Grandfather    Social History   Tobacco Use  . Smoking status: Never Smoker  . Smokeless tobacco: Former Systems developer    Types: Chew  Substance Use Topics  . Alcohol use: No  . Drug use: No   Interim medical history since last visit reviewed. Allergies and medications reviewed  Review of Systems Per HPI unless specifically indicated above     Objective:    BP (!) 142/74   Pulse 99   Temp 98 F (36.7 C) (Oral)   Resp 14   Wt 219 lb (99.3 kg)   SpO2 98%   BMI 29.46 kg/m   Wt Readings from Last 3 Encounters:  05/29/17 219 lb (99.3 kg)  04/27/17 220 lb 1.6 oz (99.8 kg)  06/30/15 222 lb (100.7 kg)    Physical Exam  Constitutional: He appears well-developed and well-nourished. No distress.  Eyes: No scleral icterus.  Cardiovascular: Normal rate and regular rhythm.  Pulmonary/Chest: Effort normal and breath sounds normal.  Abdominal: He exhibits no distension.  Neurological: He is alert.  Skin: No pallor.  Psychiatric: He has a normal mood and affect. He expresses no homicidal and no suicidal ideation.  Good eye contact with examiner   Results for orders placed or performed in visit on 04/27/17  Comprehensive Metabolic Panel (CMET)  Result Value Ref Range   Glucose, Bld 92 65 - 99 mg/dL   BUN 11 7 - 25 mg/dL   Creat 0.87 0.60 - 1.35 mg/dL   BUN/Creatinine Ratio NOT APPLICABLE 6 - 22 (calc)   Sodium 142 135 - 146 mmol/L   Potassium 3.7 3.5 - 5.3 mmol/L   Chloride 106 98 - 110 mmol/L   CO2 30 20 - 32 mmol/L   Calcium 9.4 8.6 - 10.3 mg/dL   Total Protein 7.5 6.1 - 8.1 g/dL   Albumin 4.6 3.6 - 5.1 g/dL   Globulin 2.9 1.9 - 3.7 g/dL (calc)   AG Ratio 1.6 1.0 - 2.5 (calc)   Total Bilirubin 0.5 0.2 - 1.2 mg/dL   Alkaline phosphatase (APISO) 84 40 - 115 U/L   AST 17 10 - 40 U/L   ALT 11 9 - 46 U/L  CBC with Differential/Platelet  Result Value Ref Range   WBC 8.6 3.8 - 10.8  Thousand/uL   RBC 5.28 4.20 - 5.80 Million/uL   Hemoglobin 15.1 13.2 - 17.1 g/dL   HCT 43.4 38.5 - 50.0 %   MCV 82.2 80.0 - 100.0 fL   MCH 28.6 27.0 - 33.0 pg   MCHC 34.8 32.0 - 36.0 g/dL   RDW 12.7 11.0 - 15.0 %   Platelets 234 140 - 400 Thousand/uL   MPV 11.1 7.5 - 12.5 fL   Neutro Abs 4,979 1,500 - 7,800 cells/uL   Lymphs Abs 2,442 850 - 3,900 cells/uL   WBC mixed population 929 200 - 950 cells/uL   Eosinophils Absolute 189 15 - 500 cells/uL   Basophils Absolute 60 0 - 200 cells/uL   Neutrophils Relative % 57.9 %   Total Lymphocyte 28.4 %   Monocytes Relative 10.8 %   Eosinophils Relative 2.2 %   Basophils Relative 0.7 %  POCT Urinalysis Dipstick  Result Value Ref Range   Color, UA yellow    Clarity, UA clear    Glucose, UA neg    Bilirubin, UA neg    Ketones, UA neg    Spec Grav, UA 1.015 1.010 - 1.025   Blood, UA neg    pH, UA 5.0 5.0 - 8.0   Protein, UA neg    Urobilinogen, UA 0.2 0.2 or 1.0 E.U./dL   Nitrite, UA neg    Leukocytes, UA Negative Negative   Appearance     Odor        Assessment & Plan:   Problem List Items Addressed This Visit      Cardiovascular and Mediastinum   Hypertension    Managed by nephrologist        Genitourinary   Polycystic kidney disease    Managed by nephrologist; patient sounds to be coping with new diagnosis fairly well, as to be expected; no SI/HI        Other   Chronic lower back pain - Primary    Thought to be related to PCKD per nephrologist; he has undergone extensive testing, imaging; I do not think referral to a back specialist is warranted at this time; will provide chronic pain medicine per policy; one month Rx given and he'll return in  one month, then will plan to go every three months; signs of red flags reviewed; reiterated that if he gets pain medicine from another provider for a broken ankle, e.g., only take that doctor's medicine and call us the very next business day, otherwise I cannot continue to prescribe;  he understands Discussed risk of controlled substances including possible unintentional overdose, especially if mixed with alcohol or other pills; typical speech given including illegal to share, even out of the goodness of patient's heart, always keep in the original bottle, safeguard medicine, do NOT mix with alcohol, other pain pills, "nerve" or anxiety pills, or sleeping pills; I am not obligated to approve of early refill or give new prescription if medicine is lost, stolen, or destroyed even with a police report, etc.; patient agrees with plan; controlled substance contract signed; copy of contract given to patient      Relevant Medications   HYDROcodone-acetaminophen (NORCO/VICODIN) 5-325 MG tablet    Other Visit Diagnoses    Medication management       Relevant Orders   Urine Drug Screen w/Alc, no confirm       Follow up plan: No Follow-up on file.  An after-visit summary was printed and given to the patient at [place].  Please see the patient instructions which may contain other information and recommendations beyond what is mentioned above in the assessment and plan.  Meds ordered this encounter  Medications  . HYDROcodone-acetaminophen (NORCO/VICODIN) 5-325 MG tablet    Sig: Take 0.5-1 tablets by mouth every 6 (six) hours as needed for moderate pain or severe pain.    Dispense:  30 tablet    Refill:  0    On controlled substance    Orders Placed This Encounter  Procedures  . Urine Drug Screen w/Alc, no confirm

## 2017-05-29 NOTE — Assessment & Plan Note (Signed)
Managed by nephrologist; patient sounds to be coping with new diagnosis fairly well, as to be expected; no SI/HI

## 2017-05-29 NOTE — Assessment & Plan Note (Signed)
Thought to be related to PCKD per nephrologist; he has undergone extensive testing, imaging; I do not think referral to a back specialist is warranted at this time; will provide chronic pain medicine per policy; one month Rx given and he'll return in one month, then will plan to go every three months; signs of red flags reviewed; reiterated that if he gets pain medicine from another provider for a broken ankle, e.g., only take that doctor's medicine and call us the very next business day, otherwise I cannot continue to prescribe; he understands Discussed risk of controlled substances including possible unintentional overdose, especially if mixed with alcohol or other pills; typical speech given including illegal to share, even out of the goodness of patient's heart, always keep in the original bottle, safeguard medicine, do NOT mix with alcohol, other pain pills, "nerve" or anxiety pills, or sleeping pills; I am not obligated to approve of early refill or give new prescription if medicine is lost, stolen, or destroyed even with a police report, etc.; patient agrees with plan; controlled substance contract signed; copy of contract given to patient

## 2017-05-29 NOTE — Assessment & Plan Note (Signed)
Managed by nephrologist 

## 2017-05-31 LAB — DRUG SCREEN URINE W/ALC, NO CONF
ALCOHOL, ETHYL (U): NEGATIVE
AMPHETAMINES (1000 ng/mL SCRN): NEGATIVE
BARBITURATES: NEGATIVE
BENZODIAZEPINES: NEGATIVE
COCAINE METABOLITES: NEGATIVE
MARIJUANA MET (50 ng/mL SCRN): NEGATIVE
METHADONE: NEGATIVE
METHAQUALONE: NEGATIVE
OPIATES: NEGATIVE
PHENCYCLIDINE: NEGATIVE
PROPOXYPHENE: NEGATIVE

## 2017-06-29 ENCOUNTER — Telehealth: Payer: Self-pay

## 2017-06-29 NOTE — Telephone Encounter (Signed)
Left detailed voicemail   Copied from [place] [id number]. Topic: Appointment Scheduling - Scheduling Inquiry for Clinic >> Jun 29, 2017 11:21 AM Margot Ables wrote: Reason for CRM: pt states he was to f/u in 4 weeks from last OV 05/29/17 - next avail 07/18/17 - pt stating he needs sooner appt and refill on hydrocodone for back pain. Please advise.  Pt states he is available all day today at [phone]

## 2017-06-29 NOTE — Telephone Encounter (Signed)
Per my last note: "will provide chronic pain medicine per policy; one month Rx given and he'll return in one month, then will plan to go every three months"  His instructions said to return in 4 weeks, for follow-up then every 3 months after that  I'm sorry, but he should have made an appointment already; I have to stick with my rules

## 2017-06-30 ENCOUNTER — Ambulatory Visit: Payer: BLUE CROSS/BLUE SHIELD | Admitting: Family Medicine

## 2017-07-03 ENCOUNTER — Telehealth: Payer: Self-pay | Admitting: Family Medicine

## 2017-07-03 ENCOUNTER — Encounter: Payer: Self-pay | Admitting: Family Medicine

## 2017-07-03 ENCOUNTER — Ambulatory Visit (INDEPENDENT_AMBULATORY_CARE_PROVIDER_SITE_OTHER): Payer: BLUE CROSS/BLUE SHIELD | Admitting: Family Medicine

## 2017-07-03 VITALS — BP 144/82 | HR 84 | Temp 98.7°F | Resp 14 | Wt 221.4 lb

## 2017-07-03 DIAGNOSIS — I151 Hypertension secondary to other renal disorders: Secondary | ICD-10-CM

## 2017-07-03 DIAGNOSIS — Q613 Polycystic kidney, unspecified: Secondary | ICD-10-CM | POA: Diagnosis not present

## 2017-07-03 DIAGNOSIS — G8929 Other chronic pain: Secondary | ICD-10-CM | POA: Diagnosis not present

## 2017-07-03 DIAGNOSIS — M545 Low back pain, unspecified: Secondary | ICD-10-CM

## 2017-07-03 DIAGNOSIS — N2889 Other specified disorders of kidney and ureter: Secondary | ICD-10-CM

## 2017-07-03 DIAGNOSIS — Z5181 Encounter for therapeutic drug level monitoring: Secondary | ICD-10-CM | POA: Diagnosis not present

## 2017-07-03 DIAGNOSIS — Z9114 Patient's other noncompliance with medication regimen: Secondary | ICD-10-CM

## 2017-07-03 LAB — BASIC METABOLIC PANEL
BUN: 13 mg/dL (ref 7–25)
CALCIUM: 10.1 mg/dL (ref 8.6–10.3)
CHLORIDE: 101 mmol/L (ref 98–110)
CO2: 32 mmol/L (ref 20–32)
Creat: 1.04 mg/dL (ref 0.60–1.35)
GLUCOSE: 113 mg/dL — AB (ref 65–99)
Potassium: 3.2 mmol/L — ABNORMAL LOW (ref 3.5–5.3)
SODIUM: 142 mmol/L (ref 135–146)

## 2017-07-03 MED ORDER — LOSARTAN POTASSIUM 100 MG PO TABS: 100.0000 mg | ORAL_TABLET | Freq: Every day | ORAL | 3 refills | Status: DC

## 2017-07-03 NOTE — Assessment & Plan Note (Signed)
Patient believes due to his kidney problem; I explained that I am unfortunately not going to be able to prescribe him any more narcotic pain medicine since he chose to take more than I prescribed (took one and one-half pills at a time, when the prescription said to take a half or whole pill at a time); I can speak with the kidney doctor about using NSAIDs, but obviously that may be an issue with his kidneys; not sure if low dose prednisone would be appropriate; other option is to have him see someone at the pain clinic for further management and work-up, other treatment modalities to be considered; he agrees with this plan and understands about risk of accidental overdose, need to take medicines exactly as prescribed and why I won't be able to provide any further controlled pain medicine

## 2017-07-03 NOTE — Progress Notes (Signed)
BP (!) 144/82   Pulse 84   Temp 98.7 F (37.1 C) (Oral)   Resp 14   Wt 221 lb 6.4 oz (100.4 kg)   SpO2 97%   BMI 29.78 kg/m    Subjective:    Patient ID: David [name], male    DOB: 1971/03/23, 47 y.o.   MRN: [id number]  HPI: DERMOT GREMILLION is a 47 y.o. male  Chief Complaint  Patient presents with  . Follow-up  . Medication Refill    HPI Patient is here for chronic pain I asked how the medicine has done for him; he is taking 1-1/2 pills at a time to get any comfort (I actually only prescribed it for 1/2 to 1 pill at a time) He says some days are really bad Some days the pain is there, but not as bad as other days He is new to this; he hates taking anything any way Some days he cannot get out of the bed, cannot get comfortable Medicine does not make him feel goofy or loopy or drunk No constipation For pain, he'll use hot packs, cold packs; he'll stretch in the mornings and evenings Job is somewhat physical; he is a Biomedical scientist over heating and air; just occasionally going out to work sites, but primarily mostly desk job or riding from [place] to [place]; 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 Pain is likely all due to kidney disease; no hx of musculoskeletal back problems He still sees the kidney doctor; sees him within the next month He was told this was genetic; father had dialysis and transplant; he has brother, no kidney problems but had triple bypass surgery, he is 49 years old now; 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 Eczema   . Gout   . Gynecomastia, male left  . History of pyelonephritis Jan 2017  . Hyperlipidemia   . Hypertension   . Polycystic kidney disease   . Snoring    Past Surgical History:  Procedure Laterality Date  . NO PAST SURGERIES     Family History  Problem Relation Age of Onset  . Hypertension Mother   . Kidney disease Father   . Hypertension Father   . Dementia Father   . Squamous cell carcinoma Father   . Cancer Father        SCC  . Diabetes Father   . Heart disease Father   . Cancer Brother        hodgkin's lymphoma  . Heart disease Brother 1       3 vessel CABG  . Heart disease Maternal Grandmother   . Stroke Maternal Grandmother   . Heart disease Maternal Grandfather   . Stroke Maternal Grandfather   . Heart disease Paternal Grandmother   . Heart disease Paternal Grandfather    Social History   Tobacco Use  . Smoking status: Never Smoker  . Smokeless tobacco: Former Systems developer    Types: Adult nurse  Topics  . Alcohol use: No  . Drug use: No    Interim medical history since last visit reviewed. Allergies and medications reviewed  Review of Systems Per HPI unless specifically indicated above     Objective:    BP (!) 144/82   Pulse 84   Temp 98.7 F (37.1 C) (Oral)   Resp 14   Wt 221 lb 6.4 oz (100.4 kg)   SpO2 97%   BMI 29.78 kg/m   Wt Readings from Last 3 Encounters:  07/03/17 221 lb 6.4 oz (100.4 kg)  05/29/17 219 lb (99.3 kg)  04/27/17 220 lb 1.6 oz (99.8 kg)    Physical Exam  Constitutional: He appears well-developed and well-nourished. No distress.  Eyes: No scleral icterus.  Cardiovascular: Normal rate and regular rhythm.  Pulmonary/Chest: Effort normal and breath sounds normal.  Abdominal: He exhibits no distension. There is CVA tenderness (bilaterally).  Musculoskeletal:       Lumbar back: He exhibits tenderness.       Back:  Neurological: He is alert.  Skin: No pallor.  Psychiatric: He has  a normal mood and affect. His mood appears not anxious. He does not exhibit a depressed mood.  Good eye contact with examiner    Results for orders placed or performed in visit on 05/29/17  Urine Drug Screen w/Alc, no confirm  Result Value Ref Range   Please note     AMPHETAMINES (1000 ng/mL SCRN) NEGATIVE    BARBITURATES NEGATIVE    BENZODIAZEPINES NEGATIVE    COCAINE METABOLITES NEGATIVE    MARIJUANA MET (50 ng/mL SCRN) NEGATIVE    METHADONE NEGATIVE    METHAQUALONE NEGATIVE    OPIATES NEGATIVE    PHENCYCLIDINE NEGATIVE    PROPOXYPHENE NEGATIVE    ALCOHOL, ETHYL (U) NEGATIVE       Assessment & Plan:   Problem List Items Addressed This Visit      Cardiovascular and Mediastinum   Hypertension - Primary    Increase losartan to 100 mg daily; check BMP today (he did not return for recheck BMP last time, so getting one today and then will get another in 7-10 days after the increase); avoid decongestants; avoid salt substitutes      Relevant Medications   losartan (COZAAR) 100 MG tablet   Other Relevant Orders   Basic Metabolic Panel (BMET)     Genitourinary   Polycystic kidney disease    Patient seeing nephrologist        Other   Medication monitoring encounter   Relevant Orders   Basic Metabolic Panel (BMET)   Chronic lower back pain    Patient believes due to his kidney problem; I explained that I am unfortunately not going to be able to prescribe him any more narcotic pain medicine since he chose to take more than I prescribed (took one and one-half pills at a time, when the prescription said to take a half or whole pill at a time); I can speak with the kidney doctor about using NSAIDs, but obviously that may be an issue with his kidneys; not sure if low dose prednisone would be appropriate; other option is to have him see someone at the pain clinic for further management and work-up, other treatment modalities to be considered; he agrees with this plan and understands  about risk of accidental overdose, need to take medicines exactly as prescribed and why I won't be able to provide any further controlled pain medicine       Other  Visit Diagnoses    Controlled substance agreement broken       patient took 50% more than the prescribed amount of hydrocodone; I explained that I cannot prescribe him any more narcotics; he understands       Follow up plan: No Follow-up on file.  An after-visit summary was printed and given to the patient at [facility].  Please see the patient instructions which may contain other information and recommendations beyond what is mentioned above in the assessment and plan.  Meds ordered this encounter  Medications  . losartan (COZAAR) 100 MG tablet    Sig: Take 1 tablet (100 mg total) by mouth daily.    Dispense:  90 tablet    Refill:  3    Increasing losartan strength from 50 mg to 100 mg    Orders Placed This Encounter  Procedures  . Basic Metabolic Panel (BMET)

## 2017-07-03 NOTE — Assessment & Plan Note (Signed)
Patient seeing nephrologist

## 2017-07-03 NOTE — Assessment & Plan Note (Signed)
Increase losartan to 100 mg daily; check BMP today (he did not return for recheck BMP last time, so getting one today and then will get another in 7-10 days after the increase); avoid decongestants; avoid salt substitutes

## 2017-07-03 NOTE — Patient Instructions (Addendum)
If you have not heard anything from my staff in a week about any orders/referrals/studies from today, please contact us here to follow-up [phone] I'll talk with your kidney doctor about best approaches to deal with your pain and contact you after I hear from him Try turmeric as a natural anti-inflammatory (for pain and arthritis). It comes in capsules where you buy aspirin and fish oil, but also as a spice where you buy pepper and garlic powder. Hydrate really well If you need something for aches or pains, try to use Tylenol (acetaminophen) instead of non-steroidals (which include Aleve, ibuprofen, Advil, Motrin, and naproxen); non-steroidals can cause long-term kidney damage Please do see the kidney doctor in April Check your blood pressure and let me know of any changes or lack of control (want it under 140/90 at least, under 120/80 is ideal but double check with your kidney specialist) Increase your losartan Return just for labs in 7-10 days to make sure kidneys are tolerating the higher dose

## 2017-07-03 NOTE — Telephone Encounter (Signed)
Let's call Dr. Juleen China to see if anti-inflammatory approach would be helpful For bilateral back pain thought to be due to kidneys Ask him if prednisone at even 5 mg would be indicated

## 2017-07-03 NOTE — Telephone Encounter (Signed)
I left message for Dr. Juleen China, asked for return call Sending copy of office note and would appreciate either a fax back or call back to my cell Thank you -------------------------------------------- [facility] staff - please fax a copy of my office note to Dr. Juleen China Thank you

## 2017-07-04 ENCOUNTER — Other Ambulatory Visit: Payer: Self-pay | Admitting: Family Medicine

## 2017-07-04 MED ORDER — MELOXICAM 15 MG PO TABS: 15.0000 mg | ORAL_TABLET | Freq: Every day | ORAL | 0 refills | Status: DC

## 2017-07-04 MED ORDER — POTASSIUM CHLORIDE ER 10 MEQ PO TBCR: 10.0000 meq | EXTENDED_RELEASE_TABLET | Freq: Two times a day (BID) | ORAL | 0 refills | Status: DC

## 2017-07-04 NOTE — Telephone Encounter (Signed)
-----   Message from Lavonia Dana, MD sent at 07/04/2017 10:53 AM EDT ----- I received your note on Mr. Banks. He has preserved renal function so he could do a short course of NSAIDs if you deem appropriate (1 week). I think steroids such as prednisone is also a great idea. Like you, I am not comfortable with narcotic agents and if patient wants to go that route, he is best to establish with a pain specialist.   Thanks   Lavonia Dana

## 2017-07-04 NOTE — Telephone Encounter (Signed)
Called pt informed him of information below. Pt gave verbal understanding.  

## 2017-07-04 NOTE — Telephone Encounter (Signed)
Please let patient know that I've communicated with his kidney doctor and we'll try a short course of anti-inflammatory

## 2017-07-04 NOTE — Progress Notes (Signed)
klor BID x 2 days; low dose since on ARB

## 2017-07-10 ENCOUNTER — Telehealth: Payer: Self-pay | Admitting: Family Medicine

## 2017-07-10 MED ORDER — NAPROXEN 375 MG PO TABS: 375.0000 mg | ORAL_TABLET | Freq: Two times a day (BID) | ORAL | 0 refills | Status: DC

## 2017-07-10 NOTE — Telephone Encounter (Signed)
Copied from [place] [id number]. Topic: Quick Communication - See Telephone Encounter >> Jul 10, 2017 10:03 AM Conception Chancy, NT wrote: CRM for notification. See Telephone encounter for: 07/10/17.  Patient is calling and states the steriods is not helping him sleep at night that Dr. Sanda Klein prescribed him. He is at work and does not know the names of the RX but would like something else called in. [place], [place] [address]  Phone: [phone] Fax: [phone]

## 2017-07-10 NOTE — Telephone Encounter (Signed)
Naproxen 375

## 2018-04-30 ENCOUNTER — Ambulatory Visit: Payer: BLUE CROSS/BLUE SHIELD | Admitting: Family Medicine

## 2018-05-26 ENCOUNTER — Telehealth: Payer: Self-pay | Admitting: Family Medicine

## 2018-05-27 NOTE — Telephone Encounter (Signed)
Patient needs an appointment please Last visit was 11 months ago I'll refill med

## 2018-05-28 NOTE — Telephone Encounter (Signed)
lvm asking pt to schedule appt. Pt also informed that script has been sent to pharmacy

## 2018-06-11 ENCOUNTER — Other Ambulatory Visit: Payer: Self-pay | Admitting: Family Medicine

## 2018-07-01 ENCOUNTER — Telehealth: Payer: Self-pay | Admitting: Family Medicine

## 2018-07-01 MED ORDER — AMLODIPINE BESYLATE 10 MG PO TABS: 10.0000 mg | ORAL_TABLET | Freq: Every day | ORAL | 0 refills | Status: DC

## 2018-07-01 NOTE — Telephone Encounter (Signed)
Please see note from February Patient has not been seen since March of 2019 I'll send limited Rx, and please get him on schedule with David [name] if needed for HTN Thank you

## 2018-07-02 NOTE — Telephone Encounter (Signed)
Left voice message asking pt to return call to schedule appt with Dr Sanda Klein or Benjamine Mola. Also informed that his prescription has been sent in with a limited supply

## 2018-07-05 ENCOUNTER — Other Ambulatory Visit: Payer: Self-pay | Admitting: Family Medicine

## 2018-07-05 ENCOUNTER — Telehealth: Payer: Self-pay | Admitting: Family Medicine

## 2018-07-05 NOTE — Telephone Encounter (Signed)
Copied from [place] [id number]. Topic: Quick Communication - See Telephone Encounter >> Jul 05, 2018  9:47 AM Burchel, Abbi R wrote: CRM for notification. See Telephone encounter for: 07/05/18.  Pt states he is having gout pain in his foot and would like to request that Dr Sanda Klein call in something to the pharmacy for him.  Please advise.   [facility] [address], [place] - [place] [phone] (Phone) [phone] (Fax)  Pt: [phone]

## 2018-07-05 NOTE — Telephone Encounter (Signed)
Left detailed voicemail

## 2018-07-05 NOTE — Telephone Encounter (Signed)
Or does he need appt

## 2018-07-05 NOTE — Telephone Encounter (Signed)
Please assist patient with getting a MyChart account and direct him to do an e-visit please

## 2018-07-12 ENCOUNTER — Encounter: Payer: Self-pay | Admitting: Nurse Practitioner

## 2018-07-12 ENCOUNTER — Other Ambulatory Visit: Payer: Self-pay

## 2018-07-12 ENCOUNTER — Ambulatory Visit (INDEPENDENT_AMBULATORY_CARE_PROVIDER_SITE_OTHER): Payer: Managed Care, Other (non HMO) | Admitting: Nurse Practitioner

## 2018-07-12 DIAGNOSIS — M545 Low back pain: Secondary | ICD-10-CM

## 2018-07-12 DIAGNOSIS — I151 Hypertension secondary to other renal disorders: Secondary | ICD-10-CM

## 2018-07-12 DIAGNOSIS — M10371 Gout due to renal impairment, right ankle and foot: Secondary | ICD-10-CM

## 2018-07-12 DIAGNOSIS — Q613 Polycystic kidney, unspecified: Secondary | ICD-10-CM

## 2018-07-12 DIAGNOSIS — G8929 Other chronic pain: Secondary | ICD-10-CM

## 2018-07-12 DIAGNOSIS — N2889 Other specified disorders of kidney and ureter: Secondary | ICD-10-CM

## 2018-07-12 MED ORDER — HYDROCHLOROTHIAZIDE 25 MG PO TABS: 25.0000 mg | ORAL_TABLET | Freq: Every day | ORAL | 1 refills | Status: DC

## 2018-07-12 MED ORDER — AMLODIPINE BESYLATE 10 MG PO TABS: 10.0000 mg | ORAL_TABLET | Freq: Every day | ORAL | 1 refills | Status: DC

## 2018-07-12 MED ORDER — METOPROLOL SUCCINATE ER 25 MG PO TB24: 12.5000 mg | ORAL_TABLET | Freq: Every day | ORAL | 0 refills | Status: DC

## 2018-07-12 MED ORDER — PREDNISONE 10 MG (21) PO TBPK: ORAL_TABLET | ORAL | 0 refills | Status: DC

## 2018-07-12 MED ORDER — LOSARTAN POTASSIUM 100 MG PO TABS: 100.0000 mg | ORAL_TABLET | Freq: Every day | ORAL | 1 refills | Status: DC

## 2018-07-12 NOTE — Patient Instructions (Addendum)
Some foods high in potassium are: avocados, bananas, coconut, sweet and regular potatoes, spinach, milk and lentils   Good cholesterol, also called high-density lipoprotein (HDL) removes extra cholesterol and plaque buildup in your arteries and then sends it to your liver to get rid of and helps reduce your risk of heart disease, heart attack, and stroke.Foods that increase HDL: beans and legumes, whole grains, high-fiber fruits:prunes, apples, and pears; fatty fish- salmon, tuna, sardines; nuts, olive oil

## 2018-07-12 NOTE — Progress Notes (Deleted)
Name: David [name]   MRN: [id number]    DOB: [birth year]   Date:07/12/2018       Progress Note  Subjective  Chief Complaint  Chief Complaint  Patient presents with  . Follow-up  . Gout    Flare up    I connected with Florentina Addison on 07/12/18 at 11:00 AM EDT by telephone and verified that I am speaking with the correct person using two identifiers.   Staff discussed the limitations, risks, security and privacy concerns of performing an evaluation and management service by telephone and the availability of in person appointments. The patient expressed understanding and agreed to proceed. Phone call is performed due to [place] pandemic.  HPI Gout Endorses pain right foot- big toe- is red, swollen and tender- has been ongoing for about a week, states pain has reduced and then got worse again. Denies known injury.  Had eaten red meat recently. States typical pain from his gout flare.   Hypertension rx amlodipine 10mg  daily, losartan 100mg  daily and HCTZ 25 mg daily; potassium supplement daily.  Does check it at home normally 160/90- states he checked it this morning and it was 155/81. States he did make his nephrologist aware. States generally has low salt diet.  Denies headaches, blurry vision, chest pain, lightheadedness BP Readings from Last 3 Encounters:  07/03/17 (!) 144/82  05/29/17 (!) 142/74  04/27/17 (!) 156/102    Hyperlipidemia Not on medication, discussed low HDL and ways to increase.  Lab Results  Component Value Date   CHOL 161 03/09/2015   HDL 39 (L) 03/09/2015   LDLCALC 96 03/09/2015   TRIG 131 03/09/2015   Polycystic kidney disese Sees Dr. Rolly Salter; states he does not manage BP just provides potasium supplements and is monitoring kidney function  Endoses chronic back pain, was on opioids before for chronic pain management, now taking goody powders without relief.  Denies fatigue  Endorses chronic rash on back and arms, states it is mildly uncomfortable.  Denies fevers, nausea, vomiting.     Patient Active Problem List   Diagnosis Date Noted  . Polycystic kidney disease 05/29/2017  . Chronic lower back pain 06/22/2015  . Elevated liver enzymes 05/31/2015  . Abdominal pain, right upper quadrant 05/08/2015  . Medication monitoring encounter 03/09/2015  . Tinea versicolor 03/09/2015  . Overweight (BMI 25.0-29.9) 03/09/2015  . Low HDL (under 40) 11/04/2014  . Gout   . Gynecomastia, male   . Eczema   . Hyperlipidemia   . Hypertension   . Snoring     Social History   Tobacco Use  . Smoking status: Never Smoker  . Smokeless tobacco: Former Systems developer    Types: Chew  Substance Use Topics  . Alcohol use: No     Current Outpatient Medications:  .  amLODipine (NORVASC) 10 MG tablet, Take 1 tablet (10 mg total) by mouth daily., Disp: 90 tablet, Rfl: 1 .  hydrochlorothiazide (HYDRODIURIL) 25 MG tablet, Take 1 tablet (25 mg total) by mouth daily., Disp: 90 tablet, Rfl: 1 .  losartan (COZAAR) 100 MG tablet, Take 1 tablet (100 mg total) by mouth daily., Disp: 90 tablet, Rfl: 1 .  naproxen (NAPROSYN) 375 MG tablet, Take 1 tablet (375 mg total) by mouth 2 (two) times daily with a meal. Short-term for back pain; do NOT take with meloxicam or other NSAIDs, Disp: 14 tablet, Rfl: 0 .  potassium chloride (KLOR-CON 10) 10 MEQ tablet, Take 1 tablet (10 mEq total) by mouth 2 (two) times  daily., Disp: 4 tablet, Rfl: 0 .  metoprolol succinate (TOPROL-XL) 25 MG 24 hr tablet, Take 0.5 tablets (12.5 mg total) by mouth daily., Disp: 30 tablet, Rfl: 0 .  predniSONE (STERAPRED UNI-PAK 21 TAB) 10 MG (21) TBPK tablet, Take as directed., Disp: 21 tablet, Rfl: 0  No Known Allergies  I personally reviewed {Reviewed:14835} with the patient/caregiver today.  ROS   Objective Patient checked pulse on phone reported 83 bpm BP at home cuff is reading 150-169/80- upper 90's;  Alert and oriented patient actively working on roof while on phone call and able to speak in  full sentences without difficulty   No results found for this or any previous visit (from the past 72 hour(s)).  Assessment & Plan  1. Hypertension secondary to other renal disorders Patient is asymptomatic but by self-report his blood pressures have been elevated for the past year, not acute issue. He takes medications as prescribed, will start BB discussed risks and benefits, will follow-up in office in one week due to patients request to be seen for chronic rash and to get blood work and recheck blood pressure.  - losartan (COZAAR) 100 MG tablet; Take 1 tablet (100 mg total) by mouth daily.  Dispense: 90 tablet; Refill: 1 - amLODipine (NORVASC) 10 MG tablet; Take 1 tablet (10 mg total) by mouth daily.  Dispense: 90 tablet; Refill: 1 - hydrochlorothiazide (HYDRODIURIL) 25 MG tablet; Take 1 tablet (25 mg total) by mouth daily.  Dispense: 90 tablet; Refill: 1 - metoprolol succinate (TOPROL-XL) 25 MG 24 hr tablet; Take 0.5 tablets (12.5 mg total) by mouth daily.  Dispense: 30 tablet; Refill: 0 - CMP 2. Polycystic kidney disease Follow-up with Dr. Rolly Salter, discussed avoiding NSAIDs and importance of hydration. -CMP (FUTURE) 3. Chronic bilateral low back pain without sciatica - predniSONE (STERAPRED UNI-PAK 21 TAB) 10 MG (21) TBPK tablet; Take as directed.  Dispense: 21 tablet; Refill: 0  4. Acute gout due to renal impairment involving toe of right foot Discussed limitations in treatment due to kidney disease and elevated blood pressure. Patient will check blood pressures at home start BB and report if further elevated. Will come in office in 10 days to schedule in person visit for rash/BP follow-up - predniSONE (STERAPRED UNI-PAK 21 TAB) 10 MG (21) TBPK tablet; Take as directed.  Dispense: 21 tablet; Refill: 0  unable to use video chat due to technical issues. will set up in office appointment in 10 days to re-evaluate blood pressure and rash. Discussed to come in sooner if needed.   -Red  flags and when to present for emergency care or RTC including fever >101.72F, chest pain, shortness of breath, new/worsening/un-resolving symptoms,  reviewed with patient at time of visit. Follow up and care instructions discussed and provided in AVS. - I discussed the assessment and treatment plan with the patient. The patient was provided an opportunity to ask questions and all were answered. The patient agreed with the plan and demonstrated an understanding of the instructions.  - The patient was advised to call back or seek an in-person evaluation if the symptoms worsen or if the condition fails to improve as anticipated.  I provided 22 minutes of non-face-to-face time during this encounter.  Fredderick Severance, NP

## 2018-07-12 NOTE — Progress Notes (Signed)
Virtual Visit via Telephone Note  I connected with David [name] on 07/12/18 at 11:00 AM EDT by telephone and verified that I am speaking with the correct person using two identifiers.   Staff discussed the limitations, risks, security and privacy concerns of performing an evaluation and management service by telephone and the availability of in person appointments.The patient expressed understanding and agreed to proceed.   History of Present Illness: Gout Endorses pain right foot- big toe- is red, swollen and tender- has been ongoing for about a week, states pain has reduced and then got worse again. Denies known injury.  Had eaten red meat recently. States typical pain from his gout flare.   Hypertension rx amlodipine 10mg  daily, losartan 100mg  daily and HCTZ 25 mg daily; potassium supplement daily.  Does check it at home normally 160/90- states he checked it this morning and it was 155/81. States he did make his nephrologist aware. States generally has low salt diet.  Denies headaches, blurry vision, chest pain, lightheadedness    BP Readings from Last 3 Encounters:  07/03/17 (!) 144/82  05/29/17 (!) 142/74  04/27/17 (!) 156/102    Hyperlipidemia Not on medication, discussed low HDL and ways to increase.  Recent Labs       Lab Results  Component Value Date   CHOL 161 03/09/2015   HDL 39 (L) 03/09/2015   LDLCALC 96 03/09/2015   TRIG 131 03/09/2015     Polycystic kidney disese Sees Dr. Rolly Salter; states he does not manage BP just provides potasium supplements and is monitoring kidney function  Endoses chronic back pain, was on opioids before for chronic pain management, now taking goody powders without relief.  Denies fatigue  Endorses chronic rash on back and arms, states it is mildly uncomfortable. Denies fevers, nausea, vomiting.      Observations/Objective: Patient checked pulse on phone reported 83 bpm BP at home cuff is reading 150-169/80- upper 90's;   Alert and oriented patient actively working on roof while on phone call and able to speak in full sentences without difficulty   Assessment and Plan:   1. Hypertension secondary to other renal disorders Patient is asymptomatic but by self-report his blood pressures have been elevated for the past year, not acute issue. He takes medications as prescribed, will start BB discussed risks and benefits, will follow-up in office in one week due to patients request to be seen for chronic rash and to get blood work and recheck blood pressure.  - losartan (COZAAR) 100 MG tablet; Take 1 tablet (100 mg total) by mouth daily.  Dispense: 90 tablet; Refill: 1 - amLODipine (NORVASC) 10 MG tablet; Take 1 tablet (10 mg total) by mouth daily.  Dispense: 90 tablet; Refill: 1 - hydrochlorothiazide (HYDRODIURIL) 25 MG tablet; Take 1 tablet (25 mg total) by mouth daily.  Dispense: 90 tablet; Refill: 1 - metoprolol succinate (TOPROL-XL) 25 MG 24 hr tablet; Take 0.5 tablets (12.5 mg total) by mouth daily.  Dispense: 30 tablet; Refill: 0 - CMP 2. Polycystic kidney disease Follow-up with Dr. Rolly Salter, discussed avoiding NSAIDs and importance of hydration. -CMP (FUTURE)  3. Chronic bilateral low back pain without sciatica - predniSONE (STERAPRED UNI-PAK 21 TAB) 10 MG (21) TBPK tablet; Take as directed.  Dispense: 21 tablet; Refill: 0  4. Acute gout due to renal impairment involving toe of right foot Discussed limitations in treatment due to kidney disease and elevated blood pressure. Patient will check blood pressures at home start BB and report if further  elevated. Will come in office in 10 days to schedule in person visit for rash/BP follow-up - predniSONE (STERAPRED UNI-PAK 21 TAB) 10 MG (21) TBPK tablet; Take as directed.  Dispense: 21 tablet; Refill: 0  unable to use video chat due to technical issues. will set up in office appointment in 10 days to re-evaluate blood pressure and rash. Discussed to come in  sooner if needed.  Follow Up Instructions:    I discussed the assessment and treatment plan with the patient. The patient was provided an opportunity to ask questions and all were answered. The patient agreed with the plan and demonstrated an understanding of the instructions.   The patient was advised to call back or seek an in-person evaluation if the symptoms worsen or if the condition fails to improve as anticipated.  I provided 22 minutes of non-face-to-face time during this encounter.   Fredderick Severance, NP

## 2018-07-30 ENCOUNTER — Ambulatory Visit (INDEPENDENT_AMBULATORY_CARE_PROVIDER_SITE_OTHER): Payer: Managed Care, Other (non HMO) | Admitting: Nurse Practitioner

## 2018-07-30 ENCOUNTER — Other Ambulatory Visit: Payer: Self-pay

## 2018-07-30 ENCOUNTER — Encounter: Payer: Self-pay | Admitting: Nurse Practitioner

## 2018-07-30 VITALS — BP 134/68 | HR 86 | Temp 98.9°F | Resp 16 | Ht 74.0 in | Wt 232.0 lb

## 2018-07-30 DIAGNOSIS — I151 Hypertension secondary to other renal disorders: Secondary | ICD-10-CM | POA: Diagnosis not present

## 2018-07-30 DIAGNOSIS — R21 Rash and other nonspecific skin eruption: Secondary | ICD-10-CM | POA: Diagnosis not present

## 2018-07-30 DIAGNOSIS — B351 Tinea unguium: Secondary | ICD-10-CM | POA: Diagnosis not present

## 2018-07-30 DIAGNOSIS — Q613 Polycystic kidney, unspecified: Secondary | ICD-10-CM

## 2018-07-30 DIAGNOSIS — N2889 Other specified disorders of kidney and ureter: Secondary | ICD-10-CM

## 2018-07-30 LAB — COMPLETE METABOLIC PANEL WITH GFR
AG Ratio: 1.7 (calc) (ref 1.0–2.5)
ALT: 21 U/L (ref 9–46)
AST: 15 U/L (ref 10–40)
Albumin: 4.3 g/dL (ref 3.6–5.1)
Alkaline phosphatase (APISO): 82 U/L (ref 36–130)
BUN: 21 mg/dL (ref 7–25)
CO2: 29 mmol/L (ref 20–32)
Calcium: 10.1 mg/dL (ref 8.6–10.3)
Chloride: 103 mmol/L (ref 98–110)
Creat: 0.93 mg/dL (ref 0.60–1.35)
GFR, Est African American: 111 mL/min/{1.73_m2} (ref >=60)
GFR, Est Non African American: 96 mL/min/{1.73_m2} (ref >=60)
Globulin: 2.6 g/dL (calc) (ref 1.9–3.7)
Glucose, Bld: 102 mg/dL — ABNORMAL HIGH (ref 65–99)
Potassium: 3.5 mmol/L (ref 3.5–5.3)
Sodium: 140 mmol/L (ref 135–146)
Total Bilirubin: 0.4 mg/dL (ref 0.2–1.2)
Total Protein: 6.9 g/dL (ref 6.1–8.1)

## 2018-07-30 MED ORDER — EFINACONAZOLE 10 % EX SOLN: 1.0000 "application " | Freq: Every day | CUTANEOUS | 1 refills | Status: DC

## 2018-07-30 NOTE — Progress Notes (Signed)
Name: David [name]   MRN: [id number]    DOB: 1971/03/08   Date:07/30/2018       Progress Note  Subjective  Chief Complaint  Chief Complaint  Patient presents with  . Follow-up    rash worse  . Hypertension    HPI  Hypertension was taking amlodipine 10mg , HCTZ 25mg , losartan 100mg  daily and noted elevated readings. Virtual visit 3 weeks ago added on metoprolol 25mg  to regimen, denies issues with this medication has been working well for him.  BP Readings from Last 3 Encounters:  07/30/18 134/68  07/03/17 (!) 144/82  05/29/17 (!) 142/74     Patient endorses rash started 1-2 years ago on his back, states has been spreading slowly to front of chest and then in the last few months has progressed onto bilateral arms. Red, dry scaly patches has large blanchable areas on back, smaller scaly patches on bilateral and legs. Was told to wash in a type of dandruff shampoo per patient by PCP but states this has not been helping. Worse with hot showers and when he is in the heat for a long time. Area is not itchy or painful. Denies fevers, chills, nausea, vomiting.   Has been using antifungal creams on toes for bilateral feet first and last digit toe nail. Is hard and thick. Soaks in it every night, without relief.   Patients right foot big toe and ankle had swelling, redness pain- likely gout flare, has resolved with prednisone taper.   Ate this morning- sausage gravy and scrambled eggs.  PHQ2/9: Depression screen [facility] 2/9 07/30/2018 07/12/2018 07/03/2017 05/29/2017 04/27/2017  Decreased Interest 0 0 0 0 0  Down, Depressed, Hopeless 0 0 0 0 0  PHQ - 2 Score 0 0 0 0 0  Altered sleeping 0 0 - - -  Tired, decreased energy 0 0 - - -  Change in appetite 0 0 - - -  Feeling bad or failure about yourself  0 0 - - -  Trouble concentrating 0 0 - - -  Moving slowly or fidgety/restless 0 0 - - -  Suicidal thoughts 0 0 - - -  PHQ-9 Score 0 0 - - -  Difficult doing work/chores Not difficult at all Not  difficult at all - - -     PHQ reviewed. Negative  Patient Active Problem List   Diagnosis Date Noted  . Polycystic kidney disease 05/29/2017  . Chronic lower back pain 06/22/2015  . Elevated liver enzymes 05/31/2015  . Abdominal pain, right upper quadrant 05/08/2015  . Medication monitoring encounter 03/09/2015  . Tinea versicolor 03/09/2015  . Overweight (BMI 25.0-29.9) 03/09/2015  . Low HDL (under 40) 11/04/2014  . Gout   . Gynecomastia, male   . Eczema   . Hyperlipidemia   . Hypertension   . Snoring     Past Medical History:  Diagnosis Date  . Eczema   . Gout   . Gynecomastia, male left  . History of pyelonephritis Jan 2017  . Hyperlipidemia   . Hypertension   . Polycystic kidney disease   . Snoring     Past Surgical History:  Procedure Laterality Date  . NO PAST SURGERIES      Social History   Tobacco Use  . Smoking status: Never Smoker  . Smokeless tobacco: Former Systems developer    Types: Chew  Substance Use Topics  . Alcohol use: No     Current Outpatient Medications:  .  amLODipine (NORVASC) 10 MG tablet, Take  1 tablet (10 mg total) by mouth daily., Disp: 90 tablet, Rfl: 1 .  hydrochlorothiazide (HYDRODIURIL) 25 MG tablet, Take 1 tablet (25 mg total) by mouth daily., Disp: 90 tablet, Rfl: 1 .  losartan (COZAAR) 100 MG tablet, Take 1 tablet (100 mg total) by mouth daily., Disp: 90 tablet, Rfl: 1 .  metoprolol succinate (TOPROL-XL) 25 MG 24 hr tablet, Take 0.5 tablets (12.5 mg total) by mouth daily., Disp: 30 tablet, Rfl: 0 .  potassium chloride (KLOR-CON 10) 10 MEQ tablet, Take 1 tablet (10 mEq total) by mouth 2 (two) times daily., Disp: 4 tablet, Rfl: 0 .  predniSONE (STERAPRED UNI-PAK 21 TAB) 10 MG (21) TBPK tablet, Take as directed., Disp: 21 tablet, Rfl: 0  No Known Allergies  ROS    No other specific complaints in a complete review of systems (except as listed in HPI above).  Objective  Vitals:   07/30/18 0956  BP: 134/68  Pulse: 86  Resp:  16  Temp: 98.9 F (37.2 C)  TempSrc: Oral  SpO2: 98%  Weight: 232 lb (105.2 kg)  Height: 6\' 2"  (1.88 m)     Body mass index is 29.79 kg/m.  Nursing Note and Vital Signs reviewed.  Physical Exam Constitutional:      Appearance: Normal appearance. He is well-developed.  HENT:     Head: Normocephalic and atraumatic.     Right Ear: Hearing normal.     Left Ear: Hearing normal.  Eyes:     Conjunctiva/sclera: Conjunctivae normal.  Cardiovascular:     Rate and Rhythm: Normal rate.  Pulmonary:     Effort: Pulmonary effort is normal.  Musculoskeletal: Normal range of motion.  Skin:    General: Skin is warm and dry.     Findings: Rash present. No bruising.       Neurological:     Mental Status: He is alert and oriented to person, place, and time.  Psychiatric:        Speech: Speech normal.        Behavior: Behavior normal. Behavior is cooperative.        Thought Content: Thought content normal.        Judgment: Judgment normal.        No results found for this or any previous visit (from the past 48 hour(s)).  Assessment & Plan 1. Hypertension secondary to other renal disorders Continue current regiment, BP well controlled, HR stable  - COMPLETE METABOLIC PANEL WITH GFR  2. Polycystic kidney disease Continue follow up with nephrologist avoid NSAIDs - COMPLETE METABOLIC PANEL WITH GFR  3. Onychomycosis - Ambulatory referral to Dermatology - Ambulatory referral to Podiatry - Efinaconazole 10 % SOLN; Apply 1 application topically daily.  Dispense: 1 Bottle; Refill: 1  4. Rash and nonspecific skin eruption Discussed various possibilities due to dry area recommend emollient immediately after showers, avoid hot and frequent showers.  - Ambulatory referral to Dermatology

## 2018-07-30 NOTE — Patient Instructions (Addendum)
- If you do not hear about your referrals to dermatologist or podiatrist within one month please let us know - You should hear back about labs within a week, call us if you do not.  - Continue to take blood pressure medications as prescribed, work on low salt diet, plenty of healthy fruits and vegetables and cut down on red meats in diet.  - Drink plenty of water, if you would like a referral to pain management or orthopedic surgery for your chronic back pain in the future please let us know.  - Can try prescription strength topical ointment for affected toenails until you meet with podiatrist or dermatologist. solution is applied directly to the nails once daily for 48 weeks. One drop is applied to the surface of each affected nail; for the great toenail, an additional drop should be applied at the end of the toenail. The application brush is used to spread the solution to the toenail bed, adjacent nail folds, hyponychium, and undersurface of the nail plate - Avoid frequent showers, and hot showers. While skin is still moist apply emollient lotion, apply as often as needed.   Fungal Nail Infection A fungal nail infection is a common infection of the toenails or fingernails. This condition affects toenails more often than fingernails. It often affects the great, or big, toes. More than one nail may be infected. The condition can be passed from person to person (is contagious). What are the causes? This condition is caused by a fungus. Several types of fungi can cause the infection. These fungi are common in moist and warm areas. If your hands or feet come into contact with the fungus, it may get into a crack in your fingernail or toenail and cause the infection. What increases the risk? The following factors may make you more likely to develop this condition:  Being male.  Being of older age.  Living with someone who has the fungus.  Walking barefoot in areas where the fungus thrives, such as  showers or locker rooms.  Wearing shoes and socks that cause your feet to sweat.  Having a nail injury or a recent nail surgery.  Having certain medical conditions, such as: ? Athlete's foot. ? Diabetes. ? Psoriasis. ? Poor circulation. ? A weak body defense system (immune system). What are the signs or symptoms? Symptoms of this condition include:  A pale spot on the nail.  Thickening of the nail.  A nail that becomes yellow or brown.  A brittle or ragged nail edge.  A crumbling nail.  A nail that has lifted away from the nail bed. How is this diagnosed? This condition is diagnosed with a physical exam. Your health care provider may take a scraping or clipping from your nail to test for the fungus. How is this treated? Treatment is not needed for mild infections. If you have significant nail changes, treatment may include:  Antifungal medicines taken by mouth (orally). You may need to take the medicine for several weeks or several months, and you may not see the results for a long time. These medicines can cause side effects. Ask your health care provider what problems to watch for.  Antifungal nail polish or nail cream. These may be used along with oral antifungal medicines.  Laser treatment of the nail.  Surgery to remove the nail. This may be needed for the most severe infections. It can take a long time, usually up to a year, for the infection to go away. The  infection may also come back. Follow these instructions at home: Medicines  Take or apply over-the-counter and prescription medicines only as told by your health care provider.  Ask your health care provider about using over-the-counter mentholated ointment on your nails. Nail care  Trim your nails often.  Wash and dry your hands and feet every day.  Keep your feet dry: ? Wear absorbent socks, and change your socks frequently. ? Wear shoes that allow air to circulate, such as sandals or canvas tennis  shoes. Throw out old shoes.  Do not use artificial nails.  If you go to a nail salon, make sure you choose one that uses clean instruments.  Use antifungal foot powder on your feet and in your shoes. General instructions  Do not share personal items, such as towels or nail clippers.  Do not walk barefoot in shower rooms or locker rooms.  Wear rubber gloves if you are working with your hands in wet areas.  Keep all follow-up visits as told by your health care provider. This is important. Contact a health care provider if: Your infection is not getting better or it is getting worse after several months. Summary  A fungal nail infection is a common infection of the toenails or fingernails.  Treatment is not needed for mild infections. If you have significant nail changes, treatment may include taking medicine orally and applying medicine to your nails.  It can take a long time, usually up to a year, for the infection to go away. The infection may also come back.  Take or apply over-the-counter and prescription medicines only as told by your health care provider.  Follow instructions for taking care of your nails to help prevent infection from coming back or spreading. This information is not intended to replace advice given to you by your health care provider. Make sure you discuss any questions you have with your health care provider. Document Released: 04/01/2000 Document Revised: 09/08/2017 Document Reviewed: 09/08/2017 Elsevier Interactive Patient Education  2019 [employer].

## 2018-08-09 ENCOUNTER — Other Ambulatory Visit: Payer: Self-pay | Admitting: Nurse Practitioner

## 2018-08-09 DIAGNOSIS — I151 Hypertension secondary to other renal disorders: Secondary | ICD-10-CM

## 2018-08-09 DIAGNOSIS — N2889 Other specified disorders of kidney and ureter: Principal | ICD-10-CM

## 2018-10-29 ENCOUNTER — Ambulatory Visit: Payer: Managed Care, Other (non HMO) | Admitting: Family Medicine

## 2019-01-02 ENCOUNTER — Telehealth: Payer: Self-pay

## 2019-01-02 DIAGNOSIS — I151 Hypertension secondary to other renal disorders: Secondary | ICD-10-CM

## 2019-01-02 DIAGNOSIS — N2889 Other specified disorders of kidney and ureter: Secondary | ICD-10-CM

## 2019-01-04 MED ORDER — LOSARTAN POTASSIUM 100 MG PO TABS: 100.0000 mg | ORAL_TABLET | Freq: Every day | ORAL | 0 refills | Status: DC

## 2019-01-04 MED ORDER — AMLODIPINE BESYLATE 10 MG PO TABS: 10.0000 mg | ORAL_TABLET | Freq: Every day | ORAL | 0 refills | Status: DC

## 2019-01-07 NOTE — Telephone Encounter (Signed)
lvm for scheduling °

## 2019-01-10 ENCOUNTER — Other Ambulatory Visit: Payer: Self-pay

## 2019-01-10 ENCOUNTER — Ambulatory Visit (INDEPENDENT_AMBULATORY_CARE_PROVIDER_SITE_OTHER): Payer: Managed Care, Other (non HMO) | Admitting: Family Medicine

## 2019-01-10 ENCOUNTER — Encounter: Payer: Self-pay | Admitting: Family Medicine

## 2019-01-10 DIAGNOSIS — E782 Mixed hyperlipidemia: Secondary | ICD-10-CM | POA: Diagnosis not present

## 2019-01-10 DIAGNOSIS — R5383 Other fatigue: Secondary | ICD-10-CM | POA: Diagnosis not present

## 2019-01-10 DIAGNOSIS — Q613 Polycystic kidney, unspecified: Secondary | ICD-10-CM

## 2019-01-10 DIAGNOSIS — I151 Hypertension secondary to other renal disorders: Secondary | ICD-10-CM

## 2019-01-10 DIAGNOSIS — N2889 Other specified disorders of kidney and ureter: Secondary | ICD-10-CM

## 2019-01-10 DIAGNOSIS — R739 Hyperglycemia, unspecified: Secondary | ICD-10-CM

## 2019-01-10 NOTE — Progress Notes (Signed)
Name: David [name]   MRN: [id number]    DOB: 1970/09/04   Date:01/10/2019       Progress Note  Subjective:    Chief Complaint  Chief Complaint  Patient presents with  . Follow-up    I connected with  David [name]  on 01/10/19 at  3:40 PM EDT by a video enabled telemedicine application and verified that I am speaking with the correct person using two identifiers.  I discussed the limitations of evaluation and management by telemedicine and the availability of in person appointments. The patient expressed understanding and agreed to proceed. Staff also discussed with the patient that there may be a patient responsible charge related to this service. Patient Location: at work in his office Provider Location: [facility] in my office Additional Individuals present: none  HPI  Here for follow up on chronic conditions for med refills and is due for labs  Hypertension:  Pt diagnosed with HTN 5+ years ago Currently managed on Metoprolol XL 25 mg daily, losartan 100 mg daily, HCTZ 25mg  daily , norvasc 10 mg daily, takes all in the am Pt reports good med compliance and denies any SE.  Occasionally checks BP- has not checked it in a while, no way to check it right now. He denies lightheadedness, hypotension, syncope, CP, SOB, exertional sx, LE edema, palpitation, Ha's, visual disturbances Last BP's in office - last was well controlled BP Readings from Last 3 Encounters:  07/30/18 134/68  07/03/17 (!) 144/82  05/29/17 (!) 142/74   Dietary efforts for BP?  no Feet will swell from time to time, but no swelling to legs, no weight changes  Hyperlipidemia: Pt denies hx of HLD or any past tx, per chart review he's had mildly elevated LDL in the past and low HDL, not on meds, no particular diet. Lab Results  Component Value Date   CHOL 161 03/09/2015   HDL 39 (L) 03/09/2015   LDLCALC 96 03/09/2015   TRIG 131 03/09/2015  - Documented aortic atherosclerosis? No - Risk factors for  atherosclerosis: hypertension   Polycystic kidney disease: See nephrology - Dr. Juleen China - Hasn't seen in a while, but he has him on potassium, however he does not know what does and its not in his chart.  No urinary changes (normal urine output) no swelling or weight changes. He denies any muscle cramps.  Fatigue -  Ongoing for a few years (2017 worked up some) He feels tired mostly during the day, doesn't happen every day, comes and goes.  He says "they've tested everything" per pt - neg sleep study, no thyroid disease, DM, low testosterone.  He is on metoprolol - doesn't know how long he's been on it, but he doesn't think his fatigue is correlated with that medicine.  He denies exertional dyspnea/CP, denies near syncope.  Says, "I'm just old" He feels like his mood is good, doesn't feel like fatigue is depression at all.     Patient Active Problem List   Diagnosis Date Noted  . Polycystic kidney disease 05/29/2017  . Chronic lower back pain 06/22/2015  . Elevated liver enzymes 05/31/2015  . Abdominal pain, right upper quadrant 05/08/2015  . Medication monitoring encounter 03/09/2015  . Tinea versicolor 03/09/2015  . Overweight (BMI 25.0-29.9) 03/09/2015  . Low HDL (under 40) 11/04/2014  . Gout   . Gynecomastia, male   . Eczema   . Hyperlipidemia   . Hypertension   . Snoring     Past Surgical  History:  Procedure Laterality Date  . NO PAST SURGERIES      Family History  Problem Relation Age of Onset  . Hypertension Mother   . Kidney disease Father   . Hypertension Father   . Dementia Father   . Squamous cell carcinoma Father   . Cancer Father        SCC  . Diabetes Father   . Heart disease Father   . Cancer Brother        hodgkin's lymphoma  . Heart disease Brother 47       3 vessel CABG  . Heart disease Maternal Grandmother   . Stroke Maternal Grandmother   . Heart disease Maternal Grandfather   . Stroke Maternal Grandfather   . Heart disease Paternal Grandmother    . Heart disease Paternal Grandfather     Social History   Socioeconomic History  . Marital status: Married    Spouse name: Not on file  . Number of children: Not on file  . Years of education: Not on file  . Highest education level: Not on file  Occupational History  . Not on file  Social Needs  . Financial resource strain: Not on file  . Food insecurity    Worry: Not on file    Inability: Not on file  . Transportation needs    Medical: Not on file    Non-medical: Not on file  Tobacco Use  . Smoking status: Never Smoker  . Smokeless tobacco: Former Systems developer    Types: Chew  Substance and Sexual Activity  . Alcohol use: No  . Drug use: No  . Sexual activity: Yes  Lifestyle  . Physical activity    Days per week: Not on file    Minutes per session: Not on file  . Stress: Not on file  Relationships  . Social Herbalist on phone: Not on file    Gets together: Not on file    Attends religious service: Not on file    Active member of club or organization: Not on file    Attends meetings of clubs or organizations: Not on file    Relationship status: Not on file  . Intimate partner violence    Fear of current or ex partner: Not on file    Emotionally abused: Not on file    Physically abused: Not on file    Forced sexual activity: Not on file  Other Topics Concern  . Not on file  Social History Narrative  . Not on file     Current Outpatient Medications:  .  amLODipine (NORVASC) 10 MG tablet, Take 1 tablet (10 mg total) by mouth daily., Disp: 30 tablet, Rfl: 0 .  Efinaconazole 10 % SOLN, Apply 1 application topically daily., Disp: 1 Bottle, Rfl: 1 .  hydrochlorothiazide (HYDRODIURIL) 25 MG tablet, Take 1 tablet (25 mg total) by mouth daily., Disp: 90 tablet, Rfl: 1 .  losartan (COZAAR) 100 MG tablet, Take 1 tablet (100 mg total) by mouth daily., Disp: 30 tablet, Rfl: 0 .  metoprolol succinate (TOPROL-XL) 25 MG 24 hr tablet, Take 1/2 (one-half) tablet by mouth  once daily, Disp: 45 tablet, Rfl: 0 .  potassium chloride (KLOR-CON 10) 10 MEQ tablet, Take 1 tablet (10 mEq total) by mouth 2 (two) times daily., Disp: 4 tablet, Rfl: 0 .  predniSONE (STERAPRED UNI-PAK 21 TAB) 10 MG (21) TBPK tablet, Take as directed. (Patient not taking: Reported on 01/10/2019), Disp: 21 tablet, Rfl:  0  No Known Allergies  I personally reviewed active problem list, medication list, allergies, family history, social history, health maintenance, notes from last encounter, lab results, imaging with the patient/caregiver today.  Review of Systems  Constitutional: Negative.   HENT: Negative.   Eyes: Negative.   Respiratory: Negative.   Cardiovascular: Negative.   Gastrointestinal: Negative.   Endocrine: Negative.   Genitourinary: Negative.   Musculoskeletal: Negative.   Skin: Negative.   Allergic/Immunologic: Negative.   Neurological: Negative.   Hematological: Negative.   Psychiatric/Behavioral: Negative.   All other systems reviewed and are negative.     Objective:    Virtual encounter, vitals limited, only able to obtain the following There were no vitals filed for this visit. There is no height or weight on file to calculate BMI. Nursing Note and Vital Signs reviewed.  Physical Exam Vitals signs and nursing note reviewed.  Constitutional:      Appearance: Normal appearance. He is well-developed.     Comments: Well appearing male, looks stated age, NAD  HENT:     Head: Normocephalic and atraumatic.     Right Ear: External ear normal.     Left Ear: External ear normal.     Nose: Nose normal.  Eyes:     General: No scleral icterus.       Right eye: No discharge.        Left eye: No discharge.     Conjunctiva/sclera: Conjunctivae normal.  Neck:     Trachea: No tracheal deviation.  Pulmonary:     Effort: Pulmonary effort is normal. No respiratory distress.     Breath sounds: No stridor.  Musculoskeletal: Normal range of motion.  Skin:     Coloration: Skin is not jaundiced or pale.     Findings: No rash.  Neurological:     Mental Status: He is alert.     Motor: No weakness or abnormal muscle tone.     Coordination: Coordination normal.  Psychiatric:        Mood and Affect: Mood normal.        Behavior: Behavior normal.     PE limited by telephone encounter  No results found for this or any previous visit (from the past 72 hour(s)).  PHQ2/9: Depression screen [facility] 2/9 01/10/2019 07/30/2018 07/12/2018 07/03/2017 05/29/2017  Decreased Interest 0 0 0 0 0  Down, Depressed, Hopeless 0 0 0 0 0  PHQ - 2 Score 0 0 0 0 0  Altered sleeping 0 0 0 - -  Tired, decreased energy 0 0 0 - -  Change in appetite 0 0 0 - -  Feeling bad or failure about yourself  0 0 0 - -  Trouble concentrating 0 0 0 - -  Moving slowly or fidgety/restless 0 0 0 - -  Suicidal thoughts 0 0 0 - -  PHQ-9 Score 0 0 0 - -  Difficult doing work/chores Not difficult at all Not difficult at all Not difficult at all - -   PHQ-2/9 Result is negative.    Fall Risk: Fall Risk  01/10/2019 07/30/2018 07/12/2018 07/03/2017 05/29/2017  Falls in the past year? 0 0 0 No No  Number falls in past yr: 0 0 - - -  Injury with Fall? 0 0 - - -  Follow up Falls evaluation completed - - - -     Assessment and Plan:   KIERIAN HOGGAN is a 48 year old male presents for routine follow-up on chronic conditions, has not been  seen in office in a while since earlier this year, due for labs.  1. Hypertension secondary to other renal disorders Compliant with medications no concerning side effects, due for labs and have requested that he get vital signs while he is here to see if his blood pressure is at goal. - COMPLETE METABOLIC PANEL WITH GFR  2. Mixed hyperlipidemia History of HLD in chart but not on any medications no labs for 4 years recheck lipid panel - COMPLETE METABOLIC PANEL WITH GFR - Lipid panel - Hemoglobin A1c - TSH  3. Polycystic kidney disease Managed per  Nephrology - [place], no recent visits or records, past records and labs that are available in the chart were reviewed today.  Patient has no signs or symptoms of worsening kidney function will recheck labs.  He is on potassium daily but he does not know the dose will need to call pharmacy to confirm what he is on - CBC with Differential/Platelet - COMPLETE METABOLIC PANEL WITH GFR - Lipid panel - TSH - VITAMIN D 25 Hydroxy (Vit-D Deficiency, Fractures) - urine microalbumin  4. Fatigue, unspecified type Patient endorses fatigue for several years that has been worked up without any positive findings to suggest the etiology.   Will rule out hypothyroid, diabetes, anemia, other deficiencies.  Could possibly medication side effect to metoprolol but he is not sure about when symptoms started and how long has been on his medications.  He does not have any concerning cardiac or pulmonary symptoms.   - CBC with Differential/Platelet - COMPLETE METABOLIC PANEL WITH GFR - Hemoglobin A1c - TSH - VITAMIN D 25 Hydroxy (Vit-D Deficiency, Fractures)  5. Hyperglycemia several elevated blood sugars in chart over past several years, no A1C done, r/o DM - COMPLETE METABOLIC PANEL WITH GFR - Hemoglobin A1c   I discussed the assessment and treatment plan with the patient. The patient was provided an opportunity to ask questions and all were answered. The patient agreed with the plan and demonstrated an understanding of the instructions.  The patient was advised to call back or seek an in-person evaluation if the symptoms worsen or if the condition fails to improve as anticipated.  I provided 20 minutes of non-face-to-face time during this encounter.  Delsa Grana, PA-C 09/24/203:37 PM

## 2019-01-12 LAB — HEMOGLOBIN A1C
Hgb A1c MFr Bld: 5.1 % of total Hgb (ref <5.7)
Mean Plasma Glucose: 100 (calc)
eAG (mmol/L): 5.5 (calc)

## 2019-01-12 LAB — COMPLETE METABOLIC PANEL WITH GFR
AG Ratio: 2 (calc) (ref 1.0–2.5)
ALT: 11 U/L (ref 9–46)
AST: 17 U/L (ref 10–35)
Albumin: 4.4 g/dL (ref 3.6–5.1)
Alkaline phosphatase (APISO): 67 U/L (ref 35–144)
BUN: 17 mg/dL (ref 7–25)
CO2: 30 mmol/L (ref 20–32)
Calcium: 9.8 mg/dL (ref 8.6–10.3)
Chloride: 103 mmol/L (ref 98–110)
Creat: 0.96 mg/dL (ref 0.70–1.33)
GFR, Est African American: 106 mL/min/{1.73_m2} (ref >=60)
GFR, Est Non African American: 92 mL/min/{1.73_m2} (ref >=60)
Globulin: 2.2 g/dL (calc) (ref 1.9–3.7)
Glucose, Bld: 108 mg/dL — ABNORMAL HIGH (ref 65–99)
Potassium: 3.7 mmol/L (ref 3.5–5.3)
Sodium: 144 mmol/L (ref 135–146)
Total Bilirubin: 0.3 mg/dL (ref 0.2–1.2)
Total Protein: 6.6 g/dL (ref 6.1–8.1)

## 2019-01-12 LAB — CBC WITH DIFFERENTIAL/PLATELET
Absolute Monocytes: 766 cells/uL (ref 200–950)
Basophils Absolute: 40 cells/uL (ref 0–200)
Basophils Relative: 0.5 %
Eosinophils Absolute: 182 cells/uL (ref 15–500)
Eosinophils Relative: 2.3 %
HCT: 40.2 % (ref 38.5–50.0)
Hemoglobin: 13.6 g/dL (ref 13.2–17.1)
Lymphs Abs: 1809 cells/uL (ref 850–3900)
MCH: 29.3 pg (ref 27.0–33.0)
MCHC: 33.8 g/dL (ref 32.0–36.0)
MCV: 86.6 fL (ref 80.0–100.0)
MPV: 11.6 fL (ref 7.5–12.5)
Monocytes Relative: 9.7 %
Neutro Abs: 5103 cells/uL (ref 1500–7800)
Neutrophils Relative %: 64.6 %
Platelets: 221 10*3/uL (ref 140–400)
RBC: 4.64 10*6/uL (ref 4.20–5.80)
RDW: 13 % (ref 11.0–15.0)
Total Lymphocyte: 22.9 %
WBC: 7.9 10*3/uL (ref 3.8–10.8)

## 2019-01-12 LAB — LIPID PANEL
Cholesterol: 158 mg/dL (ref <200)
HDL: 39 mg/dL — ABNORMAL LOW (ref >=40)
LDL Cholesterol (Calc): 87 mg/dL (calc)
Non-HDL Cholesterol (Calc): 119 mg/dL (calc) (ref <130)
Total CHOL/HDL Ratio: 4.1 (calc) (ref <5.0)
Triglycerides: 228 mg/dL — ABNORMAL HIGH (ref <150)

## 2019-01-12 LAB — TSH: TSH: 0.69 mIU/L (ref 0.40–4.50)

## 2019-01-12 LAB — VITAMIN D 25 HYDROXY (VIT D DEFICIENCY, FRACTURES): Vit D, 25-Hydroxy: 27 ng/mL — ABNORMAL LOW (ref 30–100)

## 2019-01-12 LAB — MICROALBUMIN, URINE: Microalb, Ur: 0.4 mg/dL

## 2019-01-21 ENCOUNTER — Other Ambulatory Visit: Payer: Self-pay

## 2019-01-21 ENCOUNTER — Ambulatory Visit (INDEPENDENT_AMBULATORY_CARE_PROVIDER_SITE_OTHER): Payer: Managed Care, Other (non HMO)

## 2019-01-21 DIAGNOSIS — Z23 Encounter for immunization: Secondary | ICD-10-CM | POA: Diagnosis not present

## 2019-02-01 ENCOUNTER — Other Ambulatory Visit: Payer: Self-pay

## 2019-02-01 DIAGNOSIS — I151 Hypertension secondary to other renal disorders: Secondary | ICD-10-CM

## 2019-02-01 DIAGNOSIS — N2889 Other specified disorders of kidney and ureter: Secondary | ICD-10-CM

## 2019-02-01 MED ORDER — HYDROCHLOROTHIAZIDE 25 MG PO TABS: 25.0000 mg | ORAL_TABLET | Freq: Every day | ORAL | 1 refills | Status: DC

## 2019-03-04 ENCOUNTER — Other Ambulatory Visit: Payer: Self-pay | Admitting: Family Medicine

## 2019-03-04 DIAGNOSIS — I151 Hypertension secondary to other renal disorders: Secondary | ICD-10-CM

## 2019-03-29 ENCOUNTER — Other Ambulatory Visit: Payer: Self-pay

## 2019-03-29 DIAGNOSIS — I151 Hypertension secondary to other renal disorders: Secondary | ICD-10-CM

## 2019-03-29 DIAGNOSIS — N2889 Other specified disorders of kidney and ureter: Secondary | ICD-10-CM

## 2019-03-29 MED ORDER — METOPROLOL SUCCINATE ER 25 MG PO TB24: ORAL_TABLET | ORAL | 0 refills | Status: DC

## 2019-07-01 ENCOUNTER — Other Ambulatory Visit: Payer: Self-pay

## 2019-07-01 DIAGNOSIS — N2889 Other specified disorders of kidney and ureter: Secondary | ICD-10-CM

## 2019-07-01 DIAGNOSIS — I151 Hypertension secondary to other renal disorders: Secondary | ICD-10-CM

## 2019-07-01 NOTE — Telephone Encounter (Signed)
Hypertension medication request:  Last office visit pertaining to hypertension:9/24  BP Readings from Last 3 Encounters:  07/30/18 134/68  07/03/17 (!) 144/82  05/29/17 (!) 142/74    Lab Results  Component Value Date   CREATININE 0.96 01/11/2019   BUN 17 01/11/2019   NA 144 01/11/2019   K 3.7 01/11/2019   CL 103 01/11/2019   CO2 30 01/11/2019     No follow-ups on file.

## 2019-07-02 MED ORDER — METOPROLOL SUCCINATE ER 25 MG PO TB24: ORAL_TABLET | ORAL | 0 refills | Status: DC

## 2019-07-03 ENCOUNTER — Other Ambulatory Visit: Payer: Self-pay

## 2019-07-03 ENCOUNTER — Ambulatory Visit: Payer: BC Managed Care – PPO | Admitting: Family Medicine

## 2019-07-03 ENCOUNTER — Encounter: Payer: Self-pay | Admitting: Family Medicine

## 2019-07-03 VITALS — BP 120/80 | HR 83 | Temp 97.9°F | Resp 14 | Ht 74.0 in | Wt 204.0 lb

## 2019-07-03 DIAGNOSIS — N2889 Other specified disorders of kidney and ureter: Secondary | ICD-10-CM

## 2019-07-03 DIAGNOSIS — E782 Mixed hyperlipidemia: Secondary | ICD-10-CM | POA: Diagnosis not present

## 2019-07-03 DIAGNOSIS — I151 Hypertension secondary to other renal disorders: Secondary | ICD-10-CM

## 2019-07-03 DIAGNOSIS — L918 Other hypertrophic disorders of the skin: Secondary | ICD-10-CM

## 2019-07-03 DIAGNOSIS — N529 Male erectile dysfunction, unspecified: Secondary | ICD-10-CM

## 2019-07-03 NOTE — Patient Instructions (Signed)
Return when you can in the early morning hours for blood work testosterone between 8 and 9 am    Erectile Dysfunction Erectile dysfunction (ED) is the inability to get or keep an erection in order to have sexual intercourse. Erectile dysfunction may include:  Inability to get an erection.  Lack of enough hardness of the erection to allow penetration.  Loss of the erection before sex is finished. What are the causes? This condition may be caused by:  Certain medicines, such as: ? Pain relievers. ? Antihistamines. ? Antidepressants. ? Blood pressure medicines. ? Water pills (diuretics). ? Ulcer medicines. ? Muscle relaxants. ? Drugs.  Excessive drinking.  Psychological causes, such as: ? Anxiety. ? Depression. ? Sadness. ? Exhaustion. ? Performance fear. ? Stress.  Physical causes, such as: ? Artery problems. This may include diabetes, smoking, liver disease, or atherosclerosis. ? High blood pressure. ? Hormonal problems, such as low testosterone. ? Obesity. ? Nerve problems. This may include back or pelvic injuries, diabetes mellitus, multiple sclerosis, or Parkinson disease. What are the signs or symptoms? Symptoms of this condition include:  Inability to get an erection.  Lack of enough hardness of the erection to allow penetration.  Loss of the erection before sex is finished.  Normal erections at some times, but with frequent unsatisfactory episodes.  Low sexual satisfaction in either partner due to erection problems.  A curved penis occurring with erection. The curve may cause pain or the penis may be too curved to allow for intercourse.  Never having nighttime erections. How is this diagnosed? This condition is often diagnosed by:  Performing a physical exam to find other diseases or specific problems with the penis.  Asking you detailed questions about the problem.  Performing blood tests to check for diabetes mellitus or to measure hormone  levels.  Performing other tests to check for underlying health conditions.  Performing an ultrasound exam to check for scarring.  Performing a test to check blood flow to the penis.  Doing a sleep study at home to measure nighttime erections. How is this treated? This condition may be treated by:  Medicine taken by mouth to help you achieve an erection (oral medicine).  Hormone replacement therapy to replace low testosterone levels.  Medicine that is injected into the penis. Your health care provider may instruct you how to give yourself these injections at home.  Vacuum pump. This is a pump with a ring on it. The pump and ring are placed on the penis and used to create pressure that helps the penis become erect.  Penile implant surgery. In this procedure, you may receive: ? An inflatable implant. This consists of cylinders, a pump, and a reservoir. The cylinders can be inflated with a fluid that helps to create an erection, and they can be deflated after intercourse. ? A semi-rigid implant. This consists of two silicone rubber rods. The rods provide some rigidity. They are also flexible, so the penis can both curve downward in its normal position and become straight for sexual intercourse.  Blood vessel surgery, to improve blood flow to the penis. During this procedure, a blood vessel from a different part of the body is placed into the penis to allow blood to flow around (bypass) damaged or blocked blood vessels.  Lifestyle changes, such as exercising more, losing weight, and quitting smoking. Follow these instructions at home: Medicines   Take over-the-counter and prescription medicines only as told by your health care provider. Do not increase the dosage  without first discussing it with your health care provider.  If you are using self-injections, perform injections as directed by your health care provider. Make sure to avoid any veins that are on the surface of the penis. After  giving an injection, apply pressure to the injection site for 5 minutes. General instructions  Exercise regularly, as directed by your health care provider. Work with your health care provider to lose weight, if needed.  Do not use any products that contain nicotine or tobacco, such as cigarettes and e-cigarettes. If you need help quitting, ask your health care provider.  Before using a vacuum pump, read the instructions that come with the pump and discuss any questions with your health care provider.  Keep all follow-up visits as told by your health care provider. This is important. Contact a health care provider if:  You feel nauseous.  You vomit. Get help right away if:  You are taking oral or injectable medicines and you have an erection that lasts longer than 4 hours. If your health care provider is unavailable, go to the nearest emergency room for evaluation. An erection that lasts much longer than 4 hours can result in permanent damage to your penis.  You have severe pain in your groin or abdomen.  You develop redness or severe swelling of your penis.  You have redness spreading up into your groin or lower abdomen.  You are unable to urinate.  You experience chest pain or a rapid heart beat (palpitations) after taking oral medicines. Summary  Erectile dysfunction (ED) is the inability to get or keep an erection during sexual intercourse. This problem can usually be treated successfully.  This condition is diagnosed based on a physical exam, your symptoms, and tests to determine the cause. Treatment varies depending on the cause, and may include medicines, hormone therapy, surgery, or vacuum pump.  You may need follow-up visits to make sure that you are using your medicines or devices correctly.  Get help right away if you are taking or injecting medicines and you have an erection that lasts longer than 4 hours. This information is not intended to replace advice given to  you by your health care provider. Make sure you discuss any questions you have with your health care provider. Document Revised: 03/17/2017 Document Reviewed: 04/20/2016 Elsevier Patient Education  [employer].

## 2019-07-03 NOTE — Progress Notes (Signed)
Patient ID: David [name], male    DOB: 06/08/1970, 49 y.o.   MRN: [id number]  PCP: Delsa Grana, PA-C  Chief Complaint  Patient presents with  . Erectile Dysfunction  . Skin Tag    back of left leg     Subjective:   David [name] is a 49 y.o. male, presents to clinic with CC of erectile dysfunction onset in the past year  Patient states that he has gradually been dealing with ED He reports decreased sex drive, not able to sustain a full erection, can't achieve penetration, for the past 4-5 months.  no changes that he notes in the past year, denies any med changes, significant life changes, he denies any history of peripheral vascular disease, diabetes, he is not on any psychiatric medications.  He does note that he got divorced couple years ago, but that was a good thing He is dating someone new and has not been able to have sex with her due to ED that he does have a good libido and wants to have sex. Reviewed his chart there is history of gynecomastia, but patient does not recall this and states that he cannot remember ever having a large breast tissue or any evaluation for this he denies any past history of low testosterone. No vision changes, no claudication, no exertional CP or SOB works out every morning No change to hair or skin  PCKD -patient states that all of his blood pressure medications are for kidney disease, see nephrology, Dr. Juleen China, states that he continues to send him in for his kidney but he does not know the last time and that he has seen him or done lab work with Dr. Juleen China No sx with kidney disease, no pain no hematuria On losartan 100 mg, HCTZ 25 mg, amlodipine 10 mg, metoprolol 12.5 mg XR daily  Did review his med list as far back as I can see in this electronic medical record there have been only very subtle changes to his blood pressure medication doses and most of them a few years ago no recent changes that coincide with his symptoms of ED  He denies any  history of prostate enlargement or prostate problems. Denies any history of cardiovascular disease, chest pain with exertion, claudication  Patient also mentions a skin tag to the back of his left leg which she would like removed, skin tag is flat and wide with a very small base at slightly more than a centimeter and a half in diameter to the back of his left thigh no erythema pain drainage from the base but he would like to have it removed   Patient Active Problem List   Diagnosis Date Noted  . Polycystic kidney disease 05/29/2017  . Elevated liver enzymes 05/31/2015  . Overweight (BMI 25.0-29.9) 03/09/2015  . Gout   . Gynecomastia, male   . Eczema   . Hyperlipidemia   . Hypertension       Current Outpatient Medications:  .  amLODipine (NORVASC) 10 MG tablet, TAKE 1 TABLET(10 MG) BY MOUTH DAILY, Disp: 90 tablet, Rfl: 1 .  hydrochlorothiazide (HYDRODIURIL) 25 MG tablet, Take 1 tablet (25 mg total) by mouth daily., Disp: 90 tablet, Rfl: 1 .  ketoconazole (NIZORAL) 2 % shampoo, SHAMPOO WITH A SMALL AMOUNT TO SKIN ONCE A DAY, Disp: , Rfl:  .  losartan (COZAAR) 100 MG tablet, Take 1 tablet (100 mg total) by mouth daily., Disp: 30 tablet, Rfl: 0 .  metoprolol succinate (TOPROL-XL)  25 MG 24 hr tablet, Take 1/2 (one-half) tablet by mouth once daily, Disp: 45 tablet, Rfl: 0 .  potassium citrate (UROCIT-K) 5 MEQ (540 MG) SR tablet, Take 5 mEq by mouth daily., Disp: , Rfl:  .  Efinaconazole 10 % SOLN, Apply 1 application topically daily. (Patient not taking: Reported on 07/03/2019), Disp: 1 Bottle, Rfl: 1   No Known Allergies   Family History  Problem Relation Age of Onset  . Hypertension Mother   . Kidney disease Father   . Hypertension Father   . Dementia Father   . Squamous cell carcinoma Father   . Cancer Father        SCC  . Diabetes Father   . Heart disease Father   . Cancer Brother        hodgkin's lymphoma  . Heart disease Brother 7       3 vessel CABG  . Heart disease  Maternal Grandmother   . Stroke Maternal Grandmother   . Heart disease Maternal Grandfather   . Stroke Maternal Grandfather   . Heart disease Paternal Grandmother   . Heart disease Paternal Grandfather      Social History   Socioeconomic History  . Marital status: Married    Spouse name: Not on file  . Number of children: Not on file  . Years of education: Not on file  . Highest education level: Not on file  Occupational History  . Not on file  Tobacco Use  . Smoking status: Never Smoker  . Smokeless tobacco: Former Systems developer    Types: Chew  Substance and Sexual Activity  . Alcohol use: No  . Drug use: No  . Sexual activity: Yes  Other Topics Concern  . Not on file  Social History Narrative  . Not on file   Social Determinants of Health   Financial Resource Strain:   . Difficulty of Paying Living Expenses:   Food Insecurity:   . Worried About Charity fundraiser in the Last Year:   . Arboriculturist in the Last Year:   Transportation Needs:   . Film/video editor (Medical):   Marland Kitchen Lack of Transportation (Non-Medical):   Physical Activity:   . Days of Exercise per Week:   . Minutes of Exercise per Session:   Stress:   . Feeling of Stress :   Social Connections:   . Frequency of Communication with Friends and Family:   . Frequency of Social Gatherings with Friends and Family:   . Attends Religious Services:   . Active Member of Clubs or Organizations:   . Attends Archivist Meetings:   Marland Kitchen Marital Status:   Intimate Partner Violence:   . Fear of Current or Ex-Partner:   . Emotionally Abused:   Marland Kitchen Physically Abused:   . Sexually Abused:     Chart Review Today: I personally reviewed active problem list, medication list, allergies, family history, social history, health maintenance, notes from last encounter, lab results, imaging with the patient/caregiver today.   Review of Systems 10 Systems reviewed and are negative for acute change except as noted  in the HPI.    Objective:   Vitals:   07/03/19 0927  BP: 120/80  Pulse: 83  Resp: 14  Temp: 97.9 F (36.6 C)  SpO2: 98%  Weight: 204 lb (92.5 kg)  Height: 6\' 2"  (1.88 m)    Body mass index is 26.19 kg/m.  Physical Exam Vitals and nursing note reviewed.  Constitutional:  General: He is not in acute distress.    Appearance: Normal appearance. He is well-developed. He is not ill-appearing, toxic-appearing or diaphoretic.     Interventions: Face mask in place.  HENT:     Head: Normocephalic and atraumatic.     Jaw: No trismus.     Right Ear: External ear normal.     Left Ear: External ear normal.  Eyes:     General: Lids are normal. No scleral icterus.    Conjunctiva/sclera: Conjunctivae normal.  Neck:     Trachea: Trachea and phonation normal. No tracheal deviation.  Cardiovascular:     Rate and Rhythm: Normal rate and regular rhythm.     Pulses:          Radial pulses are 2+ on the right side and 2+ on the left side.       Dorsalis pedis pulses are 2+ on the right side and 2+ on the left side.       Posterior tibial pulses are 2+ on the right side and 2+ on the left side.     Heart sounds: Normal heart sounds. No murmur. No friction rub. No gallop.   Pulmonary:     Effort: Pulmonary effort is normal. No respiratory distress.     Breath sounds: Normal breath sounds. No stridor. No wheezing, rhonchi or rales.  Abdominal:     General: Bowel sounds are normal. There is no distension.     Palpations: Abdomen is soft.     Tenderness: There is no abdominal tenderness.  Musculoskeletal:        General: Normal range of motion.     Cervical back: Normal range of motion and neck supple.     Right lower leg: No edema.     Left lower leg: No edema.  Skin:    General: Skin is warm and dry.     Capillary Refill: Capillary refill takes less than 2 seconds.     Coloration: Skin is not jaundiced.     Findings: No rash.     Nails: There is no clubbing.     Comments: Normal  hair distribution to bilateral lower extremities  Neurological:     Mental Status: He is alert.     Cranial Nerves: No dysarthria or facial asymmetry.     Motor: No tremor or abnormal muscle tone.     Gait: Gait normal.  Psychiatric:        Mood and Affect: Mood normal.        Speech: Speech normal.        Behavior: Behavior normal. Behavior is cooperative.            Assessment & Plan:    1. Erectile dysfunction, unspecified erectile dysfunction type Patient reports several years of dealing with erectile dysfunction but a significant difference in the past 4 to 5 months with unable to achieve a full firm erection or do any penetration but he does still have the desire of sex drive.  Per chart review there is some history of gynecomastia and I believe mentioned in the past history of low testosterone although I cannot find any labs for testosterone.  Low T possible etiology -discussed doing testosterone labs and if they are low referring him to urology for remainder of evaluation and discussion of testosterone replacement and prostate evaluation etc. - Testosterone We discussed other causes of ED does not have diabetes but he does have high cholesterol and multiple blood pressure medications which she states  are only for polycystic kidney disease and not for hypertension.  On 4 medications of blood pressure today is well controlled at 120/80.  He denies any past medical history of cardiovascular disease, peripheral vascular disease, he has no anginal symptoms no claudication symptoms.  Discussed how if there is no other etiology for ED we can sometimes try the medications as long as he has low heart risk no med interactions.  Did his cholesterol labs today to aid in that possible future evaluation  We discussed psychosocial factors leading to ED, medication side effects which may be a potential cause.   2. Mixed hyperlipidemia Hx of HLD w/o med management - repeat labs, recheck ASCVD  risk - COMPLETE METABOLIC PANEL WITH GFR - Lipid panel  3. Hypertension secondary to other renal disorders Well controlled on current meds - COMPLETE METABOLIC PANEL WITH GFR    4. Skin tag  L91.8    return for procedure to remove        Delsa Grana, PA-C 07/03/19 9:48 AM

## 2019-07-04 DIAGNOSIS — N529 Male erectile dysfunction, unspecified: Secondary | ICD-10-CM | POA: Diagnosis not present

## 2019-07-04 DIAGNOSIS — E782 Mixed hyperlipidemia: Secondary | ICD-10-CM | POA: Diagnosis not present

## 2019-07-04 DIAGNOSIS — N2889 Other specified disorders of kidney and ureter: Secondary | ICD-10-CM | POA: Diagnosis not present

## 2019-07-04 DIAGNOSIS — I151 Hypertension secondary to other renal disorders: Secondary | ICD-10-CM | POA: Diagnosis not present

## 2019-07-05 ENCOUNTER — Other Ambulatory Visit: Payer: Self-pay | Admitting: Family Medicine

## 2019-07-05 DIAGNOSIS — N529 Male erectile dysfunction, unspecified: Secondary | ICD-10-CM

## 2019-07-05 DIAGNOSIS — R7989 Other specified abnormal findings of blood chemistry: Secondary | ICD-10-CM

## 2019-07-05 LAB — COMPLETE METABOLIC PANEL WITH GFR
AG Ratio: 1.8 (calc) (ref 1.0–2.5)
ALT: 9 U/L (ref 9–46)
AST: 13 U/L (ref 10–35)
Albumin: 4.2 g/dL (ref 3.6–5.1)
Alkaline phosphatase (APISO): 55 U/L (ref 35–144)
BUN: 12 mg/dL (ref 7–25)
CO2: 33 mmol/L — ABNORMAL HIGH (ref 20–32)
Calcium: 9.6 mg/dL (ref 8.6–10.3)
Chloride: 106 mmol/L (ref 98–110)
Creat: 0.86 mg/dL (ref 0.70–1.33)
GFR, Est African American: 117 mL/min/{1.73_m2} (ref >=60)
GFR, Est Non African American: 101 mL/min/{1.73_m2} (ref >=60)
Globulin: 2.4 g/dL (calc) (ref 1.9–3.7)
Glucose, Bld: 85 mg/dL (ref 65–99)
Potassium: 3.8 mmol/L (ref 3.5–5.3)
Sodium: 143 mmol/L (ref 135–146)
Total Bilirubin: 0.5 mg/dL (ref 0.2–1.2)
Total Protein: 6.6 g/dL (ref 6.1–8.1)

## 2019-07-05 LAB — LIPID PANEL
Cholesterol: 139 mg/dL (ref <200)
HDL: 42 mg/dL (ref >=40)
LDL Cholesterol (Calc): 81 mg/dL (calc)
Non-HDL Cholesterol (Calc): 97 mg/dL (calc) (ref <130)
Total CHOL/HDL Ratio: 3.3 (calc) (ref <5.0)
Triglycerides: 81 mg/dL (ref <150)

## 2019-07-05 LAB — TESTOSTERONE: Testosterone: 151 ng/dL — ABNORMAL LOW (ref 250–827)

## 2019-08-08 ENCOUNTER — Ambulatory Visit: Payer: BC Managed Care – PPO | Admitting: Urology

## 2019-08-08 ENCOUNTER — Encounter: Payer: Self-pay | Admitting: Urology

## 2019-08-08 ENCOUNTER — Other Ambulatory Visit: Payer: Self-pay

## 2019-08-08 VITALS — BP 146/91 | HR 77 | Ht 74.0 in | Wt 207.0 lb

## 2019-08-08 DIAGNOSIS — N529 Male erectile dysfunction, unspecified: Secondary | ICD-10-CM | POA: Diagnosis not present

## 2019-08-08 DIAGNOSIS — Q613 Polycystic kidney, unspecified: Secondary | ICD-10-CM

## 2019-08-08 DIAGNOSIS — R7989 Other specified abnormal findings of blood chemistry: Secondary | ICD-10-CM

## 2019-08-08 MED ORDER — TADALAFIL 5 MG PO TABS: 5.0000 mg | ORAL_TABLET | ORAL | 11 refills | Status: DC

## 2019-08-08 NOTE — Patient Instructions (Signed)
Tadalafil tablets (Cialis) What is this medicine? TADALAFIL (tah DA la fil) is used to treat erection problems in men. It is also used for enlargement of the prostate gland in men, a condition called benign prostatic hyperplasia or BPH. This medicine improves urine flow and reduces BPH symptoms. This medicine can also treat both erection problems and BPH when they occur together. This medicine may be used for other purposes; ask your health care provider or pharmacist if you have questions. COMMON BRAND NAME(S): Adcirca, ALYQ, Cialis What should I tell my health care provider before I take this medicine? They need to know if you have any of these conditions:  bleeding disorders  eye or vision problems, including a rare inherited eye disease called retinitis pigmentosa  anatomical deformation of the penis, Peyronie's disease, or history of priapism (painful and prolonged erection)  heart disease, angina, a history of heart attack, irregular heart beats, or other heart problems  high or low blood pressure  history of blood diseases, like sickle cell anemia or leukemia  history of stomach bleeding  kidney disease  liver disease  stroke  an unusual or allergic reaction to tadalafil, other medicines, foods, dyes, or preservatives  pregnant or trying to get pregnant  breast-feeding How should I use this medicine? Take this medicine by mouth with a glass of water. Follow the directions on the prescription label. You may take this medicine with or without meals. When this medicine is used for erection problems, your doctor may prescribe it to be taken once daily or as needed. If you are taking the medicine as needed, you may be able to have sexual activity 30 minutes after taking it and for up to 36 hours after taking it. Whether you are taking the medicine as needed or once daily, you should not take more than one dose per day. If you are taking this medicine for symptoms of benign  prostatic hyperplasia (BPH) or to treat both BPH and an erection problem, take the dose once daily at about the same time each day. Do not take your medicine more often than directed. Talk to your pediatrician regarding the use of this medicine in children. Special care may be needed. Overdosage: If you think you have taken too much of this medicine contact a poison control center or emergency room at once. NOTE: This medicine is only for you. Do not share this medicine with others. What if I miss a dose? If you are taking this medicine as needed for erection problems, this does not apply. If you miss a dose while taking this medicine once daily for an erection problem, benign prostatic hyperplasia, or both, take it as soon as you remember, but do not take more than one dose per day. What may interact with this medicine? Do not take this medicine with any of the following medications:  nitrates like amyl nitrite, isosorbide dinitrate, isosorbide mononitrate, nitroglycerin  other medicines for erectile dysfunction like avanafil, sildenafil, vardenafil  other tadalafil products (Adcirca)  riociguat This medicine may also interact with the following medications:  certain drugs for high blood pressure  certain drugs for the treatment of HIV infection or AIDS  certain drugs used for fungal or yeast infections, like fluconazole, itraconazole, ketoconazole, and voriconazole  certain drugs used for seizures like carbamazepine, phenytoin, and phenobarbital  grapefruit juice  macrolide antibiotics like clarithromycin, erythromycin, troleandomycin  medicines for prostate problems  rifabutin, rifampin or rifapentine This list may not describe all possible interactions. Give your   health care provider a list of all the medicines, herbs, non-prescription drugs, or dietary supplements you use. Also tell them if you smoke, drink alcohol, or use illegal drugs. Some items may interact with your  medicine. What should I watch for while using this medicine? If you notice any changes in your vision while taking this drug, call your doctor or health care professional as soon as possible. Stop using this medicine and call your health care provider right away if you have a loss of sight in one or both eyes. Contact your doctor or health care professional right away if the erection lasts longer than 4 hours or if it becomes painful. This may be a sign of serious problem and must be treated right away to prevent permanent damage. If you experience symptoms of nausea, dizziness, chest pain or arm pain upon initiation of sexual activity after taking this medicine, you should refrain from further activity and call your doctor or health care professional as soon as possible. Do not drink alcohol to excess (examples, 5 glasses of wine or 5 shots of whiskey) when taking this medicine. When taken in excess, alcohol can increase your chances of getting a headache or getting dizzy, increasing your heart rate or lowering your blood pressure. Using this medicine does not protect you or your partner against HIV infection (the virus that causes AIDS) or other sexually transmitted diseases. What side effects may I notice from receiving this medicine? Side effects that you should report to your doctor or health care professional as soon as possible:  allergic reactions like skin rash, itching or hives, swelling of the face, lips, or tongue  breathing problems  changes in hearing  changes in vision  chest pain  fast, irregular heartbeat  prolonged or painful erection  seizures Side effects that usually do not require medical attention (report to your doctor or health care professional if they continue or are bothersome):  back pain  dizziness  flushing  headache  indigestion  muscle aches  nausea  stuffy or runny nose This list may not describe all possible side effects. Call your doctor  for medical advice about side effects. You may report side effects to FDA at 1-800-FDA-1088. Where should I keep my medicine? Keep out of the reach of children. Store at room temperature between 15 and 30 degrees C (59 and 86 degrees F). Throw away any unused medicine after the expiration date. NOTE: This sheet is a summary. It may not cover all possible information. If you have questions about this medicine, talk to your doctor, pharmacist, or health care provider.  2020 Elsevier/Gold Standard (2013-08-23 13:15:49) Erectile Dysfunction Erectile dysfunction (ED) is the inability to get or keep an erection in order to have sexual intercourse. Erectile dysfunction may include:  Inability to get an erection.  Lack of enough hardness of the erection to allow penetration.  Loss of the erection before sex is finished. What are the causes? This condition may be caused by:  Certain medicines, such as: ? Pain relievers. ? Antihistamines. ? Antidepressants. ? Blood pressure medicines. ? Water pills (diuretics). ? Ulcer medicines. ? Muscle relaxants. ? Drugs.  Excessive drinking.  Psychological causes, such as: ? Anxiety. ? Depression. ? Sadness. ? Exhaustion. ? Performance fear. ? Stress.  Physical causes, such as: ? Artery problems. This may include diabetes, smoking, liver disease, or atherosclerosis. ? High blood pressure. ? Hormonal problems, such as low testosterone. ? Obesity. ? Nerve problems. This may include back or pelvic   injuries, diabetes mellitus, multiple sclerosis, or Parkinson disease. What are the signs or symptoms? Symptoms of this condition include:  Inability to get an erection.  Lack of enough hardness of the erection to allow penetration.  Loss of the erection before sex is finished.  Normal erections at some times, but with frequent unsatisfactory episodes.  Low sexual satisfaction in either partner due to erection problems.  A curved penis  occurring with erection. The curve may cause pain or the penis may be too curved to allow for intercourse.  Never having nighttime erections. How is this diagnosed? This condition is often diagnosed by:  Performing a physical exam to find other diseases or specific problems with the penis.  Asking you detailed questions about the problem.  Performing blood tests to check for diabetes mellitus or to measure hormone levels.  Performing other tests to check for underlying health conditions.  Performing an ultrasound exam to check for scarring.  Performing a test to check blood flow to the penis.  Doing a sleep study at home to measure nighttime erections. How is this treated? This condition may be treated by:  Medicine taken by mouth to help you achieve an erection (oral medicine).  Hormone replacement therapy to replace low testosterone levels.  Medicine that is injected into the penis. Your health care provider may instruct you how to give yourself these injections at home.  Vacuum pump. This is a pump with a ring on it. The pump and ring are placed on the penis and used to create pressure that helps the penis become erect.  Penile implant surgery. In this procedure, you may receive: ? An inflatable implant. This consists of cylinders, a pump, and a reservoir. The cylinders can be inflated with a fluid that helps to create an erection, and they can be deflated after intercourse. ? A semi-rigid implant. This consists of two silicone rubber rods. The rods provide some rigidity. They are also flexible, so the penis can both curve downward in its normal position and become straight for sexual intercourse.  Blood vessel surgery, to improve blood flow to the penis. During this procedure, a blood vessel from a different part of the body is placed into the penis to allow blood to flow around (bypass) damaged or blocked blood vessels.  Lifestyle changes, such as exercising more, losing  weight, and quitting smoking. Follow these instructions at home: Medicines   Take over-the-counter and prescription medicines only as told by your health care provider. Do not increase the dosage without first discussing it with your health care provider.  If you are using self-injections, perform injections as directed by your health care provider. Make sure to avoid any veins that are on the surface of the penis. After giving an injection, apply pressure to the injection site for 5 minutes. General instructions  Exercise regularly, as directed by your health care provider. Work with your health care provider to lose weight, if needed.  Do not use any products that contain nicotine or tobacco, such as cigarettes and e-cigarettes. If you need help quitting, ask your health care provider.  Before using a vacuum pump, read the instructions that come with the pump and discuss any questions with your health care provider.  Keep all follow-up visits as told by your health care provider. This is important. Contact a health care provider if:  You feel nauseous.  You vomit. Get help right away if:  You are taking oral or injectable medicines and you have an   erection that lasts longer than 4 hours. If your health care provider is unavailable, go to the nearest emergency room for evaluation. An erection that lasts much longer than 4 hours can result in permanent damage to your penis.  You have severe pain in your groin or abdomen.  You develop redness or severe swelling of your penis.  You have redness spreading up into your groin or lower abdomen.  You are unable to urinate.  You experience chest pain or a rapid heart beat (palpitations) after taking oral medicines. Summary  Erectile dysfunction (ED) is the inability to get or keep an erection during sexual intercourse. This problem can usually be treated successfully.  This condition is diagnosed based on a physical exam, your  symptoms, and tests to determine the cause. Treatment varies depending on the cause, and may include medicines, hormone therapy, surgery, or vacuum pump.  You may need follow-up visits to make sure that you are using your medicines or devices correctly.  Get help right away if you are taking or injecting medicines and you have an erection that lasts longer than 4 hours. This information is not intended to replace advice given to you by your health care provider. Make sure you discuss any questions you have with your health care provider. Document Revised: 03/17/2017 Document Reviewed: 04/20/2016 Elsevier Patient Education  2020 [employer]  

## 2019-08-08 NOTE — Progress Notes (Signed)
08/08/19 2:28 PM   David [name] [birth year] [id number]  CC: Low Testosterone, ED  HPI: I saw David [name] in urology clinic today for evaluation of low testosterone and erectile dysfunction.  He is a 49 year old male with past medical history notable for polycystic kidney disease and hypertension who reports about 6 months of increased difficulty obtaining and maintaining an erection. He is on a number of blood pressure medications. A testosterone was checked by his primary care physician in March 2021 and was low at 151.  He has never tried any medications for erections before.  He denies any decreased libido, low energy, or weight gain.  He denies any gross hematuria or flank pain.  He denies any family history of prostate or kidney cancer.  Renal function is normal with sCr 0.86, eGFR >60.   PMH: Past Medical History:  Diagnosis Date  . Eczema   . Gout   . Gynecomastia, male left  . History of pyelonephritis Jan 2017  . Hyperlipidemia   . Hypertension   . Polycystic kidney disease   . Snoring     Surgical History: Past Surgical History:  Procedure Laterality Date  . NO PAST SURGERIES      Family History: Family History  Problem Relation Age of Onset  . Hypertension Mother   . Kidney disease Father   . Hypertension Father   . Dementia Father   . Squamous cell carcinoma Father   . Cancer Father        SCC  . Diabetes Father   . Heart disease Father   . Cancer Brother        hodgkin's lymphoma  . Heart disease Brother 102       3 vessel CABG  . Heart disease Maternal Grandmother   . Stroke Maternal Grandmother   . Heart disease Maternal Grandfather   . Stroke Maternal Grandfather   . Heart disease Paternal Grandmother   . Heart disease Paternal Grandfather     Social History:  reports that he has never smoked. He has quit using smokeless tobacco.  His smokeless tobacco use included chew. He reports that he does not drink alcohol or use drugs.  Physical  Exam: BP (!) 146/91 (BP Location: Left Arm, Patient Position: Sitting, Cuff Size: Normal)   Pulse 77   Ht '6\' 2"'  (1.88 m)   Wt 207 lb (93.9 kg)   BMI 26.58 kg/m    Constitutional:  Alert and oriented, No acute distress. Cardiovascular: No clubbing, cyanosis, or edema. Respiratory: Normal respiratory effort, no increased work of breathing. 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  He denies any symptoms of low testosterone, aside from ED.  We discussed the increased risk of cardiovascular disease with low testosterone.  We discussed the risks and benefits at length of repeat testosterone, LH, PSA, and H/H for further work-up, and potential testosterone replacement.  He would like to defer further evaluation of his low testosterone at this time, or testosterone supplementation.  He is interested in trying medications for his erectile dysfunction.  We discussed the PDE 5 inhibitors at length including the risks and benefits.  I recommended a trial of Cialis 5 mg either on demand or every other day.    Trial of Cialis  5-10 mg on demand, or every other day Follow-up 6 weeks for virtual visit If persistent ED, consider further evaluation of low testosterone and potential replacement   David Madrid, MD 08/08/2019  [place] [address] [place], [place] [address] [phone]

## 2019-08-14 ENCOUNTER — Ambulatory Visit: Payer: BC Managed Care – PPO | Admitting: Family Medicine

## 2019-08-14 ENCOUNTER — Other Ambulatory Visit: Payer: Self-pay | Admitting: Family Medicine

## 2019-08-14 ENCOUNTER — Encounter: Payer: Self-pay | Admitting: Family Medicine

## 2019-08-14 ENCOUNTER — Other Ambulatory Visit: Payer: Self-pay

## 2019-08-14 VITALS — BP 122/84 | HR 86 | Temp 97.8°F | Resp 14 | Ht 74.0 in | Wt 206.0 lb

## 2019-08-14 DIAGNOSIS — L989 Disorder of the skin and subcutaneous tissue, unspecified: Secondary | ICD-10-CM

## 2019-08-14 NOTE — Progress Notes (Signed)
Patient ID: NASIF LEN, male    DOB: 13-Sep-1970, 49 y.o.   MRN: [id number]  PCP: Delsa Grana, PA-C  Chief Complaint  Patient presents with  . Skin Tag    removal    Subjective:   TYSE KOCHEVAR is a 49 y.o. male, presents to clinic with CC of the following:  HPI  Here for skin tag removal - had mentioned at prior office visit- showed me a fairly large skin tag to the back of his left thigh. He presents requesting 3 skin tags be removed.  He has large dime size skin tag to left posterior thigh, similarly large and shaped skin tag to right buttock/gluteal fold area and then smaller roughly 8x6 mm size skin tag to his lower back.    Patient Active Problem List   Diagnosis Date Noted  . Polycystic kidney disease 05/29/2017  . Elevated liver enzymes 05/31/2015  . Overweight (BMI 25.0-29.9) 03/09/2015  . Gout   . Gynecomastia, male   . Eczema   . Hyperlipidemia   . Hypertension       Current Outpatient Medications:  .  amLODipine (NORVASC) 10 MG tablet, TAKE 1 TABLET(10 MG) BY MOUTH DAILY, Disp: 90 tablet, Rfl: 1 .  Efinaconazole 10 % SOLN, Apply 1 application topically daily., Disp: 1 Bottle, Rfl: 1 .  hydrochlorothiazide (HYDRODIURIL) 25 MG tablet, Take 1 tablet (25 mg total) by mouth daily., Disp: 90 tablet, Rfl: 1 .  ketoconazole (NIZORAL) 2 % shampoo, SHAMPOO WITH A SMALL AMOUNT TO SKIN ONCE A DAY, Disp: , Rfl:  .  losartan (COZAAR) 100 MG tablet, Take 1 tablet (100 mg total) by mouth daily., Disp: 30 tablet, Rfl: 0 .  metoprolol succinate (TOPROL-XL) 25 MG 24 hr tablet, Take 1/2 (one-half) tablet by mouth once daily, Disp: 45 tablet, Rfl: 0 .  potassium citrate (UROCIT-K) 5 MEQ (540 MG) SR tablet, Take 5 mEq by mouth daily., Disp: , Rfl:  .  tadalafil (CIALIS) 5 MG tablet, Take 1-2 tablets (5-10 mg total) by mouth every other day., Disp: 30 tablet, Rfl: 11   No Known Allergies   Family History  Problem Relation Age of Onset  . Hypertension Mother   . Kidney  disease Father   . Hypertension Father   . Dementia Father   . Squamous cell carcinoma Father   . Cancer Father        SCC  . Diabetes Father   . Heart disease Father   . Cancer Brother        hodgkin's lymphoma  . Heart disease Brother 35       3 vessel CABG  . Heart disease Maternal Grandmother   . Stroke Maternal Grandmother   . Heart disease Maternal Grandfather   . Stroke Maternal Grandfather   . Heart disease Paternal Grandmother   . Heart disease Paternal Grandfather      Social History   Socioeconomic History  . Marital status: Married    Spouse name: Not on file  . Number of children: Not on file  . Years of education: Not on file  . Highest education level: Not on file  Occupational History  . Not on file  Tobacco Use  . Smoking status: Never Smoker  . Smokeless tobacco: Former Systems developer    Types: Chew  Substance and Sexual Activity  . Alcohol use: No  . Drug use: No  . Sexual activity: Yes  Other Topics Concern  . Not on file  Social  History Narrative  . Not on file   Social Determinants of Health   Financial Resource Strain:   . Difficulty of Paying Living Expenses:   Food Insecurity:   . Worried About Charity fundraiser in the Last Year:   . Arboriculturist in the Last Year:   Transportation Needs:   . Film/video editor (Medical):   Marland Kitchen Lack of Transportation (Non-Medical):   Physical Activity:   . Days of Exercise per Week:   . Minutes of Exercise per Session:   Stress:   . Feeling of Stress :   Social Connections:   . Frequency of Communication with Friends and Family:   . Frequency of Social Gatherings with Friends and Family:   . Attends Religious Services:   . Active Member of Clubs or Organizations:   . Attends Archivist Meetings:   Marland Kitchen Marital Status:   Intimate Partner Violence:   . Fear of Current or Ex-Partner:   . Emotionally Abused:   Marland Kitchen Physically Abused:   . Sexually Abused:     Chart Review Today: I  personally reviewed active problem list, medication list, allergies, family history, social history, health maintenance, notes from last encounter, lab results, imaging with the patient/caregiver today.   Review of Systems     Objective:   Vitals:   08/14/19 1141  Height: 6\' 2"  (1.88 m)    Body mass index is 26.58 kg/m.  Physical Exam Vitals and nursing note reviewed.  Constitutional:      Appearance: He is well-developed.  HENT:     Head: Normocephalic and atraumatic.     Nose: Nose normal.  Eyes:     General:        Right eye: No discharge.        Left eye: No discharge.     Conjunctiva/sclera: Conjunctivae normal.  Neck:     Trachea: No tracheal deviation.  Cardiovascular:     Rate and Rhythm: Normal rate and regular rhythm.  Pulmonary:     Effort: Pulmonary effort is normal. No respiratory distress.     Breath sounds: No stridor.  Musculoskeletal:        General: Normal range of motion.  Skin:    General: Skin is warm and dry.     Findings: Lesion present. No rash.     Comments: Large skin lesion flesh colored, roughly 2cm x 2cm x 1 cm lesion/skin tag with 10x4 mm base  Neurological:     Mental Status: He is alert.     Motor: No abnormal muscle tone.     Coordination: Coordination normal.  Psychiatric:        Behavior: Behavior normal.       Procedure Note   PROCEDURE: excision of skin lesion Performing Provider: Delsa Grana, PA-C  Patient education prior to procedure, explained risk, benefits of skin tag removal, reviewed alternative options. Patient reported understanding. Gave consent to continue with procedure.   PROCEDURE:  Sterile Preparation:   Betadine x 3  Location of skin lesion:  Left posterior upper thigh PE findings:  See PE above (roughly 2cm x2cm) Removal of the skin lesion warranted due to frequent irritation Procedure including risks and benefits explained to patient and verbal consent obtained.   The area was prepped with Betadine  x 3. The area around the skin tag was anesthestized with 2 mL of 1% lidocaine w/o epinephrine was injected. #11 blade was used to cut lesion across the base (pedunculated)  and the skin tag was removed in an intact manner.  Skin tag sent to pathology.  incision was roughly 8x6 mm wide,  Hemostasis achieved with single horizontal mattress suture with 4-0 vicryl and applying direct pressure  Followup: The patient tolerated the procedure well without complications.  Standard post-procedure care is explained and return precautions are given.       Assessment & Plan:      ICD-10-CM   1. Skin lesion of left leg  L98.9 Pathology ([facility])   removed today due to frequent trauma and irritation  2. Skin lesion of back  L98.9    wil need f/up visit for procedure - smaller in size, does not appear to have trauma or inflammation  3. Skin lesion  L98.9    to right buttock - will need f/up appt for procedure and to address        Delsa Grana, PA-C 08/14/19 12:25 PM

## 2019-08-14 NOTE — Patient Instructions (Addendum)
Keep antibiotic ointment over the area for the next 3-5 days and then can apply vaseline to the area to help with healing. Keep bandaged for the next week.  Can leave open at night or when in clean environment.   Follow up if concerns of infection or delayed healing  The stitch is likely to fall out and can dissolve, follow up here if there is any problem with the suture.    Skin Tag, Adult  A skin tag (acrochordon) is a soft, extra growth of skin. Most skin tags are flesh-colored and rarely bigger than a pencil eraser. They commonly form near areas where there are folds in the skin, such as the armpit or groin. Skin tags are not dangerous, and they do not spread from person to person (are not contagious). You may have one skin tag or several. Skin tags do not require treatment. However, your health care provider may recommend removal of a skin tag if it:  Gets irritated from clothing.  Bleeds.  Is visible and unsightly. Your health care provider can remove skin tags with a simple surgical procedure or a procedure that involves freezing the skin tag. Follow these instructions at home:  Watch for any changes in your skin tag. A normal skin tag does not require any other special care at home.  Take over-the-counter and prescription medicines only as told by your health care provider.  Keep all follow-up visits as told by your health care provider. This is important. Contact a health care provider if:  You have a skin tag that: ? Becomes painful. ? Changes color. ? Bleeds. ? Swells.  You develop more skin tags. This information is not intended to replace advice given to you by your health care provider. Make sure you discuss any questions you have with your health care provider. Document Revised: 03/17/2017 Document Reviewed: 04/19/2015 Elsevier Patient Education  2020 [employer].

## 2019-08-16 LAB — PATHOLOGY REPORT

## 2019-08-16 LAB — TISSUE SPECIMEN

## 2019-08-27 ENCOUNTER — Other Ambulatory Visit: Payer: Self-pay

## 2019-08-27 ENCOUNTER — Encounter: Payer: Self-pay | Admitting: Family Medicine

## 2019-08-27 ENCOUNTER — Ambulatory Visit: Payer: BC Managed Care – PPO | Admitting: Family Medicine

## 2019-08-27 VITALS — BP 116/76 | HR 77 | Temp 98.3°F | Resp 14 | Ht 74.0 in | Wt 206.6 lb

## 2019-08-27 DIAGNOSIS — Z5189 Encounter for other specified aftercare: Secondary | ICD-10-CM | POA: Diagnosis not present

## 2019-08-27 DIAGNOSIS — L989 Disorder of the skin and subcutaneous tissue, unspecified: Secondary | ICD-10-CM

## 2019-08-27 MED ORDER — LIDOCAINE HCL (PF) 1 % IJ SOLN
2.0000 mL | Freq: Once | INTRAMUSCULAR | Status: AC
  Administered 2019-08-27: 2 mL via INTRADERMAL

## 2019-08-27 NOTE — Progress Notes (Signed)
Patient ID: David [name], male    DOB: 07-01-1970, 49 y.o.   MRN: [id number]  PCP: Delsa Grana, PA-C  Chief Complaint  Patient presents with  . Biopsy    skin tag removal    Subjective:   David [name] is a 49 y.o. male, presents to clinic with CC of the following:  HPI  Patient presents for recheck of prior skin tag removal, he does not have any questions about pathology, he noted that it bled slightly after procedure until that evening and then he did not have any further bleeding problems denies any swelling redness, purulent drainage or concerning tenderness.  It is healing well and getting smaller.  He presents for requested removal of larger skin tag to right lower buttocks and small flatter skin tag to right low back.   Patient Active Problem List   Diagnosis Date Noted  . Polycystic kidney disease 05/29/2017  . Elevated liver enzymes 05/31/2015  . Overweight (BMI 25.0-29.9) 03/09/2015  . Gout   . Gynecomastia, male   . Eczema   . Hyperlipidemia   . Hypertension       Current Outpatient Medications:  .  amLODipine (NORVASC) 10 MG tablet, TAKE 1 TABLET(10 MG) BY MOUTH DAILY, Disp: 90 tablet, Rfl: 1 .  Efinaconazole 10 % SOLN, Apply 1 application topically daily., Disp: 1 Bottle, Rfl: 1 .  hydrochlorothiazide (HYDRODIURIL) 25 MG tablet, Take 1 tablet (25 mg total) by mouth daily., Disp: 90 tablet, Rfl: 1 .  ketoconazole (NIZORAL) 2 % shampoo, SHAMPOO WITH A SMALL AMOUNT TO SKIN ONCE A DAY, Disp: , Rfl:  .  losartan (COZAAR) 100 MG tablet, Take 1 tablet (100 mg total) by mouth daily., Disp: 30 tablet, Rfl: 0 .  metoprolol succinate (TOPROL-XL) 25 MG 24 hr tablet, Take 1/2 (one-half) tablet by mouth once daily, Disp: 45 tablet, Rfl: 0 .  potassium citrate (UROCIT-K) 5 MEQ (540 MG) SR tablet, Take 5 mEq by mouth daily., Disp: , Rfl:  .  tadalafil (CIALIS) 5 MG tablet, Take 1-2 tablets (5-10 mg total) by mouth every other day., Disp: 30 tablet, Rfl: 11   No Known  Allergies   Family History  Problem Relation Age of Onset  . Hypertension Mother   . Kidney disease Father   . Hypertension Father   . Dementia Father   . Squamous cell carcinoma Father   . Cancer Father        SCC  . Diabetes Father   . Heart disease Father   . Cancer Brother        hodgkin's lymphoma  . Heart disease Brother 43       3 vessel CABG  . Heart disease Maternal Grandmother   . Stroke Maternal Grandmother   . Heart disease Maternal Grandfather   . Stroke Maternal Grandfather   . Heart disease Paternal Grandmother   . Heart disease Paternal Grandfather      Social History   Socioeconomic History  . Marital status: Married    Spouse name: Not on file  . Number of children: Not on file  . Years of education: Not on file  . Highest education level: Not on file  Occupational History  . Not on file  Tobacco Use  . Smoking status: Never Smoker  . Smokeless tobacco: Former Systems developer    Types: Chew  Substance and Sexual Activity  . Alcohol use: No  . Drug use: No  . Sexual activity: Yes  Other Topics  Concern  . Not on file  Social History Narrative  . Not on file   Social Determinants of Health   Financial Resource Strain:   . Difficulty of Paying Living Expenses:   Food Insecurity:   . Worried About Charity fundraiser in the Last Year:   . Arboriculturist in the Last Year:   Transportation Needs:   . Film/video editor (Medical):   Marland Kitchen Lack of Transportation (Non-Medical):   Physical Activity:   . Days of Exercise per Week:   . Minutes of Exercise per Session:   Stress:   . Feeling of Stress :   Social Connections:   . Frequency of Communication with Friends and Family:   . Frequency of Social Gatherings with Friends and Family:   . Attends Religious Services:   . Active Member of Clubs or Organizations:   . Attends Archivist Meetings:   Marland Kitchen Marital Status:   Intimate Partner Violence:   . Fear of Current or Ex-Partner:   .  Emotionally Abused:   Marland Kitchen Physically Abused:   . Sexually Abused:     Chart Review Today: I personally reviewed active problem list, medication list, allergies, family history, social history, health maintenance, notes from last encounter, lab results, imaging with the patient/caregiver today.   Review of Systems 10 Systems reviewed and are negative for acute change except as noted in the HPI.     Objective:   Vitals:   08/27/19 1131  BP: 116/76  Pulse: 77  Resp: 14  Temp: 98.3 F (36.8 C)  SpO2: 98%  Weight: 206 lb 9.6 oz (93.7 kg)  Height: 6\' 2"  (1.88 m)    Body mass index is 26.53 kg/m.  Physical Exam Vitals and nursing note reviewed.  Constitutional:      General: He is not in acute distress.    Appearance: Normal appearance. He is well-developed. He is not ill-appearing, toxic-appearing or diaphoretic.  HENT:     Head: Normocephalic and atraumatic.     Nose: Nose normal.  Neck:     Trachea: No tracheal deviation.  Pulmonary:     Effort: Pulmonary effort is normal. No respiratory distress.  Musculoskeletal:        General: Normal range of motion.  Skin:    General: Skin is warm and dry.     Findings: No rash.     Comments: Right low back - small round raised flesh colored lesion, wide base,  lesion approximately 56mm diameter round to slightly oval, 2-3 mm height, no surrounding erythema, edema, induration  Right buttock - pedunculated 26mmx15mmx5mm skin lesion with smaller base, roughly 5x47mm, mild irritation at base  Left upper thigh, 2x56mm healing wound from prior lesion/procedure, mild scabbing, dry skin, center pink, no edema, erythema, induration, ttp  Neurological:     Mental Status: He is alert.     Motor: No abnormal muscle tone.     Coordination: Coordination normal.  Psychiatric:        Behavior: Behavior normal.      Results for orders placed or performed in visit on 08/14/19  Pathology Report  Result Value Ref Range   Relevant History:      Pathologist:    TISSUE SPECIMEN  Result Value Ref Range   A Source     A Gross Description     A Diagnosis     Skin Tag Removal Procedure Note    PROCEDURE: Skin tag removal Performing Provider: Delsa Grana,  PA-C  Patient education prior to procedure, explained risk, benefits of skin tag removal, reviewed alternative options. Patient reported understanding. Gave consent to continue with procedure.   PROCEDURE:  Sterile Preparation:   Betadinex3  Location of skin tag:  lesion #1 right lower back Lesion #2 right buttock  PE findings:    Right low back - small round raised flesh colored lesion, wide base,  lesion approximately 38mm diameter round to slightly oval, 2-3 mm height, no surrounding erythema, edema, induration  Right buttock - pedunculated 27mmx15mmx5mm skin lesion with smaller base, roughly 5x34mm, mild irritation at base  Removal of the skin tag (skin lesion #2) warranted due to frequent irritation, skin lesion #1 smaller, no signs of trauma, explained lack of possible medical necessity, and that insurance may not cover the procedure cost to remove, may say it is cosmetic, explained he may be charged for procedure costs - he wanted to proceed with procedure to remove both Procedure including risks and benefits explained to patient and verbal consent obtained.   The area was prepped with Betadinex3. The area around the skin tag was anesthestized with 2 mL of 1% lidocaine w/o epinephrine- for each lesion for total of 4 mL.  Lesion #1 to back -  Skin tag grasped at largest part with forceps. Skin tag cut with iris scissors at base.  Scant slow bleeding, hemostasis achieved with few touches of bovi Lesion #2 was grasped at largest part, removed largely with bovi, no bleeding, hemostasis achieved by removing with cautery. Skin tag sent to pathology.      Followup: The patient tolerated the procedure well without complications.   Wounds bandaged, antibiotic ointment and bandage  applied. Standard post-procedure care is explained and return precautions are given.  Pt verbalizes understanding      Assessment & Plan:      ICD-10-CM   1. Skin lesion of back  L98.9 Pathology ([facility])   benign appearing, flesh colored, raised, no signs of infection or inflammation, removed per pt request, pathology sent  2. Skin lesion  L98.9 Pathology ([facility])   to right buttock - irritating, frequent trauma  3. Encounter for wound re-check  Z51.89    Pt experiences slow bleeding with last procedure until later in the day, no brisk bleeding or concerning amounts, healing well, no swelling, redness or pain    Pt other two lesions removed, this time utilizing cautery to decrease bleeding time, with lack of lido w/epi, and without silver nitrate or any other hemostatic agents other than prolonged amount of time holding direct pressure.  Patient was in agreement with this method.  Already well, encouraged him to follow-up with any concerns or need for recheck.  Patient preferred not to have the after visit summary printed again for him since he has paperwork from last visit    Delsa Grana, PA-C 08/27/19 12:16 PM

## 2019-08-27 NOTE — Addendum Note (Signed)
Addended by: Docia Furl on: 08/27/2019 04:16 PM   Modules accepted: Orders

## 2019-08-28 MED ORDER — LIDOCAINE HCL (PF) 1 % IJ SOLN
2.0000 mL | Freq: Once | INTRAMUSCULAR | Status: AC
  Administered 2019-08-28: 2 mL via INTRADERMAL

## 2019-08-28 NOTE — Addendum Note (Signed)
Addended by: Docia Furl on: 08/28/2019 08:28 AM   Modules accepted: Orders

## 2019-08-29 LAB — TISSUE PATH REPORT

## 2019-08-29 LAB — PATHOLOGY REPORT

## 2019-09-06 ENCOUNTER — Other Ambulatory Visit: Payer: Self-pay

## 2019-09-06 DIAGNOSIS — I151 Hypertension secondary to other renal disorders: Secondary | ICD-10-CM

## 2019-09-06 MED ORDER — AMLODIPINE BESYLATE 10 MG PO TABS: ORAL_TABLET | ORAL | 3 refills | Status: DC

## 2019-09-25 ENCOUNTER — Encounter: Payer: Self-pay | Admitting: Urology

## 2019-09-25 ENCOUNTER — Other Ambulatory Visit: Payer: Self-pay

## 2019-09-25 ENCOUNTER — Ambulatory Visit: Payer: BC Managed Care – PPO | Admitting: Urology

## 2019-09-25 VITALS — BP 130/80 | HR 69 | Ht 74.0 in | Wt 209.0 lb

## 2019-09-25 DIAGNOSIS — N529 Male erectile dysfunction, unspecified: Secondary | ICD-10-CM | POA: Insufficient documentation

## 2019-09-25 NOTE — Progress Notes (Signed)
   09/25/2019 5:01 PM   David [name] 09/03/1970 [id number]  Reason for visit: Follow up ED  HPI: I saw David [name] in urology clinic for follow-up of erectile dysfunction.  At our last visit in April 2021 he opted for trial of Cialis 5 to 10 mg every other day.  He is noted moderate improvement in his erections on the 10 mg dose every other day.  He denies any bothersome side effects.  He also has a history of a low testosterone in March 2021 at 151.  He wanted to hold off on repeating testosterone or replacing testosterone, and try PDE 5 inhibitors alone first.  Increase Cialis to 10 mg daily RTC 4 to 6 weeks for symptom check, if still bothersome symptoms either increase Cialis to 20 mg or repeat AM testosterone and consider replacement  I spent 20 total minutes on the day of the encounter including pre-visit review of the medical record, face-to-face time with the patient, and post visit ordering of labs/imaging/tests.  David [name], [facility] [address], [place], [place] [address] [phone]

## 2019-10-14 ENCOUNTER — Other Ambulatory Visit: Payer: Self-pay

## 2019-10-14 MED ORDER — TADALAFIL 5 MG PO TABS: 10.0000 mg | ORAL_TABLET | Freq: Every day | ORAL | 11 refills | Status: DC | PRN

## 2019-10-16 ENCOUNTER — Other Ambulatory Visit: Payer: Self-pay | Admitting: *Deleted

## 2019-10-16 MED ORDER — TADALAFIL 5 MG PO TABS: 10.0000 mg | ORAL_TABLET | Freq: Every day | ORAL | 11 refills | Status: DC | PRN

## 2019-10-30 ENCOUNTER — Ambulatory Visit: Payer: Self-pay | Admitting: Urology

## 2019-11-13 ENCOUNTER — Ambulatory Visit: Payer: Self-pay | Admitting: Urology

## 2019-12-18 ENCOUNTER — Encounter: Payer: Self-pay | Admitting: Family Medicine

## 2019-12-18 DIAGNOSIS — Z1211 Encounter for screening for malignant neoplasm of colon: Secondary | ICD-10-CM

## 2019-12-26 ENCOUNTER — Other Ambulatory Visit: Payer: Self-pay

## 2019-12-26 ENCOUNTER — Telehealth (INDEPENDENT_AMBULATORY_CARE_PROVIDER_SITE_OTHER): Payer: Self-pay | Admitting: Gastroenterology

## 2019-12-26 DIAGNOSIS — Z1211 Encounter for screening for malignant neoplasm of colon: Secondary | ICD-10-CM

## 2019-12-26 MED ORDER — NA SULFATE-K SULFATE-MG SULF 17.5-3.13-1.6 GM/177ML PO SOLN: 354.0000 mL | Freq: Once | ORAL | 0 refills | Status: AC

## 2019-12-26 NOTE — Progress Notes (Signed)
Gastroenterology Pre-Procedure Review  Request Date: 02/03/2020 Requesting Physician: Dr. Marius Ditch   PATIENT REVIEW QUESTIONS: The patient responded to the following health history questions as indicated:    1. Are you having any GI issues? no 2. Do you have a personal history of Polyps? no 3. Do you have a family history of Colon Cancer or Polyps? no 4. Diabetes Mellitus? no 5. Joint replacements in the past 12 months?no 6. Major health problems in the past 3 months?no 7. Any artificial heart valves, MVP, or defibrillator?no    MEDICATIONS & ALLERGIES:    Patient reports the following regarding taking any anticoagulation/antiplatelet therapy:   Plavix, Coumadin, Eliquis, Xarelto, Lovenox, Pradaxa, Brilinta, or Effient? no Aspirin? no  Patient confirms/reports the following medications:  Current Outpatient Medications  Medication Sig Dispense Refill  . amLODipine (NORVASC) 10 MG tablet TAKE 1 TABLET(10 MG) BY MOUTH DAILY 90 tablet 3  . Efinaconazole 10 % SOLN Apply 1 application topically daily. 1 Bottle 1  . hydrochlorothiazide (HYDRODIURIL) 25 MG tablet Take 1 tablet (25 mg total) by mouth daily. 90 tablet 1  . ketoconazole (NIZORAL) 2 % shampoo SHAMPOO WITH A SMALL AMOUNT TO SKIN ONCE A DAY    . losartan (COZAAR) 100 MG tablet Take 1 tablet (100 mg total) by mouth daily. 30 tablet 0  . potassium citrate (UROCIT-K) 5 MEQ (540 MG) SR tablet Take 5 mEq by mouth daily.    . tadalafil (CIALIS) 5 MG tablet Take 2 tablets (10 mg total) by mouth daily as needed for erectile dysfunction. 30 tablet 11  . Na Sulfate-K Sulfate-Mg Sulf 17.5-3.13-1.6 GM/177ML SOLN Take 354 mLs by mouth once for 1 dose. 354 mL 0   No current facility-administered medications for this visit.    Patient confirms/reports the following allergies:  No Known Allergies  No orders of the defined types were placed in this encounter.   AUTHORIZATION INFORMATION Primary Insurance: 1D#: Group #:  Secondary  Insurance: 1D#: Group #:  SCHEDULE INFORMATION: Date:  Time: Location:

## 2020-01-29 ENCOUNTER — Telehealth: Payer: Self-pay

## 2020-01-29 NOTE — Telephone Encounter (Signed)
Returned patients call. LVM to call us back to reschedule procedure.

## 2020-01-29 NOTE — Telephone Encounter (Signed)
Patient called David [name] and stated that he had a family emergency and is unable to do procedure on 02/03/2020. Called patient and left  A detail message to rescheduled his colonoscopy. Informed patient to call us back when he is ready to rescheduled.

## 2020-01-30 ENCOUNTER — Other Ambulatory Visit: Admission: RE | Admit: 2020-01-30 | Payer: BC Managed Care – PPO | Source: Ambulatory Visit

## 2020-02-03 ENCOUNTER — Ambulatory Visit: Admit: 2020-02-03 | Payer: BC Managed Care – PPO | Admitting: Gastroenterology

## 2020-02-03 SURGERY — COLONOSCOPY WITH PROPOFOL
Anesthesia: General

## 2020-02-26 ENCOUNTER — Other Ambulatory Visit: Payer: Self-pay

## 2020-02-26 DIAGNOSIS — Z1211 Encounter for screening for malignant neoplasm of colon: Secondary | ICD-10-CM

## 2020-02-26 MED ORDER — PEG 3350-KCL-NA BICARB-NACL 420 G PO SOLR: 4000.0000 mL | Freq: Once | ORAL | 0 refills | Status: AC

## 2020-03-17 ENCOUNTER — Telehealth: Payer: Self-pay | Admitting: Gastroenterology

## 2020-03-17 ENCOUNTER — Telehealth: Payer: Self-pay

## 2020-03-17 NOTE — Telephone Encounter (Signed)
Patient states he will be in [place] the day he is suppose to get his covid test, and will not be near a [facility] facility. Per CMA [name] pt has to [facility]. Pt needs to resch procedure with Dr. Marius Ditch from 12.6.21. Pt had phone screening 9.9.21

## 2020-03-17 NOTE — Telephone Encounter (Signed)
Patient called and wanted to know if he could take his covid test on Dec 1st or Dec 3rd,2021. His procedure is scheduled on 03/23/20. He stated that he will be out of town on Dec 2nd and will not be able to take his covid test on Dec 2nd,2021. Triage [facility] stated that patient has to take Covid Test on Dec 2nd or reschedule procedure. Information was given to patient and patient stated that he may try to make other arrangements and will call our office back to confirm or reschedule procedure.

## 2020-03-17 NOTE — Telephone Encounter (Signed)
Returned patients call to reschedule his colonoscopy.  He said he will need to call back next year to reschedule due to his wife's schedule.  I told him we could schedule in January, and he said he would call back.  Thanks,  [place], [place]

## 2020-03-18 ENCOUNTER — Other Ambulatory Visit: Admission: RE | Admit: 2020-03-18 | Payer: BC Managed Care – PPO | Source: Ambulatory Visit

## 2020-03-18 ENCOUNTER — Telehealth: Payer: Self-pay

## 2020-03-18 NOTE — Telephone Encounter (Signed)
Called patient to reschedule procedure. Pt states he will call back the first of the year to reschedule.

## 2020-03-23 ENCOUNTER — Ambulatory Visit
Admission: RE | Admit: 2020-03-23 | Payer: BC Managed Care – PPO | Source: Home / Self Care | Admitting: Gastroenterology

## 2020-03-23 ENCOUNTER — Encounter: Admission: RE | Payer: Self-pay | Source: Home / Self Care

## 2020-03-23 SURGERY — COLONOSCOPY WITH PROPOFOL
Anesthesia: General

## 2020-04-02 ENCOUNTER — Ambulatory Visit (INDEPENDENT_AMBULATORY_CARE_PROVIDER_SITE_OTHER): Payer: BC Managed Care – PPO | Admitting: Urology

## 2020-04-02 ENCOUNTER — Other Ambulatory Visit: Payer: Self-pay

## 2020-04-02 ENCOUNTER — Encounter: Payer: Self-pay | Admitting: Urology

## 2020-04-02 VITALS — BP 154/82 | HR 61 | Ht 74.0 in | Wt 209.0 lb

## 2020-04-02 DIAGNOSIS — N529 Male erectile dysfunction, unspecified: Secondary | ICD-10-CM | POA: Diagnosis not present

## 2020-04-02 DIAGNOSIS — R7989 Other specified abnormal findings of blood chemistry: Secondary | ICD-10-CM | POA: Diagnosis not present

## 2020-04-02 MED ORDER — TADALAFIL 20 MG PO TABS: 20.0000 mg | ORAL_TABLET | ORAL | 6 refills | Status: DC

## 2020-04-02 NOTE — Patient Instructions (Signed)
Testosterone Replacement Therapy  Testosterone replacement therapy (TRT) is used to treat men who have a low testosterone level (hypogonadism). Testosterone is a male hormone that is produced in the testicles. It is responsible for typically male characteristics and for maintaining a man's sex drive and the ability to get an erection. Testosterone also supports bone and muscle health. TRT can be a gel, liquid, or patch that you put on your skin. It can also be in the form of a tablet or an injection. In some cases, your health care provider may insert long-acting pellets under your skin. In most men, the level of testosterone starts to decline gradually after age 58. Low testosterone can also be caused by certain medical conditions, medicines, and obesity. Your health care provider can diagnose hypogonadism with at least two blood tests that are done early in the morning. Low testosterone may not need to be treated. TRT is usually a choice that you make with your health care provider. Your health care provider may recommend TRT if you have low testosterone that is causing symptoms, such as:  Low sex drive.  Erection problems.  Breast enlargement.  Loss of body hair.  Weak muscles or bones.  Shrinking testicles.  Increased body fat.  Low energy.  Hot flashes.  Depression.  Decreased work Systems analyst. TRT is a lifetime treatment. If you stop treatment, your testosterone will drop, and your symptoms may return. What are the risks? Testosterone replacement therapy may have side effects, including:  Lower sperm count.  Skin irritation at the application or injection site.  Mouth irritation if you take an oral tablet.  Acne.  Swelling of your legs or feet.  Tender breasts.  Dizziness.  Sleep disturbance.  Mood swings.  Possible increased risk of stroke or heart attack. Testosterone replacement therapy may also increase your risk for prostate cancer or male breast cancer.  You should not use TRT if you have either of those conditions. Your health care provider also may not recommend TRT if:  You are suspected of having prostate cancer.  You want to father a child.  You have a high number of red blood cells.  You have untreated sleep apnea.  You have a very large prostate. Supplies needed:  Your health care provider will prescribe the testosterone gel, solution, or medicine that you need. If your health care provider teaches you to do self-injections at home, you will also need: ? Your medicine vial. ? Disposable needles and syringes. ? Alcohol swabs. ? A needle disposal container. ? Adhesive bandages. How to use testosterone replacement therapy Your health care provider will help you find the TRT option that will work best for you based on your preference, the side effects, and the cost. You may:  Rub testosterone gel on your upper arm or shoulder every day after a shower. This is the most common type of TRT. Do not let women or children come in contact with the gel.  Apply a testosterone solution under your arms once each day.  Place a testosterone patch on your skin once each day.  Dissolve a testosterone tablet in your mouth twice each day.  Have a testosterone pellet inserted under your skin by your health care provider. This will be replaced every 3-6 months.  Use testosterone nasal spray three times each day.  Get testosterone injections. For some types of testosterone, your health care provider will give you this injection. With other types of testosterone, you may be taught to give injections to  yourself. The frequency of injections may vary based on the type of testosterone that you receive. Follow these instructions at home:  Take over-the-counter and prescription medicines only as told by your health care provider.  Lose weight if you are overweight. Ask your health care provider to help you start a healthy diet and exercise program to  reach and maintain a healthy weight.  Work with your health care provider to treat other medical conditions that may lower your testosterone. These include obesity, high blood pressure, high cholesterol, diabetes, liver disease, kidney disease, and sleep apnea.  Keep all follow-up visits as told by your health care provider. This is important. General recommendations  Discuss all risks and benefits with your health care provider before starting therapy.  Work with your health care provider to check your prostate health and do blood testing before you start therapy.  Do not use any testosterone replacement therapies that are not prescribed by your health care provider or not approved for use in the [place]  Do not use TRT for bodybuilding or to improve sexual performance. TRT should be used only to treat symptoms of low testosterone.  Return for all repeat prostate checks and blood tests during therapy, as told by your health care provider. Where to find more information Learn more about testosterone replacement therapy from:  [place]: www.urologyhealth.org/urologic-conditions/low-testosterone-(hypogonadism)  Endocrine Society: www.hormone.org/diseases-and-conditions/mens-health/hypogonadism Contact a health care provider if:  You have side effects from your testosterone replacement therapy.  You continue to have symptoms of low testosterone during treatment.  You develop new symptoms during treatment. Summary  Testosterone replacement therapy is only for men who have low testosterone as determined by blood testing and who have symptoms of low testosterone.  Testosterone replacement therapy should be prescribed only by a health care provider and should be used under the supervision of a health care provider.  You may not be able to take testosterone if you have certain medical conditions, including prostate cancer, male breast cancer, or heart  disease.  Testosterone replacement therapy may have side effects and may make some medical conditions worse.  Talk with your health care provider about all the risks and benefits before you start therapy. This information is not intended to replace advice given to you by your health care provider. Make sure you discuss any questions you have with your health care provider. Document Revised: 04/17/2016 Document Reviewed: 12/24/2015 Elsevier Patient Education  2020 [employer].

## 2020-04-02 NOTE — Progress Notes (Signed)
   04/02/2020 10:11 AM   David [name] Jun 15, 1970 [id number]  Reason for visit: Follow up ED, low testosterone  HPI: I saw David [name] back in clinic for ED and low testosterone.  I last saw him in June 2021 and he was taking Cialis 5 to 10 mg every other day with improvement in his ED.  He also has a history of low testosterone, measuring 151 in March 2021, but he wanted to hold off on repeating T levels or replacing testosterone and try PDE 5 inhibitors alone.  He is not having any fatigue or other symptoms of low testosterone aside from ED.  He reports that over the last few months of the Cialis 10 mg every other day has decreased in effectiveness, and his shim score today is 14 indicating mild to moderate ED.  I recommended increasing the Cialis to 20 mg every other day to see if he gets improvement with his erections.  We discussed the risks and benefits.  I also recommended repeating a testosterone and LH today based on the AUA guidelines for low testosterone work-up.  He is amenable to considering testosterone replacement if he continues to have a ED despite the higher dose of Cialis.  Cialis increased to 20 mg every other day Testosterone, LH, H&H today RTC with PA 3 to 4 weeks for ED check, consider testosterone replacement  Billey Co, MD  [facility] [address] [place], [place] [address] [phone]

## 2020-04-03 LAB — HEMOGLOBIN AND HEMATOCRIT, BLOOD
Hematocrit: 40.2 % (ref 37.5–51.0)
Hemoglobin: 13.2 g/dL (ref 13.0–17.7)

## 2020-04-03 LAB — LUTEINIZING HORMONE: LH: 3.4 m[IU]/mL (ref 1.7–8.6)

## 2020-04-03 LAB — TESTOSTERONE: Testosterone: 153 ng/dL — ABNORMAL LOW (ref 264–916)

## 2020-04-28 ENCOUNTER — Telehealth: Payer: Self-pay | Admitting: Gastroenterology

## 2020-04-28 ENCOUNTER — Other Ambulatory Visit: Payer: Self-pay

## 2020-04-28 DIAGNOSIS — Z1211 Encounter for screening for malignant neoplasm of colon: Secondary | ICD-10-CM

## 2020-04-28 NOTE — Telephone Encounter (Signed)
Patient called and would like to reschedule Colonoscopy

## 2020-04-29 NOTE — Progress Notes (Signed)
04/30/2020 7:25 PM   David [name] 01-Sep-1970 [id number]  Referring provider: Delsa Grana, PA-C [address] [place],  [place] [address]  Chief Complaint  Patient presents with  . Erectile Dysfunction   Urological history 1. ED - SHIM: 12 - taking Cialis 20 mg every other day  2. Testosterone deficiency - not on therapy at this time  3. High risk hematuria - non-smoker - polycystic kidney disease - work up in 2017 CT abd/ pelvis w/ contrast on 05/09/15 shows normal 12 cm kidneys with multiple low-attenuating lesions bilaterally (cyst vs. Early abscess) with significant bilateral perinephric stranding including around the proximal ureters without hydronephrosis. Follow-up CT scan on 06/15/2015 t contrast shows nearly complete resolution of perinephric edema/inflammation.  Cysto NED - No reports of gross hematuria - UA negative for micro heme  HPI: [name] is a 50 year old male who presents today for follow up after a trial of Cialis 20 mg every other day and having two morning testosterone levels below 300.    He has no urinary complaints at this time.  Patient denies any modifying or aggravating factors.  Patient denies any gross hematuria, dysuria or suprapubic/flank pain.  Patient denies any fevers, chills, nausea or vomiting.    IPSS    Row Name 04/30/20 1100         International Prostate Symptom Score   How often have you had the sensation of not emptying your bladder? Not at All     How often have you had to urinate less than every two hours? Not at All     How often have you found you stopped and started again several times when you urinated? Not at All     How often have you found it difficult to postpone urination? Not at All     How often have you had a weak urinary stream? Not at All     How often have you had to strain to start urination? Not at All     How many times did you typically get up at night to urinate? None     Total IPSS Score  0           Quality of Life due to urinary symptoms   If you were to spend the rest of your life with your urinary condition just the way it is now how would you feel about that? Pleased            Score:  1-7 Mild 8-19 Moderate 20-35 Severe  Patient still having spontaneous erections.  He denies any pain or curvature with erections.    SHIM    Row Name 04/30/20 1127         SHIM: Over the last 6 months:   How do you rate your confidence that you could get and keep an erection? Very Low     When you had erections with sexual stimulation, how often were your erections hard enough for penetration (entering your partner)? Sometimes (about half the time)     During sexual intercourse, how often were you able to maintain your erection after you had penetrated (entered) your partner? A Few Times (much less than half the time)     During sexual intercourse, how difficult was it to maintain your erection to completion of intercourse? Difficult     When you attempted sexual intercourse, how often was it satisfactory for you? Sometimes (about half the time)  SHIM Total Score   SHIM 12            Score: 1-7 Severe ED 8-11 Moderate ED 12-16 Mild-Moderate ED 17-21 Mild ED 22-25 No ED    PMH: Past Medical History:  Diagnosis Date  . Eczema   . ED (erectile dysfunction)   . Gout   . Gynecomastia, male left  . History of pyelonephritis Jan 2017  . Hyperlipidemia   . Hypertension   . Polycystic kidney disease   . Snoring     Surgical History: Past Surgical History:  Procedure Laterality Date  . COLONOSCOPY WITH PROPOFOL N/A 05/18/2020   Procedure: COLONOSCOPY WITH PROPOFOL;  Surgeon: Lin Landsman, MD;  Location: [facility] ENDOSCOPY;  Service: Gastroenterology;  Laterality: N/A;  . NO PAST SURGERIES      Home Medications:  Allergies as of 04/30/2020   No Known Allergies     Medication List       Accurate as of April 30, 2020 11:59 PM. If you have  any questions, ask your nurse or doctor.        amLODipine 10 MG tablet Commonly known as: NORVASC TAKE 1 TABLET(10 MG) BY MOUTH DAILY   hydrochlorothiazide 25 MG tablet Commonly known as: HYDRODIURIL Take 1 tablet (25 mg total) by mouth daily.   losartan 100 MG tablet Commonly known as: COZAAR Take 1 tablet (100 mg total) by mouth daily.   tadalafil 20 MG tablet Commonly known as: CIALIS Take 1 tablet (20 mg total) by mouth every other day.   Testosterone 20.25 MG/1.25GM (1.62%) Gel Commonly known as: AndroGel Place 2 Pump onto the skin daily. Started by: Zara Council, PA-C       Allergies: No Known Allergies  Family History: Family History  Problem Relation Age of Onset  . Hypertension Mother   . Kidney disease Father   . Hypertension Father   . Dementia Father   . Squamous cell carcinoma Father   . Cancer Father        SCC  . Diabetes Father   . Heart disease Father   . Cancer Brother        hodgkin's lymphoma  . Heart disease Brother 79       3 vessel CABG  . Heart disease Maternal Grandmother   . Stroke Maternal Grandmother   . Heart disease Maternal Grandfather   . Stroke Maternal Grandfather   . Heart disease Paternal Grandmother   . Heart disease Paternal Grandfather     Social History:  reports that he has never smoked. His smokeless tobacco use includes chew. He reports that he does not drink alcohol and does not use drugs.  ROS: Pertinent ROS in HPI  Physical Exam: BP (!) 144/93   Pulse 73   Ht '6\' 2"'  (1.88 m)   Wt 202 lb (91.6 kg)   BMI 25.94 kg/m   Constitutional:  Well nourished. Alert and oriented, No acute distress. HEENT:  AT, mask in place.  Trachea midline Cardiovascular: No clubbing, cyanosis, or edema. Respiratory: Normal respiratory effort, no increased work of breathing. Neurologic: Grossly intact, no focal deficits, moving all 4 extremities. Psychiatric: Normal mood and affect.  Laboratory Data: Lab Results   Component Value Date   CREATININE 0.86 07/04/2019    Lab Results  Component Value Date   TESTOSTERONE 153 (L) 04/02/2020    Lab Results  Component Value Date   HGBA1C 5.1 01/11/2019    Lab Results  Component Value Date   TSH 0.69  01/11/2019       Component Value Date/Time   CHOL 139 07/04/2019 0803   CHOL 161 03/09/2015 0825   CHOL 146 09/16/2011 0512   HDL 42 07/04/2019 0803   HDL 39 (L) 03/09/2015 0825   HDL 27 (L) 09/16/2011 0512   CHOLHDL 3.3 07/04/2019 0803   VLDL 31 09/16/2011 0512   LDLCALC 81 07/04/2019 0803   LDLCALC 88 09/16/2011 0512    Lab Results  Component Value Date   AST 13 07/04/2019   Lab Results  Component Value Date   ALT 9 07/04/2019    Urinalysis Component     Latest Ref Rng & Units 04/30/2020  Specific Gravity, UA     1.005 - 1.030 1.020  pH, UA     5.0 - 7.5 8.0 (H)  Color, UA     Yellow Straw  Appearance Ur     Clear Cloudy (A)  Leukocytes,UA     Negative Negative  Protein,UA     Negative/Trace Negative  Glucose, UA     Negative Negative  Ketones, UA     Negative Negative  RBC, UA     Negative Trace (A)  Bilirubin, UA     Negative Negative  Urobilinogen, Ur     0.2 - 1.0 mg/dL 0.2  Nitrite, UA     Negative Negative  Microscopic Examination      See below:   Component     Latest Ref Rng & Units 04/30/2020  WBC, UA     0 - 5 /hpf 0-5  RBC     0 - 2 /hpf 0-2  Epithelial Cells (non renal)     0 - 10 /hpf None seen  Casts     None seen /lpf Present (A)  Cast Type     N/A Granular casts (A)  Crystals     N/A Present (A)  Crystal Type     N/A Amorphous Sediment  Bacteria, UA     None seen/Few None seen  I have reviewed the labs.   Pertinent Imaging: I have independently reviewed the films.    Assessment & Plan:    1. ED - SHIM 12 - continue tadalafil 20 mg, on-demand-dosing  2. Testosterone deficiency - met criteria - I discussed with the patient the side effects of testosterone therapy, such as:  enlargement of the prostate gland that may in turn cause LUTS, possible increased risk of PCa, DVT's and/or PE's, possible increased risk of heart attack or stroke, lower sperm count, swelling of the ankles, feet, or body, with or without heart failure, enlarged or painful breasts, have problems breathing while you sleep (sleep apnea), increased prostate specific antigen, mood swings, hypertension and increased red blood cell count - we will need to check HCT/HBG levels prior to therapy and PSA if the patient is over 40 - I also discussed that some men have had success using clomid for hypogonadism.  It does seem to be more successful in younger men, but there are incidences of good results in middle-aged men.  I explained that it is used in male infertility to stimulate the testicles to make more testosterone/sperm.  There has been no long term data on side effects, but some urologists has been having success with this medication.  - he would like to start on a topical testosterone treatment at this time - prolactin - PSA   Return in about 1 month (around 05/31/2020) for testosterone level 2 to 4 hours after applied the  gel .  These notes generated with voice recognition software. I apologize for typographical errors.  Zara Council, PA-C  [facility] [address]  [place], [place] [address] [phone]

## 2020-04-30 ENCOUNTER — Other Ambulatory Visit: Payer: Self-pay

## 2020-04-30 ENCOUNTER — Ambulatory Visit (INDEPENDENT_AMBULATORY_CARE_PROVIDER_SITE_OTHER): Payer: BC Managed Care – PPO | Admitting: Urology

## 2020-04-30 ENCOUNTER — Encounter: Payer: Self-pay | Admitting: Urology

## 2020-04-30 VITALS — BP 144/93 | HR 73 | Ht 74.0 in | Wt 202.0 lb

## 2020-04-30 DIAGNOSIS — N529 Male erectile dysfunction, unspecified: Secondary | ICD-10-CM | POA: Diagnosis not present

## 2020-04-30 DIAGNOSIS — E349 Endocrine disorder, unspecified: Secondary | ICD-10-CM

## 2020-04-30 DIAGNOSIS — Q613 Polycystic kidney, unspecified: Secondary | ICD-10-CM | POA: Diagnosis not present

## 2020-04-30 MED ORDER — TESTOSTERONE 20.25 MG/1.25GM (1.62%) TD GEL
2.0000 | Freq: Every day | TRANSDERMAL | 3 refills | Status: DC
Start: 2020-04-30 — End: 2022-08-29

## 2020-05-01 ENCOUNTER — Other Ambulatory Visit: Payer: Self-pay | Admitting: Urology

## 2020-05-01 ENCOUNTER — Telehealth: Payer: Self-pay

## 2020-05-01 DIAGNOSIS — R7989 Other specified abnormal findings of blood chemistry: Secondary | ICD-10-CM

## 2020-05-01 LAB — URINALYSIS, COMPLETE
Bilirubin, UA: NEGATIVE
Glucose, UA: NEGATIVE
Ketones, UA: NEGATIVE
Leukocytes,UA: NEGATIVE
Nitrite, UA: NEGATIVE
Protein,UA: NEGATIVE
Specific Gravity, UA: 1.02 (ref 1.005–1.030)
Urobilinogen, Ur: 0.2 mg/dL (ref 0.2–1.0)
pH, UA: 8 — ABNORMAL HIGH (ref 5.0–7.5)

## 2020-05-01 LAB — MICROSCOPIC EXAMINATION
Bacteria, UA: NONE SEEN
Epithelial Cells (non renal): NONE SEEN /hpf (ref 0–10)

## 2020-05-01 LAB — PROLACTIN: Prolactin: 183 ng/mL — ABNORMAL HIGH (ref 4.0–15.2)

## 2020-05-01 LAB — PSA: Prostate Specific Ag, Serum: 0.9 ng/mL (ref 0.0–4.0)

## 2020-05-01 NOTE — Telephone Encounter (Signed)
Patient called asking to speak to [facility] in regards to previous conversation.  I let pt know that scheduling would call him to schedule the MRI.Marland Kitchen He would not disclose information regarding your prior conversation

## 2020-05-05 ENCOUNTER — Ambulatory Visit: Payer: BC Managed Care – PPO | Admitting: Internal Medicine

## 2020-05-05 ENCOUNTER — Encounter: Payer: Self-pay | Admitting: Internal Medicine

## 2020-05-05 VITALS — BP 146/92 | HR 75 | Temp 97.4°F | Resp 16 | Ht 74.0 in | Wt 206.8 lb

## 2020-05-05 DIAGNOSIS — D443 Neoplasm of uncertain behavior of pituitary gland: Secondary | ICD-10-CM | POA: Diagnosis not present

## 2020-05-05 DIAGNOSIS — G44019 Episodic cluster headache, not intractable: Secondary | ICD-10-CM | POA: Diagnosis not present

## 2020-05-05 DIAGNOSIS — N2889 Other specified disorders of kidney and ureter: Secondary | ICD-10-CM

## 2020-05-05 DIAGNOSIS — D352 Benign neoplasm of pituitary gland: Secondary | ICD-10-CM

## 2020-05-05 DIAGNOSIS — Q613 Polycystic kidney, unspecified: Secondary | ICD-10-CM

## 2020-05-05 DIAGNOSIS — I151 Hypertension secondary to other renal disorders: Secondary | ICD-10-CM | POA: Diagnosis not present

## 2020-05-05 DIAGNOSIS — I10 Essential (primary) hypertension: Secondary | ICD-10-CM | POA: Diagnosis not present

## 2020-05-05 MED ORDER — POTASSIUM CITRATE ER 5 MEQ (540 MG) PO TBCR: 5.0000 meq | EXTENDED_RELEASE_TABLET | Freq: Every day | ORAL | 1 refills | Status: DC

## 2020-05-05 MED ORDER — LOSARTAN POTASSIUM-HCTZ 50-12.5 MG PO TABS: 1.0000 | ORAL_TABLET | Freq: Every day | ORAL | 3 refills | Status: DC

## 2020-05-05 MED ORDER — AMLODIPINE BESYLATE 10 MG PO TABS: ORAL_TABLET | ORAL | 3 refills | Status: DC

## 2020-05-05 NOTE — Telephone Encounter (Signed)
I left a message for David [name] returning his call from Friday.

## 2020-05-05 NOTE — Progress Notes (Signed)
[facility] [address], [place] [address]  Internal MEDICINE  Office Visit Note  Patient Name: David [name]  [id number]  [id number]  Date of Service: 05/05/2020   Complaints/HPI  Chief Complaint  Patient presents with  . New Patient (Initial Visit)    Elevated prolactin level, refill request  . Hyperlipidemia  . Hypertension   HPI  Pt is here for establishment of PCP. He is here with few concerns and complaints, wife in the room with him 1.C/o headaches off and on, now getting worse, thinks might have blurred vision at times, no nausea or vomiting  2. H/O polycystic renal disease ( not sure of hepatic involvement) followed by nephrology, recent Cr is normal, baseline BP is elevated, not sure if he has essential/primary HTN or secondary due to renal cystic disease. Gets U/S done every year by nephrology, he is on  Norvasc 10 mg and hctz 25 mg qd  3. C/O erectile dysfunction, low total testosterone levels ( free Testosterone is not available). Elevated prolactin level, no TSH levels available. He was just stated on hormone replacement with androgen  4. PSA is normal , scheduled to have Colonoscopy, Myoview in 2013 was normal, Lipid profile is normal as well  5. [place] od AAA ( father)   Current Medication: Outpatient Encounter Medications as of 05/05/2020  Medication Sig  . losartan-hydrochlorothiazide (HYZAAR) 50-12.5 MG tablet Take 1 tablet by mouth daily.  . tadalafil (CIALIS) 20 MG tablet Take 1 tablet (20 mg total) by mouth every other day.  . Testosterone (ANDROGEL) 20.25 MG/1.25GM (1.62%) GEL Place 2 Pump onto the skin daily.  . [DISCONTINUED] amLODipine (NORVASC) 10 MG tablet TAKE 1 TABLET(10 MG) BY MOUTH DAILY  . [DISCONTINUED] hydrochlorothiazide (HYDRODIURIL) 25 MG tablet Take 1 tablet (25 mg total) by mouth daily.  . [DISCONTINUED] potassium citrate (UROCIT-K) 5 MEQ (540 MG) SR tablet Take by mouth daily.  Marland Kitchen amLODipine (NORVASC) 10 MG tablet TAKE 1  TABLET(10 MG) BY MOUTH DAILY  . Efinaconazole 10 % SOLN Apply 1 application topically daily. (Patient not taking: Reported on 05/05/2020)  . potassium citrate (UROCIT-K) 5 MEQ (540 MG) SR tablet Take 1 tablet (5 mEq total) by mouth daily.  . [DISCONTINUED] losartan (COZAAR) 100 MG tablet Take 1 tablet (100 mg total) by mouth daily. (Patient not taking: Reported on 05/05/2020)   No facility-administered encounter medications on file as of 05/05/2020.    Surgical History: Past Surgical History:  Procedure Laterality Date  . NO PAST SURGERIES      Medical History: Past Medical History:  Diagnosis Date  . Eczema   . ED (erectile dysfunction)   . Gout   . Gynecomastia, male left  . History of pyelonephritis Jan 2017  . Hyperlipidemia   . Hypertension   . Polycystic kidney disease   . Snoring     Family History: Family History  Problem Relation Age of Onset  . Hypertension Mother   . Kidney disease Father   . Hypertension Father   . Dementia Father   . Squamous cell carcinoma Father   . Cancer Father        SCC  . Diabetes Father   . Heart disease Father   . Cancer Brother        hodgkin's lymphoma  . Heart disease Brother 51       3 vessel CABG  . Heart disease Maternal Grandmother   . Stroke Maternal Grandmother   . Heart disease Maternal Grandfather   .  Stroke Maternal Grandfather   . Heart disease Paternal Grandmother   . Heart disease Paternal Grandfather     Social History   Socioeconomic History  . Marital status: Married    Spouse name: Not on file  . Number of children: Not on file  . Years of education: Not on file  . Highest education level: Not on file  Occupational History  . Not on file  Tobacco Use  . Smoking status: Never Smoker  . Smokeless tobacco: Current User    Types: Chew  Vaping Use  . Vaping Use: Never used  Substance and Sexual Activity  . Alcohol use: No  . Drug use: No  . Sexual activity: Yes  Other Topics Concern  . Not on  file  Social History Narrative  . Not on file   Social Determinants of Health   Financial Resource Strain: Not on file  Food Insecurity: Not on file  Transportation Needs: Not on file  Physical Activity: Not on file  Stress: Not on file  Social Connections: Not on file  Intimate Partner Violence: Not on file     Review of Systems  Constitutional: Negative for chills, fatigue and unexpected weight change.  HENT: Negative for congestion, postnasal drip, rhinorrhea, sneezing and sore throat.   Eyes: Negative for redness.  Respiratory: Negative for cough, chest tightness and shortness of breath.   Cardiovascular: Negative for chest pain and palpitations.       Elevated BP   Gastrointestinal: Negative for abdominal pain, constipation, diarrhea, nausea and vomiting.  Genitourinary: Negative for dysuria and frequency.       ED  Musculoskeletal: Negative for arthralgias, back pain, joint swelling and neck pain.  Skin: Negative for rash.  Neurological: Positive for headaches. Negative for tremors and numbness.  Hematological: Negative for adenopathy. Does not bruise/bleed easily.  Psychiatric/Behavioral: Negative for behavioral problems (Depression), sleep disturbance and suicidal ideas. The patient is not nervous/anxious.     Vital Signs: BP (!) 146/92   Pulse 75   Temp (!) 97.4 F (36.3 C)   Resp 16   Ht 6\' 2"  (1.88 m)   Wt 206 lb 12.8 oz (93.8 kg)   SpO2 98%   BMI 26.55 kg/m    Physical Exam Constitutional:      General: He is not in acute distress.    Appearance: He is well-developed and well-nourished. He is not diaphoretic.  HENT:     Head: Normocephalic and atraumatic.     Mouth/Throat:     Mouth: Oropharynx is clear and moist.     Pharynx: No oropharyngeal exudate.  Eyes:     Extraocular Movements: Extraocular movements intact and EOM normal.     Pupils: Pupils are equal, round, and reactive to light.  Neck:     Thyroid: No thyromegaly.     Vascular: No JVD.      Trachea: No tracheal deviation.  Cardiovascular:     Rate and Rhythm: Normal rate and regular rhythm.     Heart sounds: Normal heart sounds. No murmur heard. No friction rub. No gallop.   Pulmonary:     Effort: Pulmonary effort is normal. No respiratory distress.     Breath sounds: No wheezing or rales.  Chest:     Chest wall: No tenderness.  Abdominal:     General: Bowel sounds are normal.     Palpations: Abdomen is soft.  Musculoskeletal:        General: Normal range of motion.  Cervical back: Normal range of motion and neck supple.  Lymphadenopathy:     Cervical: No cervical adenopathy.  Skin:    General: Skin is warm and dry.  Neurological:     General: No focal deficit present.     Mental Status: He is alert and oriented to person, place, and time.     Cranial Nerves: No cranial nerve deficit.  Psychiatric:        Mood and Affect: Mood and affect normal.        Behavior: Behavior normal.        Thought Content: Thought content normal.        Judgment: Judgment normal.     Assessment/Plan: 1. Episodic cluster headache, not intractable Change in his symptoms, new onset elevated prolactin level, needs diagnostic study for better assessment and treatment. - MR Brain W Wo Contrast; Future  2. 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 Benign hypertension Uncontrolled, need to look into primary or sec cause, needs better control - losartan-hydrochlorothiazide (HYZAAR) 50-12.5 MG tablet; Take 1 tablet by mouth daily.  Dispense: 90 tablet; Refill: 3 Family h/o AAA, pt with no risk factors, however will monitor   5. Polycystic kidney  disease Followed by Nephrology  - potassium citrate (UROCIT-K) 5 MEQ (540 MG) SR tablet; Take 1 tablet (5 mEq total) by mouth daily.  Dispense: 90 tablet; Refill: 1  General Counseling: Travaris verbalizes understanding of the findings of todays visit and agrees with plan of treatment. I have discussed any further diagnostic evaluation that may be needed or ordered today. We also reviewed his medications today. he has been encouraged to call the office with any questions or concerns that should arise related to todays visit.    Orders Placed This Encounter  Procedures  . MR Brain W Wo Contrast  . TSH + free T4  . Testosterone,Free and Total  . FSH/LH  . Prolactin  . Comprehensive metabolic panel  . Fe+TIBC+Fer  . B12 and Folate Panel    Meds ordered this encounter  Medications  . amLODipine (NORVASC) 10 MG tablet    Sig: TAKE 1 TABLET(10 MG) BY MOUTH DAILY    Dispense:  90 tablet    Refill:  3  . losartan-hydrochlorothiazide (HYZAAR) 50-12.5 MG tablet    Sig: Take 1 tablet by mouth daily.    Dispense:  90 tablet    Refill:  3  . potassium citrate (UROCIT-K) 5 MEQ (540 MG) SR tablet    Sig: Take 1 tablet (5 mEq total) by mouth daily.    Dispense:  90 tablet    Refill:  1    Time spent: 30 Minutes

## 2020-05-06 ENCOUNTER — Ambulatory Visit: Payer: Self-pay | Admitting: Urology

## 2020-05-06 ENCOUNTER — Ambulatory Visit
Admission: RE | Admit: 2020-05-06 | Discharge: 2020-05-06 | Disposition: A | Discharge status: Home / Self Care | Payer: BC Managed Care – PPO | Source: Ambulatory Visit | Attending: Internal Medicine | Admitting: Internal Medicine

## 2020-05-06 ENCOUNTER — Other Ambulatory Visit: Payer: Self-pay

## 2020-05-06 DIAGNOSIS — D352 Benign neoplasm of pituitary gland: Secondary | ICD-10-CM | POA: Diagnosis not present

## 2020-05-06 DIAGNOSIS — G9389 Other specified disorders of brain: Secondary | ICD-10-CM | POA: Diagnosis not present

## 2020-05-06 DIAGNOSIS — E237 Disorder of pituitary gland, unspecified: Secondary | ICD-10-CM | POA: Diagnosis not present

## 2020-05-06 DIAGNOSIS — R609 Edema, unspecified: Secondary | ICD-10-CM | POA: Diagnosis not present

## 2020-05-06 DIAGNOSIS — G44019 Episodic cluster headache, not intractable: Secondary | ICD-10-CM | POA: Diagnosis not present

## 2020-05-06 MED ORDER — GADOBUTROL 1 MMOL/ML IV SOLN
9.0000 mL | Freq: Once | INTRAVENOUS | Status: AC | PRN
  Administered 2020-05-06: 9 mL via INTRAVENOUS

## 2020-05-06 NOTE — Progress Notes (Signed)
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

## 2020-05-07 NOTE — Addendum Note (Signed)
Addended by: Lavera Guise on: 05/07/2020 10:15 AM   Modules accepted: Orders

## 2020-05-14 ENCOUNTER — Other Ambulatory Visit: Payer: Self-pay

## 2020-05-14 ENCOUNTER — Other Ambulatory Visit
Admission: RE | Admit: 2020-05-14 | Discharge: 2020-05-14 | Disposition: A | Discharge status: Home / Self Care | Payer: BC Managed Care – PPO | Source: Ambulatory Visit | Attending: Gastroenterology | Admitting: Gastroenterology

## 2020-05-14 DIAGNOSIS — Z01812 Encounter for preprocedural laboratory examination: Secondary | ICD-10-CM | POA: Insufficient documentation

## 2020-05-14 DIAGNOSIS — Z20822 Contact with and (suspected) exposure to covid-19: Secondary | ICD-10-CM | POA: Insufficient documentation

## 2020-05-15 LAB — SARS CORONAVIRUS 2 (TAT 6-24 HRS): SARS Coronavirus 2: NEGATIVE

## 2020-05-18 ENCOUNTER — Ambulatory Visit
Admission: RE | Admit: 2020-05-18 | Discharge: 2020-05-18 | Disposition: A | Discharge status: Home / Self Care | Payer: BC Managed Care – PPO | Attending: Gastroenterology | Admitting: Gastroenterology

## 2020-05-18 ENCOUNTER — Ambulatory Visit: Payer: BC Managed Care – PPO | Admitting: Registered Nurse

## 2020-05-18 ENCOUNTER — Other Ambulatory Visit: Payer: Self-pay

## 2020-05-18 ENCOUNTER — Encounter
Admission: RE | Disposition: A | Discharge status: Home / Self Care | Payer: Self-pay | Source: Home / Self Care | Attending: Gastroenterology

## 2020-05-18 ENCOUNTER — Encounter: Payer: Self-pay | Admitting: Gastroenterology

## 2020-05-18 DIAGNOSIS — Z1211 Encounter for screening for malignant neoplasm of colon: Secondary | ICD-10-CM | POA: Diagnosis not present

## 2020-05-18 DIAGNOSIS — K635 Polyp of colon: Secondary | ICD-10-CM | POA: Diagnosis not present

## 2020-05-18 DIAGNOSIS — D122 Benign neoplasm of ascending colon: Secondary | ICD-10-CM | POA: Insufficient documentation

## 2020-05-18 HISTORY — PX: COLONOSCOPY WITH PROPOFOL: SHX5780

## 2020-05-18 SURGERY — COLONOSCOPY WITH PROPOFOL
Anesthesia: General

## 2020-05-18 MED ORDER — PROPOFOL 10 MG/ML IV BOLUS
INTRAVENOUS | Status: DC | PRN
  Administered 2020-05-18: 80 mg via INTRAVENOUS

## 2020-05-18 MED ORDER — PROPOFOL 500 MG/50ML IV EMUL
INTRAVENOUS | Status: DC | PRN
  Administered 2020-05-18: 160 ug/kg/min via INTRAVENOUS

## 2020-05-18 MED ORDER — SODIUM CHLORIDE 0.9 % IV SOLN: INTRAVENOUS | Status: DC | PRN

## 2020-05-18 NOTE — Transfer of Care (Signed)
Immediate Anesthesia Transfer of Care Note  Patient: David [name]  Procedure(s) Performed: COLONOSCOPY WITH PROPOFOL (N/A )  Patient Location: PACU  Anesthesia Type:General  Level of Consciousness: sedated  Airway & Oxygen Therapy: Patient Spontanous Breathing  Post-op Assessment: Report given to RN and Post -op Vital signs reviewed and stable  Post vital signs: Reviewed and stable  Last Vitals:  Vitals Value Taken Time  BP 113/68 05/18/20 1135  Temp    Pulse 57 05/18/20 1135  Resp 12 05/18/20 1135  SpO2 99 % 05/18/20 1135  Vitals shown include unvalidated device data.  Last Pain:  Vitals:   05/18/20 1047  TempSrc:   PainSc: 0-No pain         Complications: No complications documented.

## 2020-05-18 NOTE — Anesthesia Preprocedure Evaluation (Signed)
Anesthesia Evaluation  Patient identified by MRN, date of birth, ID band Patient awake    Reviewed: Allergy & Precautions, NPO status , Patient's Chart, lab work & pertinent test results  History of Anesthesia Complications Negative for: history of anesthetic complications  Airway Mallampati: II       Dental   Pulmonary neg sleep apnea, neg COPD, Not current smoker,           Cardiovascular hypertension, Pt. on medications (-) Past MI and (-) CHF (-) dysrhythmias (-) Valvular Problems/Murmurs     Neuro/Psych neg Seizures    GI/Hepatic Neg liver ROS, neg GERD  ,  Endo/Other  neg diabetes  Renal/GU Renal disease (polycystic kidney disease)     Musculoskeletal   Abdominal   Peds  Hematology   Anesthesia Other Findings   Reproductive/Obstetrics                             Anesthesia Physical Anesthesia Plan  ASA: II  Anesthesia Plan: General   Post-op Pain Management:    Induction: Intravenous  PONV Risk Score and Plan: 2 and Propofol infusion and TIVA  Airway Management Planned: Nasal Cannula  Additional Equipment:   Intra-op Plan:   Post-operative Plan:   Informed Consent: I have reviewed the patients History and Physical, chart, labs and discussed the procedure including the risks, benefits and alternatives for the proposed anesthesia with the patient or authorized representative who has indicated his/her understanding and acceptance.       Plan Discussed with:   Anesthesia Plan Comments:         Anesthesia Quick Evaluation

## 2020-05-18 NOTE — Op Note (Signed)
[facility] Patient Name: David [name] Procedure Date: 05/18/2020 11:07 AM MRN: [id number] Account #: [id number] Date of Birth: [birth year] Admit Type: Outpatient Age: 50 Room: [facility] ENDO ROOM 1 Gender: Male Note Status: Finalized Procedure:             Colonoscopy Indications:           Screening for colorectal malignant neoplasm, This is                         the patient's first colonoscopy Providers:             Lin Landsman MD, MD Referring MD:          Lavera Guise, MD (Referring MD) Medicines:             General Anesthesia Complications:         No immediate complications. Estimated blood loss: None. Procedure:             Pre-Anesthesia Assessment:                        - Prior to the procedure, a History and Physical was                         performed, and patient medications and allergies were                         reviewed. The patient is competent. The risks and                         benefits of the procedure and the sedation options and                         risks were discussed with the patient. All questions                         were answered and informed consent was obtained.                         Patient identification and proposed procedure were                         verified by the physician, the nurse, the                         anesthesiologist, the anesthetist and the technician                         in the pre-procedure area in the procedure room in the                         endoscopy suite. Mental Status Examination: alert and                         oriented. Airway Examination: normal oropharyngeal                         airway and neck mobility. Respiratory Examination:  clear to auscultation. CV Examination: normal.                         Prophylactic Antibiotics: The patient does not require                         prophylactic antibiotics. Prior Anticoagulants: The                          patient has taken no previous anticoagulant or                         antiplatelet agents. ASA Grade Assessment: II - A                         patient with mild systemic disease. After reviewing                         the risks and benefits, the patient was deemed in                         satisfactory condition to undergo the procedure. The                         anesthesia plan was to use general anesthesia.                         Immediately prior to administration of medications,                         the patient was re-assessed for adequacy to receive                         sedatives. The heart rate, respiratory rate, oxygen                         saturations, blood pressure, adequacy of pulmonary                         ventilation, and response to care were monitored                         throughout the procedure. The physical status of the                         patient was re-assessed after the procedure.                        After obtaining informed consent, the colonoscope was                         passed under direct vision. Throughout the procedure,                         the patient's blood pressure, pulse, and oxygen                         saturations were monitored continuously. The  Colonoscope was introduced through the anus and                         advanced to the the cecum, identified by appendiceal                         orifice and ileocecal valve. The colonoscopy was                         performed without difficulty. The patient tolerated                         the procedure well. The quality of the bowel                         preparation was adequate to identify polyps 6 mm and                         larger in size. Findings:      The perianal and digital rectal examinations were normal. Pertinent       negatives include normal sphincter tone and no palpable rectal lesions.      A 7 mm  polyp was found in the ascending colon. The polyp was flat.       Preparations were made for mucosal resection. NBI was done to mark the       borders of the lesion. Saline was injected to raise the lesion. Snare       mucosal resection was performed. Resection and retrieval were complete.      The retroflexed view of the distal rectum and anal verge was normal and       showed no anal or rectal abnormalities.      The exam was otherwise without abnormality. Impression:            - One 7 mm polyp in the ascending colon, removed with                         mucosal resection. Resected and retrieved.                        - The distal rectum and anal verge are normal on                         retroflexion view.                        - The examination was otherwise normal.                        - Mucosal resection was performed. Resection and                         retrieval were complete. Recommendation:        - Discharge patient to home (with escort).                        - Resume previous diet today.                        - Continue  present medications.                        - Await pathology results.                        - Repeat colonoscopy in 5 years for surveillance. Procedure Code(s):     --- Professional ---                        [phone], Colonoscopy, flexible; with endoscopic mucosal                         resection Diagnosis Code(s):     --- Professional ---                        Z12.11, Encounter for screening for malignant neoplasm                         of colon                        K63.5, Polyp of colon CPT copyright 2019 American Medical Association. All rights reserved. The codes documented in this report are preliminary and upon coder review may  be revised to meet current compliance requirements. Dr. Ulyess Mort Lin Landsman MD, MD 05/18/2020 11:34:42 AM This report has been signed electronically. Number of Addenda: 0 Note Initiated On:  05/18/2020 11:07 AM Scope Withdrawal Time: 0 hours 13 minutes 52 seconds  Total Procedure Duration: 0 hours 17 minutes 41 seconds  Estimated Blood Loss:  Estimated blood loss: none.      [facility]

## 2020-05-18 NOTE — H&P (Signed)
David Darby, MD [address]  [place]  [place], [place] [address]  Main: [phone]  Fax: [phone] Pager: [phone]  Primary Care Physician:  Lavera Guise, MD Primary Gastroenterologist:  Dr. Cephas [name]  Pre-Procedure History & Physical: HPI:  David [name] is a 50 y.o. male is here for an colonoscopy.   Past Medical History:  Diagnosis Date  . Eczema   . ED (erectile dysfunction)   . Gout   . Gynecomastia, male left  . History of pyelonephritis Jan 2017  . Hyperlipidemia   . Hypertension   . Polycystic kidney disease   . Snoring     Past Surgical History:  Procedure Laterality Date  . NO PAST SURGERIES      Prior to Admission medications   Medication Sig Start Date End Date Taking? Authorizing Provider  amLODipine (NORVASC) 10 MG tablet TAKE 1 TABLET(10 MG) BY MOUTH DAILY 05/05/20   Lavera Guise, MD  Efinaconazole 10 % SOLN Apply 1 application topically daily. Patient not taking: Reported on 05/05/2020 07/30/18   Fredderick Severance, NP  losartan-hydrochlorothiazide (HYZAAR) 50-12.5 MG tablet Take 1 tablet by mouth daily. 05/05/20   Lavera Guise, MD  potassium citrate (UROCIT-K) 5 MEQ (540 MG) SR tablet Take 1 tablet (5 mEq total) by mouth daily. 05/05/20   Lavera Guise, MD  tadalafil (CIALIS) 20 MG tablet Take 1 tablet (20 mg total) by mouth every other day. 04/02/20   Billey Co, MD  Testosterone (ANDROGEL) 20.25 MG/1.25GM (1.62%) GEL Place 2 Pump onto the skin daily. 04/30/20   Zara Council A, PA-C    Allergies as of 04/28/2020  . (No Known Allergies)    Family History  Problem Relation Age of Onset  . Hypertension Mother   . Kidney disease Father   . Hypertension Father   . Dementia Father   . Squamous cell carcinoma Father   . Cancer Father        SCC  . Diabetes Father   . Heart disease Father   . Cancer Brother        hodgkin's lymphoma  . Heart disease Brother 37       3 vessel CABG  . Heart disease Maternal  Grandmother   . Stroke Maternal Grandmother   . Heart disease Maternal Grandfather   . Stroke Maternal Grandfather   . Heart disease Paternal Grandmother   . Heart disease Paternal Grandfather     Social History   Socioeconomic History  . Marital status: Married    Spouse name: Not on file  . Number of children: Not on file  . Years of education: Not on file  . Highest education level: Not on file  Occupational History  . Not on file  Tobacco Use  . Smoking status: Never Smoker  . Smokeless tobacco: Current User    Types: Chew  Vaping Use  . Vaping Use: Never used  Substance and Sexual Activity  . Alcohol use: No  . Drug use: No  . Sexual activity: Yes  Other Topics Concern  . Not on file  Social History Narrative  . Not on file   Social Determinants of Health   Financial Resource Strain: Not on file  Food Insecurity: Not on file  Transportation Needs: Not on file  Physical Activity: Not on file  Stress: Not on file  Social Connections: Not on file  Intimate Partner Violence: Not on file    Review of Systems: See  HPI, otherwise negative ROS  Physical Exam: BP 120/83   Ht 6\' 2"  (1.88 m)   Wt 91.8 kg   SpO2 100%   BMI 25.99 kg/m  General:   Alert,  pleasant and cooperative in NAD Head:  Normocephalic and atraumatic. Neck:  Supple; no masses or thyromegaly. Lungs:  Clear throughout to auscultation.    Heart:  Regular rate and rhythm. Abdomen:  Soft, nontender and nondistended. Normal bowel sounds, without guarding, and without rebound.   Neurologic:  Alert and  oriented x4;  grossly normal neurologically.  Impression/Plan: David [name] is here for an colonoscopy to be performed for screening colon cancer  Risks, benefits, limitations, and alternatives regarding  colonoscopy have been reviewed with the patient.  Questions have been answered.  All parties agreeable.   Sherri Sear, MD  05/18/2020, 11:09 AM

## 2020-05-18 NOTE — Anesthesia Postprocedure Evaluation (Signed)
Anesthesia Post Note  Patient: David [name]  Procedure(s) Performed: COLONOSCOPY WITH PROPOFOL (N/A )  Patient location during evaluation: Endoscopy Anesthesia Type: General Level of consciousness: awake and alert Pain management: pain level controlled Vital Signs Assessment: post-procedure vital signs reviewed and stable Respiratory status: spontaneous breathing and respiratory function stable Cardiovascular status: stable Anesthetic complications: no   No complications documented.   Last Vitals:  Vitals:   05/18/20 1150 05/18/20 1200  BP: (!) 130/95 132/84  Pulse: 61 (!) 104  Resp: 16 12  Temp:    SpO2: 100% 100%    Last Pain:  Vitals:   05/18/20 1150  TempSrc:   PainSc: 0-No pain                 June Vacha K

## 2020-05-19 ENCOUNTER — Encounter: Payer: Self-pay | Admitting: Gastroenterology

## 2020-05-19 LAB — SURGICAL PATHOLOGY

## 2020-05-21 DIAGNOSIS — I10 Essential (primary) hypertension: Secondary | ICD-10-CM | POA: Diagnosis not present

## 2020-05-21 DIAGNOSIS — D497 Neoplasm of unspecified behavior of endocrine glands and other parts of nervous system: Secondary | ICD-10-CM | POA: Diagnosis not present

## 2020-05-21 DIAGNOSIS — Q613 Polycystic kidney, unspecified: Secondary | ICD-10-CM | POA: Diagnosis not present

## 2020-05-21 DIAGNOSIS — N529 Male erectile dysfunction, unspecified: Secondary | ICD-10-CM | POA: Diagnosis not present

## 2020-05-21 DIAGNOSIS — E221 Hyperprolactinemia: Secondary | ICD-10-CM | POA: Diagnosis not present

## 2020-05-21 DIAGNOSIS — E291 Testicular hypofunction: Secondary | ICD-10-CM | POA: Diagnosis not present

## 2020-05-27 ENCOUNTER — Ambulatory Visit (INDEPENDENT_AMBULATORY_CARE_PROVIDER_SITE_OTHER): Payer: BC Managed Care – PPO

## 2020-05-27 DIAGNOSIS — E291 Testicular hypofunction: Secondary | ICD-10-CM | POA: Diagnosis not present

## 2020-05-27 DIAGNOSIS — D497 Neoplasm of unspecified behavior of endocrine glands and other parts of nervous system: Secondary | ICD-10-CM | POA: Diagnosis not present

## 2020-05-27 DIAGNOSIS — E221 Hyperprolactinemia: Secondary | ICD-10-CM | POA: Diagnosis not present

## 2020-05-27 DIAGNOSIS — E611 Iron deficiency: Secondary | ICD-10-CM | POA: Diagnosis not present

## 2020-05-27 DIAGNOSIS — E538 Deficiency of other specified B group vitamins: Secondary | ICD-10-CM

## 2020-05-27 NOTE — Progress Notes (Signed)
Pt was here today for labs drawn

## 2020-05-28 LAB — IRON,TIBC AND FERRITIN PANEL
Ferritin: 206 ng/mL (ref 30–400)
Iron Saturation: 27 % (ref 15–55)
Iron: 88 ug/dL (ref 38–169)
Total Iron Binding Capacity: 328 ug/dL (ref 250–450)
UIBC: 240 ug/dL (ref 111–343)

## 2020-05-28 LAB — B12 AND FOLATE PANEL
Folate: 7.9 ng/mL (ref >3.0)
Vitamin B-12: 392 pg/mL (ref 232–1245)

## 2020-05-28 NOTE — Progress Notes (Signed)
Labs look WNL, low normal B12.can start OTC supplement

## 2020-06-01 ENCOUNTER — Other Ambulatory Visit: Payer: Self-pay

## 2020-06-01 DIAGNOSIS — E349 Endocrine disorder, unspecified: Secondary | ICD-10-CM

## 2020-06-02 ENCOUNTER — Other Ambulatory Visit: Payer: Self-pay

## 2020-06-05 ENCOUNTER — Encounter: Payer: Self-pay | Admitting: Urology

## 2020-07-01 DIAGNOSIS — D497 Neoplasm of unspecified behavior of endocrine glands and other parts of nervous system: Secondary | ICD-10-CM | POA: Diagnosis not present

## 2020-07-01 DIAGNOSIS — E221 Hyperprolactinemia: Secondary | ICD-10-CM | POA: Diagnosis not present

## 2020-07-01 DIAGNOSIS — N529 Male erectile dysfunction, unspecified: Secondary | ICD-10-CM | POA: Diagnosis not present

## 2020-07-01 DIAGNOSIS — I10 Essential (primary) hypertension: Secondary | ICD-10-CM | POA: Diagnosis not present

## 2020-07-01 DIAGNOSIS — E291 Testicular hypofunction: Secondary | ICD-10-CM | POA: Diagnosis not present

## 2020-07-01 DIAGNOSIS — Q613 Polycystic kidney, unspecified: Secondary | ICD-10-CM | POA: Diagnosis not present

## 2020-07-08 DIAGNOSIS — E291 Testicular hypofunction: Secondary | ICD-10-CM | POA: Diagnosis not present

## 2020-07-08 DIAGNOSIS — D497 Neoplasm of unspecified behavior of endocrine glands and other parts of nervous system: Secondary | ICD-10-CM | POA: Diagnosis not present

## 2020-07-08 DIAGNOSIS — I10 Essential (primary) hypertension: Secondary | ICD-10-CM | POA: Diagnosis not present

## 2020-07-08 DIAGNOSIS — E221 Hyperprolactinemia: Secondary | ICD-10-CM | POA: Diagnosis not present

## 2020-07-08 DIAGNOSIS — Z7189 Other specified counseling: Secondary | ICD-10-CM | POA: Diagnosis not present

## 2020-08-28 ENCOUNTER — Telehealth: Payer: Self-pay

## 2020-08-28 ENCOUNTER — Other Ambulatory Visit: Payer: Self-pay

## 2020-08-28 MED ORDER — FLUCONAZOLE 150 MG PO TABS: ORAL_TABLET | ORAL | 0 refills | Status: DC

## 2020-08-28 NOTE — Telephone Encounter (Signed)
As per dr Humphrey Rolls send 10 tab for diflucan

## 2020-10-15 DIAGNOSIS — I10 Essential (primary) hypertension: Secondary | ICD-10-CM | POA: Diagnosis not present

## 2020-10-15 DIAGNOSIS — E221 Hyperprolactinemia: Secondary | ICD-10-CM | POA: Diagnosis not present

## 2020-10-15 DIAGNOSIS — Q613 Polycystic kidney, unspecified: Secondary | ICD-10-CM | POA: Diagnosis not present

## 2020-10-15 DIAGNOSIS — D497 Neoplasm of unspecified behavior of endocrine glands and other parts of nervous system: Secondary | ICD-10-CM | POA: Diagnosis not present

## 2020-10-15 DIAGNOSIS — E291 Testicular hypofunction: Secondary | ICD-10-CM | POA: Diagnosis not present

## 2020-10-27 DIAGNOSIS — E291 Testicular hypofunction: Secondary | ICD-10-CM | POA: Diagnosis not present

## 2020-10-27 DIAGNOSIS — Z7189 Other specified counseling: Secondary | ICD-10-CM | POA: Diagnosis not present

## 2020-10-27 DIAGNOSIS — E221 Hyperprolactinemia: Secondary | ICD-10-CM | POA: Diagnosis not present

## 2020-10-27 DIAGNOSIS — D497 Neoplasm of unspecified behavior of endocrine glands and other parts of nervous system: Secondary | ICD-10-CM | POA: Diagnosis not present

## 2020-10-27 DIAGNOSIS — I10 Essential (primary) hypertension: Secondary | ICD-10-CM | POA: Diagnosis not present

## 2020-11-15 ENCOUNTER — Other Ambulatory Visit: Payer: Self-pay | Admitting: Internal Medicine

## 2020-11-15 DIAGNOSIS — Q613 Polycystic kidney, unspecified: Secondary | ICD-10-CM

## 2020-11-16 NOTE — Telephone Encounter (Signed)
Pt is no longer on this med 

## 2020-11-16 NOTE — Telephone Encounter (Signed)
Can you find out if he is taking this

## 2020-11-23 ENCOUNTER — Other Ambulatory Visit: Payer: Self-pay | Admitting: Internal Medicine

## 2020-11-23 ENCOUNTER — Telehealth: Payer: Self-pay

## 2020-11-23 DIAGNOSIS — Q613 Polycystic kidney, unspecified: Secondary | ICD-10-CM

## 2020-11-23 NOTE — Telephone Encounter (Signed)
LMOM for pt to return call to see if pt is taking medication

## 2020-11-23 NOTE — Telephone Encounter (Signed)
Called and spoke to pt and he advised he is still taking the potassium citrate 5 MEQ

## 2020-12-13 ENCOUNTER — Encounter: Payer: Self-pay | Admitting: Internal Medicine

## 2020-12-14 ENCOUNTER — Other Ambulatory Visit: Payer: Self-pay | Admitting: Internal Medicine

## 2020-12-14 ENCOUNTER — Encounter: Payer: BC Managed Care – PPO | Admitting: Nurse Practitioner

## 2020-12-14 MED ORDER — MONTELUKAST SODIUM 10 MG PO TABS: 10.0000 mg | ORAL_TABLET | Freq: Every day | ORAL | 3 refills | Status: DC

## 2021-01-07 ENCOUNTER — Telehealth: Payer: Self-pay

## 2021-01-07 NOTE — Telephone Encounter (Signed)
Left vm to confirm 01/11/21 appointment-Toni

## 2021-01-11 ENCOUNTER — Encounter: Payer: BC Managed Care – PPO | Admitting: Nurse Practitioner

## 2021-02-26 DIAGNOSIS — E291 Testicular hypofunction: Secondary | ICD-10-CM | POA: Diagnosis not present

## 2021-02-26 DIAGNOSIS — I10 Essential (primary) hypertension: Secondary | ICD-10-CM | POA: Diagnosis not present

## 2021-02-26 DIAGNOSIS — E221 Hyperprolactinemia: Secondary | ICD-10-CM | POA: Diagnosis not present

## 2021-02-26 DIAGNOSIS — D497 Neoplasm of unspecified behavior of endocrine glands and other parts of nervous system: Secondary | ICD-10-CM | POA: Diagnosis not present

## 2021-03-03 DIAGNOSIS — E221 Hyperprolactinemia: Secondary | ICD-10-CM | POA: Diagnosis not present

## 2021-03-03 DIAGNOSIS — Q613 Polycystic kidney, unspecified: Secondary | ICD-10-CM | POA: Diagnosis not present

## 2021-03-03 DIAGNOSIS — Z7189 Other specified counseling: Secondary | ICD-10-CM | POA: Diagnosis not present

## 2021-03-03 DIAGNOSIS — I10 Essential (primary) hypertension: Secondary | ICD-10-CM | POA: Diagnosis not present

## 2021-03-03 DIAGNOSIS — D497 Neoplasm of unspecified behavior of endocrine glands and other parts of nervous system: Secondary | ICD-10-CM | POA: Diagnosis not present

## 2021-05-20 ENCOUNTER — Other Ambulatory Visit: Payer: Self-pay | Admitting: Internal Medicine

## 2021-05-20 DIAGNOSIS — I10 Essential (primary) hypertension: Secondary | ICD-10-CM

## 2021-05-21 ENCOUNTER — Other Ambulatory Visit: Payer: Self-pay | Admitting: Internal Medicine

## 2021-05-21 DIAGNOSIS — Q613 Polycystic kidney, unspecified: Secondary | ICD-10-CM

## 2021-05-27 ENCOUNTER — Other Ambulatory Visit: Payer: Self-pay

## 2021-05-27 DIAGNOSIS — I10 Essential (primary) hypertension: Secondary | ICD-10-CM

## 2021-05-27 MED ORDER — LOSARTAN POTASSIUM-HCTZ 50-12.5 MG PO TABS: 1.0000 | ORAL_TABLET | Freq: Every day | ORAL | 0 refills | Status: DC

## 2021-06-29 ENCOUNTER — Other Ambulatory Visit: Payer: Self-pay

## 2021-06-29 DIAGNOSIS — I151 Hypertension secondary to other renal disorders: Secondary | ICD-10-CM

## 2021-06-29 MED ORDER — AMLODIPINE BESYLATE 10 MG PO TABS: ORAL_TABLET | ORAL | 0 refills | Status: DC

## 2022-07-28 ENCOUNTER — Encounter: Payer: Self-pay | Admitting: Physician Assistant

## 2022-07-28 ENCOUNTER — Ambulatory Visit: Payer: Self-pay | Admitting: Physician Assistant

## 2022-07-28 VITALS — BP 118/76 | HR 61 | Temp 98.3°F | Resp 16 | Ht 73.0 in | Wt 219.0 lb

## 2022-07-28 DIAGNOSIS — Q613 Polycystic kidney, unspecified: Secondary | ICD-10-CM

## 2022-07-28 DIAGNOSIS — D352 Benign neoplasm of pituitary gland: Secondary | ICD-10-CM

## 2022-07-28 DIAGNOSIS — E291 Testicular hypofunction: Secondary | ICD-10-CM

## 2022-07-28 DIAGNOSIS — C4490 Unspecified malignant neoplasm of skin, unspecified: Secondary | ICD-10-CM

## 2022-07-28 DIAGNOSIS — I151 Hypertension secondary to other renal disorders: Secondary | ICD-10-CM

## 2022-07-28 DIAGNOSIS — C642 Malignant neoplasm of left kidney, except renal pelvis: Secondary | ICD-10-CM

## 2022-07-28 DIAGNOSIS — E038 Other specified hypothyroidism: Secondary | ICD-10-CM

## 2022-07-28 NOTE — Progress Notes (Signed)
[facility] [address] [place], [place] [address]  Internal MEDICINE  Office Visit Note  Patient Name: David Littlesom S [name]  [id number] 12, 2072  [id number]  Date of Service: 07/28/2022  Chief Complaint  Patient presents with   Follow-up   Hyperlipidemia   Hypertension   Referral    HPI Pt is here for follow up after recently moving back into the area -He is here with his wife today and is in need of several referrals to re-establish care -He has a hx of polycystic kidney disease as well as renal cell carcinoma, status post left nephrectomy in October 2023.  Follow-up CT abdomen in March of 2024 showed no evidence of recurrence and had a normal chest x-ray.  He is in need of urology and nephrology referrals for continued management - Patient also has a history of prolactinoma and is on cabergoline 2 times weekly and was being followed by endocrinology and needs to establish with local endocrinologist.  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 -Will scan him copies of previous providers records as well as labs -Also has a history of basal cell cancer and had mohs surgery on his right ear in March 2024.  Will need to establish for routine skin checks with dermatologist locally -Patient also has a history of hypertension that has been well-controlled on losartan-hydrochlorothiazide plus amlodipine  Current Medication: Outpatient Encounter Medications as of 07/28/2022  Medication Sig   amLODipine (NORVASC) 10 MG tablet TAKE 1 TABLET(10 MG) BY MOUTH DAILY   cabergoline (DOSTINEX) 0.5 MG tablet Take 0.25 mg by mouth 2 (two) times a week.   levocetirizine (XYZAL) 5 MG tablet Take 5 mg by mouth every evening.   levothyroxine (SYNTHROID) 25 MCG tablet Take 25 mcg by mouth daily before breakfast. Patient taking 0.375 mg daily   losartan-hydrochlorothiazide (HYZAAR) 50-12.5 MG tablet Take 1 tablet by mouth daily.   montelukast (SINGULAIR) 10 MG tablet Take 1 tablet  (10 mg total) by mouth at bedtime.   potassium citrate (UROCIT-K) 5 MEQ (540 MG) SR tablet TAKE ONE TABLET BY MOUTH DAILY   Testosterone (ANDROGEL) 20.25 MG/1.25GM (1.62%) GEL Place 2 Pump onto the skin daily.   [DISCONTINUED] fluconazole (DIFLUCAN) 150 MG tablet Take 1 tab  po QD for 3 days and then once a week PRN   [DISCONTINUED] tadalafil (CIALIS) 20 MG tablet Take 1 tablet (20 mg total) by mouth every other day.   No facility-administered encounter medications on file as of 07/28/2022.    Surgical History: Past Surgical History:  Procedure Laterality Date   COLONOSCOPY WITH PROPOFOL N/A 05/18/2020   Procedure: COLONOSCOPY WITH PROPOFOL;  Surgeon: Toney ReilVanga, Rohini Reddy, MD;  [facility];  Service: Gastroenterology;  Laterality: N/A;   NO PAST SURGERIES      Medical History: Past Medical History:  Diagnosis Date   Eczema    ED (erectile dysfunction)    Gout    Gynecomastia, male left   History of pyelonephritis Jan 2017   Hyperlipidemia    Hypertension    Polycystic kidney disease    Snoring     Family History: Family History  Problem Relation Age of Onset   Hypertension Mother    Kidney disease Father    Hypertension Father    Dementia Father    Squamous cell carcinoma Father    Cancer Father        SCC   Diabetes Father    Heart disease Father    Cancer Brother  hodgkin's lymphoma   Heart disease Brother 34       3 vessel CABG   Heart disease Maternal Grandmother    Stroke Maternal Grandmother    Heart disease Maternal Grandfather    Stroke Maternal Grandfather    Heart disease Paternal Grandmother    Heart disease Paternal Grandfather     Social History   Socioeconomic History   Marital status: Married    Spouse name: Not on file   Number of children: Not on file   Years of education: Not on file   Highest education level: Not on file  Occupational History   Not on file  Tobacco Use   Smoking status: Never   Smokeless tobacco:  Current    Types: Chew  Vaping Use   Vaping Use: Never used  Substance and Sexual Activity   Alcohol use: No   Drug use: No   Sexual activity: Yes  Other Topics Concern   Not on file  Social History Narrative   Not on file   Social Determinants of Health   Financial Resource Strain: Not on file  Food Insecurity: Not on file  Transportation Needs: Not on file  Physical Activity: Not on file  Stress: Not on file  Social Connections: Not on file  Intimate Partner Violence: Not on file      Review of Systems  Constitutional:  Negative for chills, fatigue and unexpected weight change.  HENT:  Negative for congestion, rhinorrhea, sneezing and sore throat.   Eyes:  Negative for redness.  Respiratory:  Negative for cough, chest tightness and shortness of breath.   Cardiovascular:  Negative for chest pain and palpitations.  Gastrointestinal:  Negative for abdominal pain, constipation, diarrhea, nausea and vomiting.  Genitourinary:  Negative for dysuria and frequency.  Musculoskeletal:  Negative for arthralgias, back pain, joint swelling and neck pain.  Skin:  Negative for rash.  Neurological: Negative.  Negative for tremors and numbness.  Hematological:  Negative for adenopathy. Does not bruise/bleed easily.  Psychiatric/Behavioral:  Negative for behavioral problems (Depression), sleep disturbance and suicidal ideas. The patient is not nervous/anxious.     Vital Signs: BP 118/76   Pulse 61   Temp 98.3 F (36.8 C)   Resp 16   Ht 6\' 1"  (1.854 m)   Wt 219 lb (99.3 kg)   SpO2 97%   BMI 28.89 kg/m    Physical Exam Vitals and nursing note reviewed.  Constitutional:      General: He is not in acute distress.    Appearance: Normal appearance. He is well-developed. He is not diaphoretic.  HENT:     Head: Normocephalic and atraumatic.     Mouth/Throat:     Pharynx: No oropharyngeal exudate.  Eyes:     Pupils: Pupils are equal, round, and reactive to light.  Neck:      Thyroid: No thyromegaly.     Vascular: No JVD.     Trachea: No tracheal deviation.  Cardiovascular:     Rate and Rhythm: Normal rate and regular rhythm.     Heart sounds: Normal heart sounds. No murmur heard.    No friction rub. No gallop.  Pulmonary:     Effort: Pulmonary effort is normal. No respiratory distress.     Breath sounds: No wheezing or rales.  Chest:     Chest wall: No tenderness.  Abdominal:     General: Bowel sounds are normal.     Palpations: Abdomen is soft.  Musculoskeletal:  General: Normal range of motion.     Cervical back: Normal range of motion and neck supple.  Lymphadenopathy:     Cervical: No cervical adenopathy.  Skin:    General: Skin is warm and dry.  Neurological:     Mental Status: He is alert and oriented to person, place, and time.     Cranial Nerves: No cranial nerve deficit.  Psychiatric:        Behavior: Behavior normal.        Thought Content: Thought content normal.        Judgment: Judgment normal.        Assessment/Plan: 1. Renal cell carcinoma of left kidney Will send new referrals to establish locally - Ambulatory referral to Urology - Ambulatory referral to Nephrology  2. Polycystic kidney disease - Ambulatory referral to Urology - Ambulatory referral to Nephrology  3. Central hypothyroidism - Ambulatory referral to Endocrinology  4. Skin cancer - Ambulatory referral to Dermatology  5. Pituitary adenoma - Ambulatory referral to Endocrinology  6. Testicular hypofunction - Ambulatory referral to Endocrinology  7. Hypertension secondary to other renal disorders Well controlled, continue current medications   General Counseling: Areg verbalizes understanding of the findings of todays visit and agrees with plan of treatment. I have discussed any further diagnostic evaluation that may be needed or ordered today. We also reviewed his medications today. he has been encouraged to call the office with any questions or  concerns that should arise related to todays visit.    Orders Placed This Encounter  Procedures   Ambulatory referral to Urology   Ambulatory referral to Nephrology   Ambulatory referral to Endocrinology   Ambulatory referral to Dermatology    No orders of the defined types were placed in this encounter.   This patient was seen by Lynn Ito, PA-C in collaboration with Dr. Beverely Risen as a part of collaborative care agreement.   Total time spent:30 Minutes Time spent includes review of chart, medications, test results, and follow up plan with the patient.      Dr Lyndon Code Internal medicine

## 2022-08-02 ENCOUNTER — Telehealth: Payer: Self-pay | Admitting: Physician Assistant

## 2022-08-02 ENCOUNTER — Telehealth: Payer: Self-pay | Admitting: Internal Medicine

## 2022-08-02 NOTE — Telephone Encounter (Signed)
Per wife's request, Nephrology referral faxed to [facility] in [place]; [phone]

## 2022-08-02 NOTE — Telephone Encounter (Signed)
Received fax back from Dr.Morayati requesting labs to be done for referral. Sent message to Lauren-Toni

## 2022-08-02 NOTE — Telephone Encounter (Signed)
Endocrinology referral faxed to Dr. Morayati ; [phone]-Toni 

## 2022-08-02 NOTE — Telephone Encounter (Signed)
Per wife's request, Urology referral faxed to [facility] in [place]; [phone]

## 2022-08-02 NOTE — Telephone Encounter (Signed)
Per patient's request, Endocrinology referral faxed to [facility]; [phone]

## 2022-08-02 NOTE — Telephone Encounter (Signed)
Nephrology referral sent via [facility] to [facility] -Toni 

## 2022-08-02 NOTE — Telephone Encounter (Signed)
Dermatology referral sent via [facility] to [facility]-Toni 

## 2022-08-03 ENCOUNTER — Telehealth: Payer: Self-pay | Admitting: Physician Assistant

## 2022-08-03 NOTE — Telephone Encounter (Signed)
Dermatology appointment>> 07/26/2023 @ [facility]. Redirected referral to [facility] to possibly get patient in sooner. Notified spouse-Toni

## 2022-08-10 ENCOUNTER — Telehealth: Payer: Self-pay | Admitting: Physician Assistant

## 2022-08-10 NOTE — Telephone Encounter (Signed)
Dermatology appointment>> 11/08/2022 @ [facility] -Sheralyn Boatman

## 2022-08-15 ENCOUNTER — Telehealth: Payer: Self-pay | Admitting: Physician Assistant

## 2022-08-15 NOTE — Telephone Encounter (Signed)
Endocrinology referral faxed to [facility] again;  [phone]

## 2022-08-17 ENCOUNTER — Telehealth: Payer: Self-pay | Admitting: Physician Assistant

## 2022-08-17 NOTE — Telephone Encounter (Signed)
Received call from patient's wife requesting Endocrinology referral now be sent back to Dr. Patrecia Pace. Faxed referral again; [phone]

## 2022-08-19 ENCOUNTER — Other Ambulatory Visit: Payer: Self-pay | Admitting: Physician Assistant

## 2022-08-19 ENCOUNTER — Telehealth: Payer: Self-pay | Admitting: Physician Assistant

## 2022-08-19 DIAGNOSIS — E038 Other specified hypothyroidism: Secondary | ICD-10-CM | POA: Diagnosis not present

## 2022-08-19 DIAGNOSIS — D352 Benign neoplasm of pituitary gland: Secondary | ICD-10-CM | POA: Diagnosis not present

## 2022-08-19 DIAGNOSIS — E291 Testicular hypofunction: Secondary | ICD-10-CM

## 2022-08-19 NOTE — Telephone Encounter (Signed)
Notified patient's wife that additional lab work has been ordered. She will have patient get done-Toni

## 2022-08-20 LAB — PROLACTIN: Prolactin: 4.6 ng/mL (ref 3.6–25.2)

## 2022-08-20 LAB — TSH+FREE T4
Free T4: 1.31 ng/dL (ref 0.82–1.77)
TSH: 0.598 u[IU]/mL (ref 0.450–4.500)

## 2022-08-20 LAB — TESTOSTERONE,FREE AND TOTAL: Testosterone: 345 ng/dL (ref 264–916)

## 2022-08-26 ENCOUNTER — Telehealth: Payer: Self-pay | Admitting: Physician Assistant

## 2022-08-26 LAB — TESTOSTERONE,FREE AND TOTAL: Testosterone, Free: 11.7 pg/mL (ref 7.2–24.0)

## 2022-08-26 NOTE — Telephone Encounter (Signed)
Labs faxed to Dr. Patrecia Pace for endo referral-Toni

## 2022-08-29 ENCOUNTER — Encounter: Payer: Self-pay | Admitting: Physician Assistant

## 2022-08-29 ENCOUNTER — Telehealth: Payer: Self-pay | Admitting: Physician Assistant

## 2022-08-29 ENCOUNTER — Ambulatory Visit: Payer: BC Managed Care – PPO | Admitting: Physician Assistant

## 2022-08-29 VITALS — BP 118/80 | HR 69 | Temp 98.4°F | Resp 16 | Ht 73.0 in | Wt 222.4 lb

## 2022-08-29 DIAGNOSIS — D352 Benign neoplasm of pituitary gland: Secondary | ICD-10-CM

## 2022-08-29 DIAGNOSIS — C642 Malignant neoplasm of left kidney, except renal pelvis: Secondary | ICD-10-CM

## 2022-08-29 DIAGNOSIS — E349 Endocrine disorder, unspecified: Secondary | ICD-10-CM | POA: Diagnosis not present

## 2022-08-29 DIAGNOSIS — I10 Essential (primary) hypertension: Secondary | ICD-10-CM

## 2022-08-29 DIAGNOSIS — E538 Deficiency of other specified B group vitamins: Secondary | ICD-10-CM

## 2022-08-29 DIAGNOSIS — R413 Other amnesia: Secondary | ICD-10-CM

## 2022-08-29 MED ORDER — LOSARTAN POTASSIUM-HCTZ 50-12.5 MG PO TABS
1.0000 | ORAL_TABLET | Freq: Every day | ORAL | 1 refills | Status: DC
Start: 2022-08-29 — End: 2022-12-12

## 2022-08-29 MED ORDER — TADALAFIL 20 MG PO TABS: 20.0000 mg | ORAL_TABLET | Freq: Every day | ORAL | 3 refills | Status: DC | PRN

## 2022-08-29 MED ORDER — TESTOSTERONE 20.25 MG/1.25GM (1.62%) TD GEL
2.0000 | Freq: Every day | TRANSDERMAL | 3 refills | Status: AC
Start: 2022-08-29 — End: ?

## 2022-08-29 NOTE — Progress Notes (Signed)
[facility] [address] [place], [place] [address]  Internal MEDICINE  Office Visit Note  Patient Name: David [name]  [id number]  [id number]  Date of Service: 08/30/2022  Chief Complaint  Patient presents with   Follow-up   Memory Loss    HPI Pt is for routine follow up -Having some memory concerns. Short term memory problems since last fall.  Unclear if related to RCC or pituitary tumor. Per wife, pt forgets phone at home, is forgetting recent events, conversations, and having trouble learning new info. Given history will refer to neurology despite normal MMSE in office today. -Pt's wife would like to go ahead and check B12 and BMP as well while awaiting his other referrals -Does also need some refills today until he can get in with endo for future refills. She states the testosterone may require having a PA done and pt understands that this can delay when he can pick up from pharmacy. Recent labs show adequate replacement     08/29/2022    1:26 PM  MMSE - Mini Mental State Exam  Orientation to time 5  Orientation to Place 5  Registration 3  Attention/ Calculation 5  Recall 3  Language- name 2 objects 2  Language- repeat 1  Language- follow 3 step command 3  Language- read & follow direction 1  Write a sentence 1  Copy design 1  Total score 30      Current Medication: Outpatient Encounter Medications as of 08/29/2022  Medication Sig   amLODipine (NORVASC) 10 MG tablet TAKE 1 TABLET(10 MG) BY MOUTH DAILY   cabergoline (DOSTINEX) 0.5 MG tablet Take 0.25 mg by mouth 2 (two) times a week.   levocetirizine (XYZAL) 5 MG tablet Take 5 mg by mouth every evening.   levothyroxine (SYNTHROID) 25 MCG tablet Take 25 mcg by mouth daily before breakfast. Patient taking 0.375 mg daily   montelukast (SINGULAIR) 10 MG tablet Take 1 tablet (10 mg total) by mouth at bedtime.   potassium citrate (UROCIT-K) 5 MEQ (540 MG) SR tablet TAKE ONE TABLET BY MOUTH DAILY   tadalafil  (CIALIS) 20 MG tablet Take 1 tablet (20 mg total) by mouth daily as needed for erectile dysfunction.   [DISCONTINUED] losartan-hydrochlorothiazide (HYZAAR) 50-12.5 MG tablet Take 1 tablet by mouth daily.   [DISCONTINUED] Testosterone (ANDROGEL) 20.25 MG/1.25GM (1.62%) GEL Place 2 Pump onto the skin daily.   losartan-hydrochlorothiazide (HYZAAR) 50-12.5 MG tablet Take 1 tablet by mouth daily.   Testosterone (ANDROGEL) 20.25 MG/1.25GM (1.62%) GEL Place 2 Pump onto the skin daily.   No facility-administered encounter medications on file as of 08/29/2022.    Surgical History: Past Surgical History:  Procedure Laterality Date   COLONOSCOPY WITH PROPOFOL N/A 05/18/2020   Procedure: COLONOSCOPY WITH PROPOFOL;  Surgeon: Toney Reil, MD;  Location: [facility] ENDOSCOPY;  Service: Gastroenterology;  Laterality: N/A;   NO PAST SURGERIES      Medical History: Past Medical History:  Diagnosis Date   Eczema    ED (erectile dysfunction)    Gout    Gynecomastia, male left   History of pyelonephritis Jan 2017   Hyperlipidemia    Hypertension    Polycystic kidney disease    Snoring     Family History: Family History  Problem Relation Age of Onset   Hypertension Mother    Kidney disease Father    Hypertension Father    Dementia Father    Squamous cell carcinoma Father    Cancer Father  SCC   Diabetes Father    Heart disease Father    Cancer Brother        hodgkin's lymphoma   Heart disease Brother 74       3 vessel CABG   Heart disease Maternal Grandmother    Stroke Maternal Grandmother    Heart disease Maternal Grandfather    Stroke Maternal Grandfather    Heart disease Paternal Grandmother    Heart disease Paternal Grandfather     Social History   Socioeconomic History   Marital status: Married    Spouse name: Not on file   Number of children: Not on file   Years of education: Not on file   Highest education level: Not on file  Occupational History   Not on file   Tobacco Use   Smoking status: Never   Smokeless tobacco: Current    Types: Chew  Vaping Use   Vaping Use: Never used  Substance and Sexual Activity   Alcohol use: No   Drug use: No   Sexual activity: Yes  Other Topics Concern   Not on file  Social History Narrative   Not on file   Social Determinants of Health   Financial Resource Strain: Not on file  Food Insecurity: Not on file  Transportation Needs: Not on file  Physical Activity: Not on file  Stress: Not on file  Social Connections: Not on file  Intimate Partner Violence: Not on file      Review of Systems  Constitutional:  Negative for chills, fatigue and unexpected weight change.  HENT:  Negative for congestion, rhinorrhea, sneezing and sore throat.   Eyes:  Negative for redness.  Respiratory:  Negative for cough, chest tightness and shortness of breath.   Cardiovascular:  Negative for chest pain and palpitations.  Gastrointestinal:  Negative for abdominal pain, constipation, diarrhea, nausea and vomiting.  Genitourinary:  Negative for dysuria and frequency.  Musculoskeletal:  Negative for arthralgias, back pain, joint swelling and neck pain.  Skin:  Negative for rash.  Neurological: Negative.  Negative for tremors and numbness.  Hematological:  Negative for adenopathy. Does not bruise/bleed easily.  Psychiatric/Behavioral:  Negative for behavioral problems (Depression), sleep disturbance and suicidal ideas. The patient is not nervous/anxious.     Vital Signs: BP 118/80   Pulse 69   Temp 98.4 F (36.9 C)   Resp 16   Ht 6\' 1"  (1.854 m)   Wt 222 lb 6.4 oz (100.9 kg)   SpO2 98%   BMI 29.34 kg/m    Physical Exam Vitals and nursing note reviewed.  Constitutional:      General: He is not in acute distress.    Appearance: Normal appearance. He is well-developed. He is not diaphoretic.  HENT:     Head: Normocephalic and atraumatic.     Mouth/Throat:     Pharynx: No oropharyngeal exudate.  Eyes:      Pupils: Pupils are equal, round, and reactive to light.  Neck:     Thyroid: No thyromegaly.     Vascular: No JVD.     Trachea: No tracheal deviation.  Cardiovascular:     Rate and Rhythm: Normal rate and regular rhythm.     Heart sounds: Normal heart sounds. No murmur heard.    No friction rub. No gallop.  Pulmonary:     Effort: Pulmonary effort is normal. No respiratory distress.     Breath sounds: No wheezing or rales.  Chest:     Chest wall: No  tenderness.  Abdominal:     General: Bowel sounds are normal.     Palpations: Abdomen is soft.  Musculoskeletal:        General: Normal range of motion.     Cervical back: Normal range of motion and neck supple.  Lymphadenopathy:     Cervical: No cervical adenopathy.  Skin:    General: Skin is warm and dry.  Neurological:     Mental Status: He is alert and oriented to person, place, and time.     Cranial Nerves: No cranial nerve deficit.  Psychiatric:        Behavior: Behavior normal.        Thought Content: Thought content normal.        Judgment: Judgment normal.        Assessment/Plan: 1. Short-term memory loss Will refer to neurology for further evaluation - Ambulatory referral to Neurology  2. Pituitary adenoma (HCC) - Ambulatory referral to Neurology  3. Renal cell carcinoma of left kidney (HCC) Will check labs while pt awaits getting established with nephrology/urology - Basic Metabolic Panel (BMET)  4. Testosterone deficiency Will send refill until patient gets in with specialist - Testosterone (ANDROGEL) 20.25 MG/1.25GM (1.62%) GEL; Place 2 Pump onto the skin daily.  Dispense: 88 g; Refill: 3  5. Benign hypertension - losartan-hydrochlorothiazide (HYZAAR) 50-12.5 MG tablet; Take 1 tablet by mouth daily.  Dispense: 90 tablet; Refill: 1  6. B12 deficiency - B12 and Folate Panel   General Counseling: Isaia verbalizes understanding of the findings of todays visit and agrees with plan of treatment. I have  discussed any further diagnostic evaluation that may be needed or ordered today. We also reviewed his medications today. he has been encouraged to call the office with any questions or concerns that should arise related to todays visit.    Orders Placed This Encounter  Procedures   Basic Metabolic Panel (BMET)   B12 and Folate Panel   Ambulatory referral to Neurology    Meds ordered this encounter  Medications   Testosterone (ANDROGEL) 20.25 MG/1.25GM (1.62%) GEL    Sig: Place 2 Pump onto the skin daily.    Dispense:  88 g    Refill:  3   tadalafil (CIALIS) 20 MG tablet    Sig: Take 1 tablet (20 mg total) by mouth daily as needed for erectile dysfunction.    Dispense:  30 tablet    Refill:  3   losartan-hydrochlorothiazide (HYZAAR) 50-12.5 MG tablet    Sig: Take 1 tablet by mouth daily.    Dispense:  90 tablet    Refill:  1    This patient was seen by Lynn Ito, PA-C in collaboration with Dr. Beverely Risen as a part of collaborative care agreement.   Total time spent:30 Minutes Time spent includes review of chart, medications, test results, and follow up plan with the patient.      Dr Lyndon Code Internal medicine

## 2022-08-29 NOTE — Telephone Encounter (Signed)
Awaiting 08/29/22 office notes for Neurology referral-Toni

## 2022-08-30 ENCOUNTER — Telehealth: Payer: Self-pay | Admitting: Physician Assistant

## 2022-08-30 LAB — BASIC METABOLIC PANEL
BUN: 23 mg/dL (ref 6–24)
Glucose: 93 mg/dL (ref 70–99)
Potassium: 3.8 mmol/L (ref 3.5–5.2)

## 2022-08-30 NOTE — Telephone Encounter (Signed)
Neurology referral faxed to @ [facility] per patient's request; [phone]

## 2022-08-31 ENCOUNTER — Telehealth: Payer: Self-pay

## 2022-08-31 LAB — BASIC METABOLIC PANEL
BUN/Creatinine Ratio: 15 (ref 9–20)
CO2: 26 mmol/L (ref 20–29)
Calcium: 10.1 mg/dL (ref 8.7–10.2)
Chloride: 102 mmol/L (ref 96–106)
Creatinine, Ser: 1.54 mg/dL — ABNORMAL HIGH (ref 0.76–1.27)
Sodium: 144 mmol/L (ref 134–144)
eGFR: 54 mL/min/{1.73_m2} — ABNORMAL LOW (ref >59)

## 2022-08-31 LAB — B12 AND FOLATE PANEL
Folate: 13.5 ng/mL (ref >3.0)
Vitamin B-12: 492 pg/mL (ref 232–1245)

## 2022-08-31 NOTE — Telephone Encounter (Signed)
Pt  wife notified labs result

## 2022-08-31 NOTE — Telephone Encounter (Signed)
-----   Message from Carlean Jews, PA-C sent at 08/31/2022 12:35 PM EDT ----- Please let pt know that creatinine is elevated and GFR is reduced, but is stable from previous labs in March from outside facility. Otherwise labs are normal

## 2022-09-19 DIAGNOSIS — E230 Hypopituitarism: Secondary | ICD-10-CM | POA: Diagnosis not present

## 2022-09-19 DIAGNOSIS — D352 Benign neoplasm of pituitary gland: Secondary | ICD-10-CM | POA: Diagnosis not present

## 2022-09-19 DIAGNOSIS — E221 Hyperprolactinemia: Secondary | ICD-10-CM | POA: Diagnosis not present

## 2022-09-19 DIAGNOSIS — E039 Hypothyroidism, unspecified: Secondary | ICD-10-CM | POA: Diagnosis not present

## 2022-10-10 DIAGNOSIS — N182 Chronic kidney disease, stage 2 (mild): Secondary | ICD-10-CM | POA: Diagnosis not present

## 2022-10-10 DIAGNOSIS — C642 Malignant neoplasm of left kidney, except renal pelvis: Secondary | ICD-10-CM | POA: Diagnosis not present

## 2022-11-08 DIAGNOSIS — L538 Other specified erythematous conditions: Secondary | ICD-10-CM | POA: Diagnosis not present

## 2022-11-08 DIAGNOSIS — L820 Inflamed seborrheic keratosis: Secondary | ICD-10-CM | POA: Diagnosis not present

## 2022-11-08 DIAGNOSIS — D2262 Melanocytic nevi of left upper limb, including shoulder: Secondary | ICD-10-CM | POA: Diagnosis not present

## 2022-11-08 DIAGNOSIS — Z85828 Personal history of other malignant neoplasm of skin: Secondary | ICD-10-CM | POA: Diagnosis not present

## 2022-11-08 DIAGNOSIS — L570 Actinic keratosis: Secondary | ICD-10-CM | POA: Diagnosis not present

## 2022-11-08 DIAGNOSIS — D2261 Melanocytic nevi of right upper limb, including shoulder: Secondary | ICD-10-CM | POA: Diagnosis not present

## 2022-11-08 DIAGNOSIS — Z808 Family history of malignant neoplasm of other organs or systems: Secondary | ICD-10-CM | POA: Diagnosis not present

## 2022-11-09 ENCOUNTER — Other Ambulatory Visit: Payer: Self-pay

## 2022-11-09 DIAGNOSIS — Q613 Polycystic kidney, unspecified: Secondary | ICD-10-CM

## 2022-11-09 MED ORDER — POTASSIUM CITRATE ER 5 MEQ (540 MG) PO TBCR
5.0000 meq | EXTENDED_RELEASE_TABLET | Freq: Every day | ORAL | 0 refills | Status: DC
Start: 2022-11-09 — End: 2022-12-04

## 2022-11-10 ENCOUNTER — Other Ambulatory Visit: Payer: Self-pay | Admitting: Physician Assistant

## 2022-11-10 DIAGNOSIS — Q613 Polycystic kidney, unspecified: Secondary | ICD-10-CM

## 2022-11-10 DIAGNOSIS — I10 Essential (primary) hypertension: Secondary | ICD-10-CM | POA: Diagnosis not present

## 2022-11-10 DIAGNOSIS — M1000 Idiopathic gout, unspecified site: Secondary | ICD-10-CM | POA: Diagnosis not present

## 2022-11-10 DIAGNOSIS — R8290 Unspecified abnormal findings in urine: Secondary | ICD-10-CM | POA: Diagnosis not present

## 2022-11-10 DIAGNOSIS — E785 Hyperlipidemia, unspecified: Secondary | ICD-10-CM | POA: Diagnosis not present

## 2022-11-16 ENCOUNTER — Other Ambulatory Visit: Payer: Self-pay | Admitting: Nephrology

## 2022-11-16 DIAGNOSIS — N281 Cyst of kidney, acquired: Secondary | ICD-10-CM

## 2022-11-16 DIAGNOSIS — C642 Malignant neoplasm of left kidney, except renal pelvis: Secondary | ICD-10-CM

## 2022-11-16 DIAGNOSIS — N1831 Chronic kidney disease, stage 3a: Secondary | ICD-10-CM

## 2022-11-16 DIAGNOSIS — R8290 Unspecified abnormal findings in urine: Secondary | ICD-10-CM

## 2022-11-16 DIAGNOSIS — Z905 Acquired absence of kidney: Secondary | ICD-10-CM

## 2022-11-23 ENCOUNTER — Ambulatory Visit
Admission: RE | Admit: 2022-11-23 | Discharge: 2022-11-23 | Disposition: A | Discharge status: Home / Self Care | Payer: BC Managed Care – PPO | Source: Ambulatory Visit | Attending: Nephrology | Admitting: Nephrology

## 2022-11-23 DIAGNOSIS — N281 Cyst of kidney, acquired: Secondary | ICD-10-CM

## 2022-11-23 DIAGNOSIS — C642 Malignant neoplasm of left kidney, except renal pelvis: Secondary | ICD-10-CM

## 2022-11-23 DIAGNOSIS — N1831 Chronic kidney disease, stage 3a: Secondary | ICD-10-CM

## 2022-11-23 DIAGNOSIS — R8290 Unspecified abnormal findings in urine: Secondary | ICD-10-CM | POA: Diagnosis not present

## 2022-11-23 DIAGNOSIS — N189 Chronic kidney disease, unspecified: Secondary | ICD-10-CM | POA: Diagnosis not present

## 2022-11-23 DIAGNOSIS — Z905 Acquired absence of kidney: Secondary | ICD-10-CM

## 2022-12-04 ENCOUNTER — Other Ambulatory Visit: Payer: Self-pay | Admitting: Physician Assistant

## 2022-12-04 DIAGNOSIS — Q613 Polycystic kidney, unspecified: Secondary | ICD-10-CM

## 2022-12-05 MED ORDER — POTASSIUM CITRATE ER 5 MEQ (540 MG) PO TBCR: 5.0000 meq | EXTENDED_RELEASE_TABLET | Freq: Every day | ORAL | 0 refills | Status: DC

## 2022-12-06 ENCOUNTER — Other Ambulatory Visit: Payer: Self-pay | Admitting: Physician Assistant

## 2022-12-06 DIAGNOSIS — Q613 Polycystic kidney, unspecified: Secondary | ICD-10-CM

## 2022-12-08 ENCOUNTER — Telehealth: Payer: Self-pay | Admitting: Physician Assistant

## 2022-12-08 NOTE — Telephone Encounter (Signed)
Patient left vm during lunch. I lvm to return my call-Toni

## 2022-12-12 ENCOUNTER — Ambulatory Visit: Payer: BC Managed Care – PPO | Admitting: Nurse Practitioner

## 2022-12-12 ENCOUNTER — Encounter: Payer: Self-pay | Admitting: Nurse Practitioner

## 2022-12-12 VITALS — BP 150/96 | HR 79 | Temp 98.9°F | Resp 16 | Ht 73.0 in | Wt 233.4 lb

## 2022-12-12 DIAGNOSIS — J3089 Other allergic rhinitis: Secondary | ICD-10-CM

## 2022-12-12 DIAGNOSIS — Q613 Polycystic kidney, unspecified: Secondary | ICD-10-CM

## 2022-12-12 DIAGNOSIS — I151 Hypertension secondary to other renal disorders: Secondary | ICD-10-CM

## 2022-12-12 DIAGNOSIS — N522 Drug-induced erectile dysfunction: Secondary | ICD-10-CM | POA: Diagnosis not present

## 2022-12-12 DIAGNOSIS — I10 Essential (primary) hypertension: Secondary | ICD-10-CM

## 2022-12-12 MED ORDER — LEVOCETIRIZINE DIHYDROCHLORIDE 5 MG PO TABS
5.0000 mg | ORAL_TABLET | Freq: Every evening | ORAL | 1 refills | Status: DC
Start: 2022-12-12 — End: 2023-06-05

## 2022-12-12 MED ORDER — MONTELUKAST SODIUM 10 MG PO TABS
10.0000 mg | ORAL_TABLET | Freq: Every day | ORAL | 3 refills | Status: AC
Start: 2022-12-12 — End: ?

## 2022-12-12 MED ORDER — TADALAFIL 20 MG PO TABS
20.0000 mg | ORAL_TABLET | Freq: Every day | ORAL | 1 refills | Status: DC | PRN
Start: 2022-12-12 — End: 2023-05-18

## 2022-12-12 MED ORDER — AMLODIPINE BESYLATE 10 MG PO TABS
ORAL_TABLET | ORAL | 1 refills | Status: AC
Start: 2022-12-12 — End: ?

## 2022-12-12 MED ORDER — OLMESARTAN MEDOXOMIL-HCTZ 40-12.5 MG PO TABS
1.0000 | ORAL_TABLET | Freq: Every day | ORAL | 1 refills | Status: AC
Start: 2022-12-12 — End: ?

## 2022-12-12 MED ORDER — POTASSIUM CITRATE ER 5 MEQ (540 MG) PO TBCR
5.0000 meq | EXTENDED_RELEASE_TABLET | Freq: Every day | ORAL | 1 refills | Status: DC
Start: 2022-12-12 — End: 2023-05-18

## 2022-12-12 NOTE — Progress Notes (Signed)
[facility] [address] [place], [place] [address]  Internal MEDICINE  Office Visit Note  Patient Name: David [name]  [id number]  [id number]  Date of Service: 12/12/2022  Chief Complaint  Patient presents with   Acute Visit    Bp running high     HPI Gunnar presents for an acute sick visit for BP running higher than patient's baseline.  Currently patient is taking amlodipine 10 mg daily and losartan-hydrochlorothiazide 50-12.5 mg daily.  Reviewed Home BP readings which are elevated BP is elevated today as well.  Also requesting a few routine medication refills.    Current Medication:  Outpatient Encounter Medications as of 12/12/2022  Medication Sig   cabergoline (DOSTINEX) 0.5 MG tablet Take 0.25 mg by mouth 2 (two) times a week.   levothyroxine (SYNTHROID) 25 MCG tablet Take 25 mcg by mouth daily before breakfast. Patient taking 0.375 mg daily   olmesartan-hydrochlorothiazide (BENICAR HCT) 40-12.5 MG tablet Take 1 tablet by mouth daily.   Testosterone (ANDROGEL) 20.25 MG/1.25GM (1.62%) GEL Place 2 Pump onto the skin daily.   [DISCONTINUED] amLODipine (NORVASC) 10 MG tablet TAKE 1 TABLET(10 MG) BY MOUTH DAILY   [DISCONTINUED] levocetirizine (XYZAL) 5 MG tablet Take 5 mg by mouth every evening.   [DISCONTINUED] losartan-hydrochlorothiazide (HYZAAR) 50-12.5 MG tablet Take 1 tablet by mouth daily.   [DISCONTINUED] montelukast (SINGULAIR) 10 MG tablet Take 1 tablet (10 mg total) by mouth at bedtime.   [DISCONTINUED] potassium citrate (UROCIT-K) 5 MEQ (540 MG) SR tablet Take 1 tablet (5 mEq total) by mouth daily.   [DISCONTINUED] tadalafil (CIALIS) 20 MG tablet Take 1 tablet (20 mg total) by mouth daily as needed for erectile dysfunction.   amLODipine (NORVASC) 10 MG tablet TAKE 1 TABLET(10 MG) BY MOUTH DAILY   levocetirizine (XYZAL) 5 MG tablet Take 1 tablet (5 mg total) by mouth every evening.   montelukast (SINGULAIR) 10 MG tablet Take 1 tablet (10 mg total) by mouth  at bedtime.   potassium citrate (UROCIT-K) 5 MEQ (540 MG) SR tablet Take 1 tablet (5 mEq total) by mouth daily.   tadalafil (CIALIS) 20 MG tablet Take 1 tablet (20 mg total) by mouth daily as needed for erectile dysfunction.   No facility-administered encounter medications on file as of 12/12/2022.      Medical History: Past Medical History:  Diagnosis Date   Eczema    ED (erectile dysfunction)    Gout    Gynecomastia, male left   History of pyelonephritis Jan 2017   Hyperlipidemia    Hypertension    Polycystic kidney disease    Snoring      Vital Signs: BP (!) 150/96   Pulse 79   Temp 98.9 F (37.2 C)   Resp 16   Ht 6\' 1"  (1.854 m)   Wt 233 lb 6.4 oz (105.9 kg)   SpO2 97%   BMI 30.79 kg/m    Review of Systems  Constitutional:  Negative for chills, fatigue and unexpected weight change.  HENT:  Negative for congestion, rhinorrhea, sneezing and sore throat.   Eyes:  Negative for redness.  Respiratory:  Negative for cough, chest tightness and shortness of breath.   Cardiovascular:  Negative for chest pain and palpitations.  Gastrointestinal:  Negative for abdominal pain, constipation, diarrhea, nausea and vomiting.  Genitourinary:  Negative for dysuria and frequency.  Musculoskeletal:  Negative for arthralgias, back pain, joint swelling and neck pain.  Skin:  Negative for rash.  Neurological: Negative.  Negative for tremors  and numbness.  Hematological:  Negative for adenopathy. Does not bruise/bleed easily.  Psychiatric/Behavioral:  Negative for behavioral problems (Depression), sleep disturbance and suicidal ideas. The patient is not nervous/anxious.     Physical Exam Vitals reviewed.  Constitutional:      General: He is not in acute distress.    Appearance: Normal appearance. He is obese. He is not ill-appearing.  HENT:     Head: Normocephalic and atraumatic.  Eyes:     Pupils: Pupils are equal, round, and reactive to light.  Cardiovascular:     Rate and  Rhythm: Normal rate and regular rhythm.  Pulmonary:     Effort: Pulmonary effort is normal. No respiratory distress.  Neurological:     Mental Status: He is alert and oriented to person, place, and time.  Psychiatric:        Mood and Affect: Mood normal.        Behavior: Behavior normal.       Assessment/Plan: 1. Hypertension secondary to other renal disorders Losartan-hydrochlorothiazide discontinued. Start olmesartan-hydrochlorothiazide 40-12.5 mg daily. Continue amlodipine as prescribed, refills ordered  - amLODipine (NORVASC) 10 MG tablet; TAKE 1 TABLET(10 MG) BY MOUTH DAILY  Dispense: 90 tablet; Refill: 1 - olmesartan-hydrochlorothiazide (BENICAR HCT) 40-12.5 MG tablet; Take 1 tablet by mouth daily.  Dispense: 90 tablet; Refill: 1  2. Polycystic kidney disease Medication refills ordered for potassium supplement.  - potassium citrate (UROCIT-K) 5 MEQ (540 MG) SR tablet; Take 1 tablet (5 mEq total) by mouth daily.  Dispense: 90 tablet; Refill: 1  3. Drug-induced erectile dysfunction Take tadalafil daily as needed for ED.  - tadalafil (CIALIS) 20 MG tablet; Take 1 tablet (20 mg total) by mouth daily as needed for erectile dysfunction.  Dispense: 90 tablet; Refill: 1  4. Non-seasonal allergic rhinitis due to other allergic trigger Continue montelukast and levocetirizine as prescribed, refills ordered  - levocetirizine (XYZAL) 5 MG tablet; Take 1 tablet (5 mg total) by mouth every evening.  Dispense: 90 tablet; Refill: 1 - montelukast (SINGULAIR) 10 MG tablet; Take 1 tablet (10 mg total) by mouth at bedtime.  Dispense: 90 tablet; Refill: 3   General Counseling: Karmelo verbalizes understanding of the findings of todays visit and agrees with plan of treatment. I have discussed any further diagnostic evaluation that may be needed or ordered today. We also reviewed his medications today. he has been encouraged to call the office with any questions or concerns that should arise related to  todays visit.    Counseling:    No orders of the defined types were placed in this encounter.   Meds ordered this encounter  Medications   amLODipine (NORVASC) 10 MG tablet    Sig: TAKE 1 TABLET(10 MG) BY MOUTH DAILY    Dispense:  90 tablet    Refill:  1   levocetirizine (XYZAL) 5 MG tablet    Sig: Take 1 tablet (5 mg total) by mouth every evening.    Dispense:  90 tablet    Refill:  1   potassium citrate (UROCIT-K) 5 MEQ (540 MG) SR tablet    Sig: Take 1 tablet (5 mEq total) by mouth daily.    Dispense:  90 tablet    Refill:  1    Pt needs appt for further refills   montelukast (SINGULAIR) 10 MG tablet    Sig: Take 1 tablet (10 mg total) by mouth at bedtime.    Dispense:  90 tablet    Refill:  3   tadalafil (  CIALIS) 20 MG tablet    Sig: Take 1 tablet (20 mg total) by mouth daily as needed for erectile dysfunction.    Dispense:  90 tablet    Refill:  1   olmesartan-hydrochlorothiazide (BENICAR HCT) 40-12.5 MG tablet    Sig: Take 1 tablet by mouth daily.    Dispense:  90 tablet    Refill:  1    New medication, fill script today, discontinue losartan-hydrochlorothiazide now.    Return for f/u in 3-4 weeks with lauren for BP check on new med olmesartan-hctz .  [place] Controlled Substance Database was reviewed by me for overdose risk score (ORS)  Time spent:30 Minutes Time spent with patient included reviewing progress notes, labs, imaging studies, and discussing plan for follow up.   This patient was seen by Sallyanne Kuster, FNP-C in collaboration with Dr. Beverely Risen as a part of collaborative care agreement.  Linette Gunderson R. Tedd Sias, MSN, FNP-C Internal Medicine

## 2022-12-15 ENCOUNTER — Encounter: Payer: Self-pay | Admitting: Physician Assistant

## 2022-12-16 ENCOUNTER — Other Ambulatory Visit: Payer: Self-pay | Admitting: Physician Assistant

## 2022-12-16 DIAGNOSIS — I151 Hypertension secondary to other renal disorders: Secondary | ICD-10-CM

## 2023-01-07 ENCOUNTER — Encounter: Payer: Self-pay | Admitting: Nurse Practitioner

## 2023-01-12 ENCOUNTER — Encounter: Payer: Self-pay | Admitting: Physician Assistant

## 2023-01-13 ENCOUNTER — Other Ambulatory Visit: Payer: Self-pay

## 2023-01-13 DIAGNOSIS — I151 Hypertension secondary to other renal disorders: Secondary | ICD-10-CM

## 2023-01-13 MED ORDER — AMLODIPINE BESYLATE 10 MG PO TABS: ORAL_TABLET | ORAL | 1 refills | Status: DC

## 2023-01-19 ENCOUNTER — Encounter: Payer: Self-pay | Admitting: Physician Assistant

## 2023-01-19 ENCOUNTER — Ambulatory Visit: Payer: BC Managed Care – PPO | Admitting: Physician Assistant

## 2023-01-19 VITALS — BP 126/88 | HR 73 | Temp 98.4°F | Resp 16 | Ht 73.0 in | Wt 236.2 lb

## 2023-01-19 DIAGNOSIS — G4710 Hypersomnia, unspecified: Secondary | ICD-10-CM | POA: Diagnosis not present

## 2023-01-19 DIAGNOSIS — I151 Hypertension secondary to other renal disorders: Secondary | ICD-10-CM | POA: Diagnosis not present

## 2023-01-19 DIAGNOSIS — E66811 Obesity, class 1: Secondary | ICD-10-CM

## 2023-01-19 MED ORDER — LOSARTAN POTASSIUM-HCTZ 50-12.5 MG PO TABS
1.0000 | ORAL_TABLET | Freq: Two times a day (BID) | ORAL | 1 refills | Status: DC
Start: 2023-01-19 — End: 2023-05-18

## 2023-01-19 NOTE — Progress Notes (Signed)
[facility] [address] [place], [place] [address]  Internal MEDICINE  Office Visit Note  Patient Name: David [name]  [id number]  [id number]  Date of Service: 01/24/2023  Chief Complaint  Patient presents with   Follow-up   Hypertension   Hyperlipidemia    HPI Pt is here for routine follow up -BP much better since changing to BID hyzaar and would like to continue with this dosing rather than increase dose once daily. AT home BP 116-138/71-85 -Having some daytime sleepiness, snoring, and headaches and his wife has asked about having a sleep study done and will order now, especially with need for increase BP meds recently. Wife has also noticed from possible restless legs, but pt denies any sleep disturbance from this specifically.  -goes back to nephrology Oct, should be having new labs at this time - he is unsure when endocrinology appt is. He has an appt in Dec, but is not sure which office this is for  Current Medication: Outpatient Encounter Medications as of 01/19/2023  Medication Sig   amLODipine (NORVASC) 10 MG tablet TAKE 1 TABLET(10 MG) BY MOUTH DAILY   cabergoline (DOSTINEX) 0.5 MG tablet Take 0.25 mg by mouth 2 (two) times a week.   levocetirizine (XYZAL) 5 MG tablet Take 1 tablet (5 mg total) by mouth every evening.   levothyroxine (SYNTHROID) 25 MCG tablet Take 25 mcg by mouth daily before breakfast. Patient taking 0.375 mg daily   losartan-hydrochlorothiazide (HYZAAR) 50-12.5 MG tablet Take 1 tablet by mouth 2 (two) times daily.   montelukast (SINGULAIR) 10 MG tablet Take 1 tablet (10 mg total) by mouth at bedtime.   potassium citrate (UROCIT-K) 5 MEQ (540 MG) SR tablet Take 1 tablet (5 mEq total) by mouth daily.   tadalafil (CIALIS) 20 MG tablet Take 1 tablet (20 mg total) by mouth daily as needed for erectile dysfunction.   Testosterone (ANDROGEL) 20.25 MG/1.25GM (1.62%) GEL Place 2 Pump onto the skin daily.   [DISCONTINUED]  olmesartan-hydrochlorothiazide (BENICAR HCT) 40-12.5 MG tablet Take 1 tablet by mouth daily. (Patient taking differently: Take 1 tablet by mouth 2 times daily at 12 noon and 4 pm.)   No facility-administered encounter medications on file as of 01/19/2023.    Surgical History: Past Surgical History:  Procedure Laterality Date   COLONOSCOPY WITH PROPOFOL N/A 05/18/2020   Procedure: COLONOSCOPY WITH PROPOFOL;  Surgeon: Toney Reil, MD;  Location: [facility] ENDOSCOPY;  Service: Gastroenterology;  Laterality: N/A;   NO PAST SURGERIES      Medical History: Past Medical History:  Diagnosis Date   Eczema    ED (erectile dysfunction)    Gout    Gynecomastia, male left   History of pyelonephritis Jan 2017   Hyperlipidemia    Hypertension    Polycystic kidney disease    Snoring     Family History: Family History  Problem Relation Age of Onset   Hypertension Mother    Kidney disease Father    Hypertension Father    Dementia Father    Squamous cell carcinoma Father    Cancer Father        SCC   Diabetes Father    Heart disease Father    Cancer Brother        hodgkin's lymphoma   Heart disease Brother 22       3 vessel CABG   Heart disease Maternal Grandmother    Stroke Maternal Grandmother    Heart disease Maternal Grandfather    Stroke  Maternal Grandfather    Heart disease Paternal Grandmother    Heart disease Paternal Grandfather     Social History   Socioeconomic History   Marital status: Married    Spouse name: Not on file   Number of children: Not on file   Years of education: Not on file   Highest education level: Not on file  Occupational History   Not on file  Tobacco Use   Smoking status: Never   Smokeless tobacco: Current    Types: Chew  Vaping Use   Vaping status: Never Used  Substance and Sexual Activity   Alcohol use: No   Drug use: No   Sexual activity: Yes  Other Topics Concern   Not on file  Social History Narrative   Not on file    Social Determinants of Health   Financial Resource Strain: Low Risk  (09/15/2022)   Received from [facility], [facility]   Overall Financial Resource Strain (CARDIA)    Difficulty of Paying Living Expenses: Not hard at all  Food Insecurity: No Food Insecurity (09/15/2022)   Received from [facility], [facility]   Hunger Vital Sign    Worried About Running Out of Food in the Last Year: Never true    Ran Out of Food in the Last Year: Never true  Transportation Needs: No Transportation Needs (09/15/2022)   Received from [facility], [facility]   [facility] - Transportation    In the past 12 months, has lack of transportation kept you from medical appointments or from getting medications?: No    Lack of Transportation (Non-Medical): No  Physical Activity: Not on file  Stress: Not on file  Social Connections: Not on file  Intimate Partner Violence: Not on file      Review of Systems  Constitutional:  Negative for chills, fatigue and unexpected weight change.  HENT:  Negative for congestion, rhinorrhea, sneezing and sore throat.   Eyes:  Negative for redness.  Respiratory:  Negative for cough, chest tightness and shortness of breath.   Cardiovascular:  Negative for chest pain and palpitations.  Gastrointestinal:  Negative for abdominal pain, constipation, diarrhea, nausea and vomiting.  Genitourinary:  Negative for dysuria and frequency.  Musculoskeletal:  Negative for arthralgias, back pain, joint swelling and neck pain.  Skin:  Negative for rash.  Neurological: Negative.  Negative for tremors and numbness.  Hematological:  Negative for adenopathy. Does not bruise/bleed easily.  Psychiatric/Behavioral:  Negative for behavioral problems (Depression), sleep disturbance and suicidal ideas. The patient is not nervous/anxious.     Vital Signs: BP 126/88 Comment: 120/90  Pulse  73   Temp 98.4 F (36.9 C)   Resp 16   Ht 6\' 1"  (1.854 m)   Wt 236 lb 3.2 oz (107.1 kg)   SpO2 98%   BMI 31.16 kg/m    Physical Exam Vitals reviewed.  Constitutional:      General: He is not in acute distress.    Appearance: Normal appearance. He is obese. He is not ill-appearing.  HENT:     Head: Normocephalic and atraumatic.  Eyes:     Pupils: Pupils are equal, round, and reactive to light.  Cardiovascular:     Rate and Rhythm: Normal rate and regular rhythm.  Pulmonary:     Effort: Pulmonary effort is normal. No respiratory distress.  Skin:    General: Skin is warm and dry.  Neurological:  Mental Status: He is alert and oriented to person, place, and time.  Psychiatric:        Mood and Affect: Mood normal.        Behavior: Behavior normal.        Assessment/Plan: 1. Hypertension secondary to other renal disorders Will order Hyzaar for BID as he has been doing better with this and will continue to monitor - losartan-hydrochlorothiazide (HYZAAR) 50-12.5 MG tablet; Take 1 tablet by mouth 2 (two) times daily.  Dispense: 180 tablet; Refill: 1 - PSG SLEEP STUDY; Future  2. Hypersomnia Due to snoring, daytime sleepiness, headaches, HTN, and elevated BMI will order PSG to evaluate for OSA. - [facility] SLEEP STUDY; Future  3. Obesity (BMI 30.0-34.9) Will order PSG for further evaluation - [facility] SLEEP STUDY; Future   General Counseling: Kareem verbalizes understanding of the findings of todays visit and agrees with plan of treatment. I have discussed any further diagnostic evaluation that may be needed or ordered today. We also reviewed his medications today. he has been encouraged to call the office with any questions or concerns that should arise related to todays visit.    Orders Placed This Encounter  Procedures   PSG SLEEP STUDY    Meds ordered this encounter  Medications   losartan-hydrochlorothiazide (HYZAAR) 50-12.5 MG tablet    Sig: Take 1 tablet by mouth 2  (two) times daily.    Dispense:  180 tablet    Refill:  1    This patient was seen by Lynn Ito, PA-C in collaboration with Dr. Beverely Risen as a part of collaborative care agreement.   Total time spent:30 Minutes Time spent includes review of chart, medications, test results, and follow up plan with the patient.      Dr Lyndon Code Internal medicine

## 2023-01-31 DIAGNOSIS — H524 Presbyopia: Secondary | ICD-10-CM | POA: Diagnosis not present

## 2023-02-06 ENCOUNTER — Telehealth: Payer: Self-pay | Admitting: Physician Assistant

## 2023-02-06 NOTE — Telephone Encounter (Signed)
SS order faxed to [facility]; [phone]. Missed at 01/19/23 check out-[facility]oni

## 2023-02-09 DIAGNOSIS — Z905 Acquired absence of kidney: Secondary | ICD-10-CM | POA: Diagnosis not present

## 2023-02-09 DIAGNOSIS — C642 Malignant neoplasm of left kidney, except renal pelvis: Secondary | ICD-10-CM | POA: Diagnosis not present

## 2023-02-27 ENCOUNTER — Telehealth: Payer: Self-pay | Admitting: Physician Assistant

## 2023-02-27 NOTE — Telephone Encounter (Signed)
SS appointment 03/10/2023 @ [facility]

## 2023-03-03 ENCOUNTER — Encounter: Payer: Self-pay | Admitting: Internal Medicine

## 2023-03-03 DIAGNOSIS — G4733 Obstructive sleep apnea (adult) (pediatric): Secondary | ICD-10-CM

## 2023-03-04 ENCOUNTER — Other Ambulatory Visit: Payer: Self-pay | Admitting: Physician Assistant

## 2023-03-04 DIAGNOSIS — I151 Hypertension secondary to other renal disorders: Secondary | ICD-10-CM

## 2023-03-06 NOTE — Telephone Encounter (Signed)
Please review

## 2023-03-07 DIAGNOSIS — C642 Malignant neoplasm of left kidney, except renal pelvis: Secondary | ICD-10-CM | POA: Diagnosis not present

## 2023-03-20 DIAGNOSIS — E230 Hypopituitarism: Secondary | ICD-10-CM | POA: Diagnosis not present

## 2023-03-22 ENCOUNTER — Other Ambulatory Visit: Payer: Self-pay | Admitting: Physician Assistant

## 2023-03-22 ENCOUNTER — Encounter: Payer: Self-pay | Admitting: Physician Assistant

## 2023-03-22 DIAGNOSIS — E349 Endocrine disorder, unspecified: Secondary | ICD-10-CM

## 2023-03-23 ENCOUNTER — Other Ambulatory Visit: Payer: Self-pay | Admitting: Physician Assistant

## 2023-03-23 ENCOUNTER — Telehealth: Payer: Self-pay

## 2023-03-23 DIAGNOSIS — Q613 Polycystic kidney, unspecified: Secondary | ICD-10-CM | POA: Diagnosis not present

## 2023-03-23 DIAGNOSIS — C642 Malignant neoplasm of left kidney, except renal pelvis: Secondary | ICD-10-CM

## 2023-03-23 DIAGNOSIS — E349 Endocrine disorder, unspecified: Secondary | ICD-10-CM | POA: Diagnosis not present

## 2023-03-23 NOTE — Telephone Encounter (Signed)
Faxed a request for patient's Androgel refill to patient's endocrinologist since we no longer refill this.

## 2023-03-24 ENCOUNTER — Encounter (INDEPENDENT_AMBULATORY_CARE_PROVIDER_SITE_OTHER): Payer: BC Managed Care – PPO | Admitting: Internal Medicine

## 2023-03-24 DIAGNOSIS — G4719 Other hypersomnia: Secondary | ICD-10-CM | POA: Diagnosis not present

## 2023-03-24 LAB — CBC WITH DIFFERENTIAL/PLATELET
Basophils Absolute: 0.1 10*3/uL (ref 0.0–0.2)
Basos: 1 %
EOS (ABSOLUTE): 0.1 10*3/uL (ref 0.0–0.4)
Eos: 2 %
Hematocrit: 47.2 % (ref 37.5–51.0)
Hemoglobin: 15.5 g/dL (ref 13.0–17.7)
Immature Grans (Abs): 0 10*3/uL (ref 0.0–0.1)
Immature Granulocytes: 1 %
Lymphocytes Absolute: 1.7 10*3/uL (ref 0.7–3.1)
Lymphs: 28 %
MCH: 29 pg (ref 26.6–33.0)
MCHC: 32.8 g/dL (ref 31.5–35.7)
MCV: 88 fL (ref 79–97)
Monocytes Absolute: 0.5 10*3/uL (ref 0.1–0.9)
Monocytes: 9 %
Neutrophils Absolute: 3.7 10*3/uL (ref 1.4–7.0)
Neutrophils: 59 %
Platelets: 189 10*3/uL (ref 150–450)
RBC: 5.35 x10E6/uL (ref 4.14–5.80)
RDW: 12.9 % (ref 11.6–15.4)
WBC: 6.2 10*3/uL (ref 3.4–10.8)

## 2023-03-24 LAB — COMPREHENSIVE METABOLIC PANEL
ALT: 22 [IU]/L (ref 0–44)
AST: 18 [IU]/L (ref 0–40)
Albumin: 4.7 g/dL (ref 3.8–4.9)
Alkaline Phosphatase: 99 [IU]/L (ref 44–121)
BUN/Creatinine Ratio: 18 (ref 9–20)
BUN: 32 mg/dL — ABNORMAL HIGH (ref 6–24)
Bilirubin Total: 0.2 mg/dL (ref 0.0–1.2)
CO2: 26 mmol/L (ref 20–29)
Calcium: 10.2 mg/dL (ref 8.7–10.2)
Chloride: 101 mmol/L (ref 96–106)
Creatinine, Ser: 1.8 mg/dL — ABNORMAL HIGH (ref 0.76–1.27)
Globulin, Total: 2.7 g/dL (ref 1.5–4.5)
Glucose: 94 mg/dL (ref 70–99)
Potassium: 4.1 mmol/L (ref 3.5–5.2)
Sodium: 143 mmol/L (ref 134–144)
Total Protein: 7.4 g/dL (ref 6.0–8.5)
eGFR: 44 mL/min/{1.73_m2} — ABNORMAL LOW (ref >59)

## 2023-03-28 NOTE — Procedures (Signed)
[facility]  Polysomnogram Report Part I                                                               Phone: [phone] Fax: [phone]  Patient Name: David [name], David [name] Acquisition Number: [id number]  Date of Birth: 1970-10-07 Acquisition Date: 03/24/2023  Referring Physician: Lynn Ito PA-C     History: The patient is a 52 year old  who was referred for evaluation of . Medical History: eczema, ED, gout, history of pyelonephritis, hyperlipidemia, hypertension, polycystic kidney disease, snoring.  Medications: amlodipine, cabergoline, levocetirlzine, levothyroxine,  losartan-hydrochiorothlazide, montelukast, potassium citrate, tadalafill, testosterone.  Procedure: This routine overnight polysomnogram was performed on the [facility] 5 using the standard diagnostic protocol. This included 6 channels of EEG, 2 channels of EOG, chin EMG, bilateral anterior tibialis EMG, nasal/oral thermistor, PTAF (nasal pressure transducer), chest and abdominal wall movements, EKG, and pulse oximetry.  Description: The total recording time was 475.0 minutes. The total sleep time was 443.0 minutes. There were a total of 29.5 minutes of wakefulness after sleep onset for a goodsleep efficiency of 93.3%. The latency to sleep onset was shortat 2.5 minutes. The R sleep onset latency was within normal limits at 84.0 minutes. Sleep parameters, as a percentage of the total sleep time, demonstrated 2.6% of sleep was in N1 sleep, 55.6% N2, 18.3% N3 and 23.5% R sleep. There were a total of 53 arousals for an arousal index of 7.2 arousals per hour of sleep that was normal.  Respiratory monitoring demonstrated   snoring . Only 4 respiratory events were observed. The baseline oxygen saturation during wakefulness was 95%, during NREM sleep averaged 95%, and during REM sleep averaged  96%. The total duration of oxygen < 90% was 23.8 minutes and <80% was 0.0 minutes.  Cardiac monitoring-  significant cardiac rhythm  irregularities.   Periodic limb movement monitoring- did not demonstrate periodic limb movements.   Impression: This routine overnight polysomnogram did not demonstrate significant obstructive sleep apnea with only 4 respiratory events observed.  Sleep efficiency was good and sleep progressed normally through all stages.  Recommendations:     Would recommend weight loss in a patient with a BMI of 31.1.  Under current guidelines, this patient would not qualify for CPAP coverage due to the overall low apnea hypopnea index.     Yevonne Pax, MD, [facility] Diplomate ABMS-Pulmonary, Critical Care and Sleep Medicine  Electronically reviewed and digitally signed  [facility] Polysomnogram Report Part II  Phone: [phone] Fax: [phone]  Patient last name [name] Neck Size    in. Acquisition [phone]  Patient first name David Weight 236.0 lbs. Started 03/24/2023 at 9:20:55 PM  Birth date December 26, 1970 Height 73.0 in. Stopped 03/25/2023 at 5:24:55 AM  Age 51 BMI 31.1 lb/in2 Duration 475.0  Study Type Adult      Robbi Garter. RPSGT / Loura Back   Reviewed by: Valentino Hue. Henke, PhD, ABSM, FAASM Sleep Data: Lights Out: 9:27:25 PM Sleep Onset: 9:29:55 PM  Lights On: 5:22:25 AM Sleep Efficiency: 93.3 %  Total Recording Time: 475.0 min Sleep Latency (from Lights Off) 2.5 min  Total Sleep Time (TST): 443.0 min R Latency (from Sleep Onset): 84.0 min  Sleep Period Time: 471.0 min Total number of awakenings: 28  Wake during sleep: 28.0 min Wake After Sleep Onset (WASO): 29.5 min   Sleep Data:         Arousal Summary: Stage  Latency from lights out (min) Latency from sleep onset (min) Duration (min) % Total Sleep Time  Normal values  N 1 2.5 0.0 11.5 2.6 (5%)  N 2 3.5 1.0 246.5 55.6 (50%)  N 3 15.0 12.5 81.0 18.3 (20%)  R 86.5 84.0 104.0 23.5 (25%)   Number Index  Spontaneous 52 7.0  Apneas & Hypopneas 1 0.1  RERAs 0 0.0       (Apneas & Hypopneas & RERAs)  (1) (0.1)  Limb  Movement 0 0.0  Snore 0 0.0  TOTAL 53 7.2     Respiratory Data:  CA OA MA Apnea Hypopnea* A+ H RERA Total  Number 0 0 0 0 4 4 0 4  Mean Dur (sec) 0.0 0.0 0.0 0.0 13.5 13.5 0.0 13.5  Max Dur (sec) 0.0 0.0 0.0 0.0 15.5 15.5 0.0 15.5  Total Dur (min) 0.0 0.0 0.0 0.0 0.9 0.9 0.0 0.9  % of TST 0.0 0.0 0.0 0.0 0.2 0.2 0.0 0.2  Index (#/h TST) 0.0 0.0 0.0 0.0 0.5 0.5 0.0 0.5  *Hypopneas scored based on 4% or greater desaturation.  Sleep Stage:        REM NREM TST  AHI 0.0 0.7 0.5  RDI 0.0 0.7 0.5           Body Position Data:  Sleep (min) TST (%) REM (min) NREM (min) CA (#) OA (#) MA (#) HYP (#) AHI (#/h) RERA (#) RDI (#/h) Desat (#)  Supine 33.5 7.56 0.0 33.5 0 0 0 3 5.4 0 5.4 21  Non-Supine 409.50 92.44 104.00 305.50 0.00 0.00 0.00 1.00 0.15 0 0.15 34.00  Left: 265.0 59.82 58.0 207.0 0 0 0 0 0.0 0 0.00 19  Prone: 0.6 0.14 0.0 0.6 0 0 0 0 0.0 0 0.00 0  Right: 143.9 32.48 46.0 97.9 0 0 0 1 0.4 0 0.4 15     Snoring: Total number of snoring episodes  0  Total time with snoring    min (   % of sleep)   Oximetry Distribution:             WK REM NREM TOTAL  Average (%)   95 96 95 95  < 90% 2.7 0.0 20.6 23.3  < 80% 0.0 0.0 0.0 0.0  < 70% 0.0 0.0 0.0 0.0  # of Desaturations* 16 3 37 56  Desat Index (#/hour) 35.8 1.7 6.6 7.6  Desat Max (%) 15 5 9 15   Desat Max Dur (sec) 25.0 118.0 104.0 118.0  Approx Min O2 during sleep 79  Approx min O2 during a respiratory event 87  Was Oxygen added (Y/N) and final rate :    LPM  *Desaturations based on 3% or greater drop from baseline.   Cheyne Stokes Breathing: None Present   Heart Rate Summary:  Average Heart Rate During Sleep 47.2 bpm      Highest Heart Rate During Sleep (95th %) 56.0 bpm      Highest Heart Rate During Sleep 157 bpm (artifact)  Highest Heart Rate During Recording (TIB) 216 bpm (artifact)   Heart Rate Observations: Event Type # Events   Bradycardia 0 Lowest HR Scored: N/A  Sinus Tachycardia During  Sleep 0 Highest HR Scored: N/A  Narrow Complex Tachycardia 0 Highest HR Scored: N/A  Wide Complex Tachycardia 0 Highest HR  Scored: N/A  Asystole 0 Longest Pause: N/A  Atrial Fibrillation 0 Duration Longest Event: N/A  Other Arrythmias   Type:    Periodic Limb Movement Data: (Primary legs unless otherwise noted) Total # Limb Movement 0 Limb Movement Index 0.0  Total # PLMS    PLMS Index     Total # PLMS Arousals    PLMS Arousal Index     Percentage Sleep Time with PLMS   min (   % sleep)  Mean Duration limb movements (secs)

## 2023-03-29 ENCOUNTER — Telehealth: Payer: Self-pay

## 2023-03-29 DIAGNOSIS — E230 Hypopituitarism: Secondary | ICD-10-CM | POA: Diagnosis not present

## 2023-03-29 DIAGNOSIS — E221 Hyperprolactinemia: Secondary | ICD-10-CM | POA: Diagnosis not present

## 2023-03-29 DIAGNOSIS — D352 Benign neoplasm of pituitary gland: Secondary | ICD-10-CM | POA: Diagnosis not present

## 2023-03-29 NOTE — Telephone Encounter (Signed)
-----   Message from Carlean Jews sent at 03/28/2023  1:19 PM EST ----- Please let him know that his renal function was worse on recent labs and follow up with nephrology as planned. Otherwise labs look good.

## 2023-03-29 NOTE — Telephone Encounter (Signed)
Spoke with patient regarding lab results. 

## 2023-04-20 DIAGNOSIS — E785 Hyperlipidemia, unspecified: Secondary | ICD-10-CM | POA: Diagnosis not present

## 2023-04-20 DIAGNOSIS — M1000 Idiopathic gout, unspecified site: Secondary | ICD-10-CM | POA: Diagnosis not present

## 2023-04-20 DIAGNOSIS — N281 Cyst of kidney, acquired: Secondary | ICD-10-CM | POA: Diagnosis not present

## 2023-04-20 DIAGNOSIS — R8290 Unspecified abnormal findings in urine: Secondary | ICD-10-CM | POA: Diagnosis not present

## 2023-04-20 DIAGNOSIS — C642 Malignant neoplasm of left kidney, except renal pelvis: Secondary | ICD-10-CM | POA: Diagnosis not present

## 2023-04-20 DIAGNOSIS — I10 Essential (primary) hypertension: Secondary | ICD-10-CM | POA: Diagnosis not present

## 2023-04-20 DIAGNOSIS — N1831 Chronic kidney disease, stage 3a: Secondary | ICD-10-CM | POA: Diagnosis not present

## 2023-04-20 NOTE — Progress Notes (Signed)
 Follow Up Visit   Patient Name: David [name], male   Patient DOB: June 10, 1970 Date of Service: 04/20/2023  Patient MRN: [id number] Provider Creating Note: Pinkey Edman, MD  [phone] Primary Care Physician:   [address] [place] [address] Additional Physicians/ Providers:    History of Present Illness David [name] is a 53 y.o. male now comes to the office for follow-up.  He has a past medical history of hypertension, gout, polycystic kidney disease, hyperlipidemia, renal cell cancer of the left kidney status post left nephrectomy 10/23 and chronic kidney disease now comes for renal follow-up.   He had a CT scan of the abdomen pelvis which did not reveal any evidence of recurrence. The CT scan again confirmed multiple cysts in the right kidney. Denies any use of nonsteroidal anti-inflammatories. His most recent blood work from [facility] shows a creatinine of 1.8 with a GFR of 44 cc.  His previous GFR was 52 cc/min.  Medications   Current Outpatient Medications:  .  montelukast  (SINGULAIR ) 10 MG tablet, Take 1 tablet by mouth every night, Disp: , Rfl:  .  Testosterone  Cypionate 200 MG/ML solution, Inject 200 mg into the shoulder, thigh, or buttocks Every 2 weeks, Disp: , Rfl:  .  acetaminophen  (TYLENOL ) 500 MG tablet, Take 500 mg by mouth if needed for mild pain, Disp: , Rfl:  .  amLODIPine  (NORVASC ) 10 MG tablet, Take 10 mg by mouth in the morning., Disp: , Rfl:  .  cabergoline (DOSTINEX) 0.5 MG tablet, Take 0.5 mg by mouth 2 (two) times a week, Disp: , Rfl:  .  CALCIUM MAGNESIUM ZINC PO, Take by mouth 1 (one) time each day, Disp: , Rfl:  .  levocetirizine (XYZAL ) 5 MG tablet, Take 5 mg by mouth 1 (one) time each day in the evening, Disp: , Rfl:  .  levothyroxine (SYNTHROID, LEVOTHROID) 25 MCG tablet, Take 37.5 mcg by mouth in the morning., Disp: , Rfl:  .  losartan -hydroCHLOROthiazide  (HYZAAR) 50-12.5 MG per tablet, Take 1 tablet by mouth in the morning and 1 tablet in the evening.,  Disp: , Rfl:  .  potassium citrate  (UROCIT-K ) 5 MEQ (540 MG) CR tablet, Take 1 tablet (5 mEq total) by mouth daily, Disp: 30 tablet, Rfl: 3 .  tadalafil  (CIALIS ) 20 MG tablet, Take 20 mg by mouth 1 (one) time each day if needed, Disp: , Rfl:  .  UNABLE TO FIND, daily Med Name: L-Theanine, Disp: , Rfl:    Allergies Patient has no known allergies.  Problem List Patient Active Problem List  Diagnosis  . Chronic kidney disease stage 3A (HCC)  . Idiopathic gout, not otherwise specified  . Hypertension  . Hyperlipidemia, not otherwise specified  . Abnormal urine  . S/P nephrectomy  . Renal cell carcinoma <Left side> (HCC)  . Acquired polycystic kidney disease     Review of Systems  Constitutional: Negative.   HENT: Negative.    Eyes: Negative.   Respiratory: Negative.    Cardiovascular: Negative.   Gastrointestinal: Negative.   Genitourinary: Negative.   Musculoskeletal: Negative.   Skin: Negative.      History Past Medical History:  Diagnosis Date  . Acute kidney failure (HCC)   . Essential hypertension   . Gout   . Other and unspecified hyperlipidemia   . Polycystic kidney, unspecified type     Past Surgical History:  Procedure Laterality Date  . NEPHRECTOMY     Family History  Problem Relation Age of Onset  . Dementia  Father   . Kidney disease Father   . Hypertension Father   . Heart disease Brother   . Cancer Brother    Social History   Tobacco Use  . Smoking status: Former  . Smokeless tobacco: Never  Substance Use Topics  . Alcohol use: No        Physical Exam  Vitals BP 133/85 (BP Location: Right upper arm, Patient Position: Sitting)   Pulse 71   Temp 98.2 F   Wt 244 lb 6.4 oz (111 kg)   SpO2 97%   BMI 31.38 kg/m   PHYSICAL EXAM: General appearance: well developed, well nourished, NAD Neck: Trachea midline; supple Lungs: CTAB, with normal respiratory effort  CV: S1S2, no murmurs or rubs. Abdomen: Soft, non-tender; bowel sounds  present Extremities: No peripheral edema   Laboratory Studies   CBC w/Diff/Platelet Component 03/23/23 <redacted file path> 04/02/20 <redacted file path> 01/11/19 <redacted file path> 04/27/17 <redacted file path> 05/13/15 <redacted file path> 05/10/15 <redacted file path>  WBC 6.2 -- 7.9 <redacted file path> 8.6 <redacted file path> 6.2 <redacted file path> 9.6 <redacted file path>  RBC 5.35 -- 4.64 <redacted file path> 5.28 <redacted file path> 5.17 <redacted file path> 4.78 <redacted file path>  Hemoglobin 15.5 13.2 <redacted file path> 13.6 <redacted file path> 15.1 <redacted file path> 14.5 <redacted file path> 13.7 <redacted file path>  Hematocrit 47.2 40.2 <redacted file path> -- -- -- --  MCV 88 -- 86.6 <redacted file path> 82.2 <redacted file path> 84.3 <redacted file path> 83.8 <redacted file path>  MCH 29 -- 29.3 <redacted file path> 28.6 <redacted file path> 28.1 <redacted file path> 28.7 <redacted file path>  MCHC 32.8 -- 33.8 <redacted file path> 34.8 <redacted file path> 33.3 <redacted file path> 34.3 <redacted file path>  RDW 12.9 -- 13.0 <redacted file path> 12.7 <redacted file path> 13.6 <redacted file path> 13.7 <redacted file path>  Platelets 189 -- 221 <redacted file path> 234 <redacted file path> 208 <redacted file path> 167 <redacted file path>   Comprehensive metabolic panel Component 03/23/23 <redacted file path> 08/29/22 <redacted file path> 07/04/19 <redacted file path> 01/11/19 <redacted file path> 07/30/18 <redacted file path> 07/03/17 <redacted file path>  Glucose 94 93 <redacted file path> -- -- -- --  BUN 32 High  23 <redacted file path> 12 <redacted file path> 17 <redacted file path> 21 <redacted file path> 13 <redacted file path>  Creatinine, Ser 1.8 High  1.54 <redacted file path> High  -- -- -- --  eGFR 44 Low  54 <redacted file path> Low  -- -- -- --  BUN/Creatinine Ratio 18 15 <redacted file path> NOT APPLICABLE <redacted file path> NOT APPLICABLE  <redacted file path> NOT APPLICABLE <redacted file path> NOT APPLICABLE <redacted file path>  Sodium 143 144 <redacted file path> 143 <redacted file path> 144 <redacted file path> 140 <redacted file path> 142 <redacted file path>  Potassium 4.1 3.8 <redacted file path> 3.8 <redacted file path> 3.7 <redacted file path> 3.5 <redacted file path> 3.2 <redacted file path> Low   Chloride 101 102 <redacted file path> 106 <redacted file path> 103 <redacted file path> 103 <redacted file path> 101 <redacted file path>  CO2 26 26 <redacted file path> 33 <redacted file path> High  30 <redacted file path> 29 <redacted file path> 32 <redacted file path>  Calcium 10.2 10.1 <redacted file path> 9.6 <redacted file path> 9.8 <redacted file path> 10.1 <redacted file path> 10.1 <redacted file path>  Total Protein 7.4 -- 6.6 <redacted file path> 6.6 <  redacted file path> 6.9 <redacted file path> --  Albumin 4.7 -- 4.2 <redacted file path> 4.4 <redacted file path> 4.3 <redacted file path> --    Urine  Lab Units 11/10/22 1508 11/10/22 1432  COLOR U  YELLOW  --   COLOR UA   --  Yellow  CLARITY UA   --  Clear  KETONES U MG/DL  NEGATIVE  --   KETONES UA   --  Negative  PH UA   --  6.5  UROBILINOGEN UA   --  0.2  PROT/CREAT RATIO UR mg/g creat 0.103  103  --     Imaging and Other Studies  CT abdomen without and CT abdomen and pelvis with IV contrast   Comparison:  None available   Indication:  s/p nephrectomy. eval for mets, renal cell carcinoma, C64.2  Malignant neoplasm of left kidney, except renal pelvis (CMS/HHS-HCC).   Technique:  CT imaging of the abdomen and pelvis was performed. Imaging was  performed through the kidneys before the administration of IV contrast,  followed by arterial phase imaging through the liver, and portal venous  phase imaging of the abdomen and pelvis after the administration of  contrast.  Iodinated contrast was used due to the indications for the  examination, to improve  disease detection and to further define anatomy.   Coronal and sagittal reformatted images were generated and reviewed.   Findings:  - Lower Thorax: No suspicious pulmonary abnormalities. No pleural or  pericardial effusions. Dependent atelectasis.   - Liver: Normal in morphology and enhancement.  There are few scattered  subcentimeter hypoattenuating lesions which are too small to characterize.  No suspicious enhancing lesion. The portal and hepatic veins are patent.   - Biliary and Gallbladder: No intrahepatic or extrahepatic bile duct  dilatation. The gallbladder is unremarkable.   - Spleen: Normal in appearance.    - Pancreas: Normal in appearance.   - Adrenal Glands: Normal in appearance.   - Kidneys: Postsurgical changes from prior total left nephrectomy. No  suspicious soft tissue nodularity or enhancement within the operative site.  Multiple right renal cysts and other hypodensities which are too small to  characterize. No suspicious renal lesions. No hydronephrosis.   - Abdominal and Pelvic Vasculature: No abdominal aortic aneurysm.   - Gastrointestinal Tract: No abnormal dilation or wall thickening.   - Peritoneum/Mesentery/Retroperitoneum: No free fluid.  No free  intraperitoneal air.   - Lymph Nodes: No retroperitoneal, mesenteric, pelvic, or inguinal  lymphadenopathy.    - Bladder: Normal in appearance.   - Pelvic Organs: Unremarkable.   - Body Wall: Unremarkable.   - Musculoskeletal:  No aggressive appearing osseous lesions.    Impression:  No evidence of local residual or metastatic disease in the abdomen or  pelvis.   Electronically Reviewed by:  Morene Moats, MD, [facility]  Electronically Reviewed on:  02/09/2023 2:31 PM   I have reviewed the images and concur with the above findings.   Electronically Signed by:  Redell Dawn, MD, [facility]  Electronically Signed on:  02/09/2023 4:21 PM   Problem List Items Addressed This Visit      Chronic kidney disease stage 3A (HCC) - Primary   Idiopathic gout, not otherwise specified   Hypertension   Hyperlipidemia, not otherwise specified   Abnormal urine   S/P nephrectomy   Renal cell carcinoma <Left side> (HCC)   Acquired polycystic kidney disease   Orders Placed This Encounter  . Renal Function Panel  . Urinalysis  .  Protein, Total, Random Urine w/Creatinine (Protein/Creat Ratio)        Impression/Recommendations   David [name] is a 53 y.o. male with past medical history of hypertension, gout, polycystic kidney disease, hyperlipidemia, renal cell cancer of the left kidney status post radical nephrectomy and chronic kidney disease now sent for renal evaluation.   #1: Chronic kidney: Patient is chronic kidney disease stage  3A with a GFR of 45 cc/min.  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  Will continue the losartan  for long-term cardiorenal protection.  Renal sonogram shows left nephrectomy and the right kidney normal size with multiple cysts.   #2: Hypertension: I advised him on importance of 2 g salt restricted diet.  Will continue the amlodipine  and losartan  along with hydrochlorothiazide .   #3: Polycystic kidney disease: I advised him on importance of diet.  Will continue the ARB to decrease intraglomerular pressure.  Advised him on complications of PKD leading to end-stage renal disease, cyst rupture nephrolithiasis.  Will continue the potassium citrate  at the present dose.   #4: Anemia: Patient has normal hemoglobin levels.   #5: Secondary hyperparathyroidism: His PTH, calcium and phosphorus levels are within normal limits.   Patient is advised to avoid nonsteroidal anti-inflammatories. Dietary advice given to the patient. I answered all his questions to his satisfaction. Will repeat blood work today. Dear Dr. Kristina, thank you for the opportunity to participate in the care of this very pleasant  patient.   I discussed the assessment and treatment plan with the patient.  The patient was provided an opportunity to ask questions and all were answered.  The patient agreed with the plan and demonstrated an understanding of the instructions. The patient was advised to call back or seek an in-person evaluation if the symptoms worsen or if the condition fails to improve as anticipated.  Return in about 3 months (around 07/19/2023).  Disclaimer: Much of the narrative of this dictation was acquired using speech recognition software. It is possible that some dictated speech was not transcribed accurately by this system nor detected in the proofing. Such errors are at times unavoidable.    Pinkey Edman, MD [facility] Ph: [phone] Fax: [phone] 04/20/2023

## 2023-05-18 ENCOUNTER — Ambulatory Visit (INDEPENDENT_AMBULATORY_CARE_PROVIDER_SITE_OTHER): Payer: BC Managed Care – PPO | Admitting: Physician Assistant

## 2023-05-18 ENCOUNTER — Encounter: Payer: Self-pay | Admitting: Physician Assistant

## 2023-05-18 VITALS — BP 115/83 | HR 60 | Temp 98.2°F | Resp 16 | Ht 73.0 in | Wt 244.6 lb

## 2023-05-18 DIAGNOSIS — Q613 Polycystic kidney, unspecified: Secondary | ICD-10-CM | POA: Diagnosis not present

## 2023-05-18 DIAGNOSIS — N522 Drug-induced erectile dysfunction: Secondary | ICD-10-CM

## 2023-05-18 DIAGNOSIS — E66811 Obesity, class 1: Secondary | ICD-10-CM | POA: Diagnosis not present

## 2023-05-18 DIAGNOSIS — Z0001 Encounter for general adult medical examination with abnormal findings: Secondary | ICD-10-CM | POA: Diagnosis not present

## 2023-05-18 DIAGNOSIS — I151 Hypertension secondary to other renal disorders: Secondary | ICD-10-CM | POA: Diagnosis not present

## 2023-05-18 MED ORDER — ZEPBOUND 2.5 MG/0.5ML ~~LOC~~ SOAJ: 2.5000 mg | SUBCUTANEOUS | 2 refills | Status: AC

## 2023-05-18 MED ORDER — AMLODIPINE BESYLATE 10 MG PO TABS: ORAL_TABLET | ORAL | 1 refills | Status: AC

## 2023-05-18 MED ORDER — LOSARTAN POTASSIUM-HCTZ 50-12.5 MG PO TABS: 1.0000 | ORAL_TABLET | Freq: Two times a day (BID) | ORAL | 1 refills | Status: DC

## 2023-05-18 MED ORDER — POTASSIUM CITRATE ER 5 MEQ (540 MG) PO TBCR: 5.0000 meq | EXTENDED_RELEASE_TABLET | Freq: Every day | ORAL | 1 refills | Status: DC

## 2023-05-18 MED ORDER — TADALAFIL 20 MG PO TABS: 20.0000 mg | ORAL_TABLET | Freq: Every day | ORAL | 1 refills | Status: AC | PRN

## 2023-05-18 NOTE — Progress Notes (Signed)
[facility] [address] [place], [place] [address]  Internal MEDICINE  Office Visit Note  Patient Name: David [name]  [id number]  [id number]  Date of Service: 05/28/2023  Chief Complaint  Patient presents with   Annual Exam   Hypertension   Hyperlipidemia     HPI Pt is here for routine health maintenance examination -doing well, no acute concerns today -BP well controlled -UTD on colonoscopy, due in 2027 -due for shingles vaccines -labs with other providers typically, followed by nephrology and endo -interested in zepbound for wt loss, will see if covered. No personal or Fhx of thyroid cancer.  Current Medication: Outpatient Encounter Medications as of 05/18/2023  Medication Sig   amLODipine (NORVASC) 10 MG tablet TAKE 1 TABLET(10 MG) BY MOUTH DAILY   cabergoline (DOSTINEX) 0.5 MG tablet Take 0.25 mg by mouth 2 (two) times a week.   lansoprazole (PREVACID) 15 MG capsule Take 15 mg by mouth 2 (two) times daily before a meal.   levocetirizine (XYZAL) 5 MG tablet Take 1 tablet (5 mg total) by mouth every evening.   levothyroxine (SYNTHROID) 25 MCG tablet Take 25 mcg by mouth daily before breakfast. Patient taking 1.5 tabs of the .25   losartan-hydrochlorothiazide (HYZAAR) 50-12.5 MG tablet Take 1 tablet by mouth 2 (two) times daily.   montelukast (SINGULAIR) 10 MG tablet Take 1 tablet (10 mg total) by mouth at bedtime.   potassium citrate (UROCIT-K) 5 MEQ (540 MG) SR tablet Take 1 tablet (5 mEq total) by mouth daily.   tadalafil (CIALIS) 20 MG tablet Take 1 tablet (20 mg total) by mouth daily as needed for erectile dysfunction.   Testosterone (ANDROGEL) 20.25 MG/1.25GM (1.62%) GEL Place 2 Pump onto the skin daily.   testosterone cypionate (DEPOTESTOTERONE CYPIONATE) 100 MG/ML injection Inject 200 mg into the muscle every 14 (fourteen) days. For IM use only   tirzepatide (ZEPBOUND) 2.5 MG/0.5ML Pen Inject 2.5 mg into the skin once a week.   [DISCONTINUED]  amLODipine (NORVASC) 10 MG tablet TAKE 1 TABLET(10 MG) BY MOUTH DAILY   [DISCONTINUED] losartan-hydrochlorothiazide (HYZAAR) 50-12.5 MG tablet Take 1 tablet by mouth 2 (two) times daily.   [DISCONTINUED] potassium citrate (UROCIT-K) 5 MEQ (540 MG) SR tablet Take 1 tablet (5 mEq total) by mouth daily.   [DISCONTINUED] tadalafil (CIALIS) 20 MG tablet Take 1 tablet (20 mg total) by mouth daily as needed for erectile dysfunction.   No facility-administered encounter medications on file as of 05/18/2023.    Surgical History: Past Surgical History:  Procedure Laterality Date   COLONOSCOPY WITH PROPOFOL N/A 05/18/2020   Procedure: COLONOSCOPY WITH PROPOFOL;  Surgeon: Toney Reil, MD;  Location: [facility] ENDOSCOPY;  Service: Gastroenterology;  Laterality: N/A;   NO PAST SURGERIES      Medical History: Past Medical History:  Diagnosis Date   Eczema    ED (erectile dysfunction)    Gout    Gynecomastia, male left   History of pyelonephritis Jan 2017   Hyperlipidemia    Hypertension    Polycystic kidney disease    Snoring     Family History: Family History  Problem Relation Age of Onset   Hypertension Mother    Kidney disease Father    Hypertension Father    Dementia Father    Squamous cell carcinoma Father    Cancer Father        SCC   Diabetes Father    Heart disease Father    Cancer Brother  hodgkin's lymphoma   Heart disease Brother 77       3 vessel CABG   Heart disease Maternal Grandmother    Stroke Maternal Grandmother    Heart disease Maternal Grandfather    Stroke Maternal Grandfather    Heart disease Paternal Grandmother    Heart disease Paternal Grandfather       Review of Systems  Constitutional:  Negative for chills, fatigue and unexpected weight change.  HENT:  Negative for congestion, rhinorrhea, sneezing and sore throat.   Eyes:  Negative for redness.  Respiratory:  Negative for cough, chest tightness and shortness of breath.   Cardiovascular:   Negative for chest pain and palpitations.  Gastrointestinal:  Negative for abdominal pain, constipation, diarrhea, nausea and vomiting.  Genitourinary:  Negative for dysuria and frequency.  Musculoskeletal:  Negative for arthralgias, back pain, joint swelling and neck pain.  Skin:  Negative for rash.  Neurological: Negative.  Negative for tremors and numbness.  Hematological:  Negative for adenopathy. Does not bruise/bleed easily.  Psychiatric/Behavioral:  Negative for behavioral problems (Depression), sleep disturbance and suicidal ideas. The patient is not nervous/anxious.      Vital Signs: BP 115/83   Pulse 60   Temp 98.2 F (36.8 C)   Resp 16   Ht 6\' 1"  (1.854 m)   Wt 244 lb 9.6 oz (110.9 kg)   SpO2 97%   BMI 32.27 kg/m    Physical Exam Vitals and nursing note reviewed.  Constitutional:      General: He is not in acute distress.    Appearance: Normal appearance. He is obese. He is not ill-appearing.  HENT:     Head: Normocephalic and atraumatic.  Eyes:     Pupils: Pupils are equal, round, and reactive to light.  Cardiovascular:     Rate and Rhythm: Normal rate and regular rhythm.  Pulmonary:     Effort: Pulmonary effort is normal. No respiratory distress.  Abdominal:     Tenderness: There is no abdominal tenderness.  Skin:    General: Skin is warm and dry.  Neurological:     Mental Status: He is alert and oriented to person, place, and time.  Psychiatric:        Mood and Affect: Mood normal.        Behavior: Behavior normal.      LABS: Recent Results (from the past 2160 hours)  CBC w/Diff/Platelet     Status: None   Collection Time: 03/23/23  1:57 PM  Result Value Ref Range   WBC 6.2 3.4 - 10.8 x10E3/uL   RBC 5.35 4.14 - 5.80 x10E6/uL   Hemoglobin 15.5 13.0 - 17.7 g/dL   Hematocrit 40.9 81.1 - 51.0 %   MCV 88 79 - 97 fL   MCH 29.0 26.6 - 33.0 pg   MCHC 32.8 31.5 - 35.7 g/dL   RDW 91.4 78.2 - 95.6 %   Platelets 189 150 - 450 x10E3/uL   Neutrophils 59  Not Estab. %   Lymphs 28 Not Estab. %   Monocytes 9 Not Estab. %   Eos 2 Not Estab. %   Basos 1 Not Estab. %   Neutrophils Absolute 3.7 1.4 - 7.0 x10E3/uL   Lymphocytes Absolute 1.7 0.7 - 3.1 x10E3/uL   Monocytes Absolute 0.5 0.1 - 0.9 x10E3/uL   EOS (ABSOLUTE) 0.1 0.0 - 0.4 x10E3/uL   Basophils Absolute 0.1 0.0 - 0.2 x10E3/uL   Immature Granulocytes 1 Not Estab. %   Immature Grans (Abs) 0.0 0.0 -  0.1 x10E3/uL  Comprehensive metabolic panel     Status: Abnormal   Collection Time: 03/23/23  1:57 PM  Result Value Ref Range   Glucose 94 70 - 99 mg/dL   BUN 32 (H) 6 - 24 mg/dL   Creatinine, Ser 2.95 (H) 0.76 - 1.27 mg/dL   eGFR 44 (L) >62 ZH/YQM/5.78   BUN/Creatinine Ratio 18 9 - 20   Sodium 143 134 - 144 mmol/L   Potassium 4.1 3.5 - 5.2 mmol/L   Chloride 101 96 - 106 mmol/L   CO2 26 20 - 29 mmol/L   Calcium 10.2 8.7 - 10.2 mg/dL   Total Protein 7.4 6.0 - 8.5 g/dL   Albumin 4.7 3.8 - 4.9 g/dL   Globulin, Total 2.7 1.5 - 4.5 g/dL   Bilirubin Total 0.2 0.0 - 1.2 mg/dL   Alkaline Phosphatase 99 44 - 121 IU/L   AST 18 0 - 40 IU/L   ALT 22 0 - 44 IU/L        Assessment/Plan: 1. Encounter for general adult medical examination with abnormal findings (Primary) CPE performed, labs done by outside providers  2. Hypertension secondary to other renal disorders Well controlled, continue current medications - amLODipine (NORVASC) 10 MG tablet; TAKE 1 TABLET(10 MG) BY MOUTH DAILY  Dispense: 90 tablet; Refill: 1 - losartan-hydrochlorothiazide (HYZAAR) 50-12.5 MG tablet; Take 1 tablet by mouth 2 (two) times daily.  Dispense: 180 tablet; Refill: 1 - tirzepatide (ZEPBOUND) 2.5 MG/0.5ML Pen; Inject 2.5 mg into the skin once a week.  Dispense: 2 mL; Refill: 2  3. Polycystic kidney disease Followed by nephrology - potassium citrate (UROCIT-K) 5 MEQ (540 MG) SR tablet; Take 1 tablet (5 mEq total) by mouth daily.  Dispense: 90 tablet; Refill: 1  4. Drug-induced erectile dysfunction -  tadalafil (CIALIS) 20 MG tablet; Take 1 tablet (20 mg total) by mouth daily as needed for erectile dysfunction.  Dispense: 90 tablet; Refill: 1  5. Obesity (BMI 30.0-34.9) Would benefit from zepbound to aid in wt loss - tirzepatide (ZEPBOUND) 2.5 MG/0.5ML Pen; Inject 2.5 mg into the skin once a week.  Dispense: 2 mL; Refill: 2   General Counseling: Daymian verbalizes understanding of the findings of todays visit and agrees with plan of treatment. I have discussed any further diagnostic evaluation that may be needed or ordered today. We also reviewed his medications today. he has been encouraged to call the office with any questions or concerns that should arise related to todays visit.    Counseling:    No orders of the defined types were placed in this encounter.   Meds ordered this encounter  Medications   amLODipine (NORVASC) 10 MG tablet    Sig: TAKE 1 TABLET(10 MG) BY MOUTH DAILY    Dispense:  90 tablet    Refill:  1   losartan-hydrochlorothiazide (HYZAAR) 50-12.5 MG tablet    Sig: Take 1 tablet by mouth 2 (two) times daily.    Dispense:  180 tablet    Refill:  1   potassium citrate (UROCIT-K) 5 MEQ (540 MG) SR tablet    Sig: Take 1 tablet (5 mEq total) by mouth daily.    Dispense:  90 tablet    Refill:  1    Pt needs appt for further refills   tadalafil (CIALIS) 20 MG tablet    Sig: Take 1 tablet (20 mg total) by mouth daily as needed for erectile dysfunction.    Dispense:  90 tablet    Refill:  1  tirzepatide (ZEPBOUND) 2.5 MG/0.5ML Pen    Sig: Inject 2.5 mg into the skin once a week.    Dispense:  2 mL    Refill:  2    This patient was seen by Lynn Ito, PA-C in collaboration with Dr. Beverely Risen as a part of collaborative care agreement.  Total time spent:35 Minutes  Time spent includes review of chart, medications, test results, and follow up plan with the patient.     Lyndon Code, MD  Internal Medicine

## 2023-05-23 ENCOUNTER — Telehealth: Payer: Self-pay

## 2023-05-23 NOTE — Telephone Encounter (Signed)
Completed P.A. for patient's Zepbound. 

## 2023-05-30 NOTE — Procedures (Unsigned)
[facility]  Polysomnogram Report Part I  Phone: [phone] Fax: [phone]  Patient Name: David [name], David [name]. Acquisition Number: [id number]  Date of Birth: [birth year] Acquisition Date: 03/03/2023  Referring Physician: Lynn Ito PA-C     History: The patient is a 53 year old  . Medical History: Eczema, ED, gout, gynecomastia, history of pyelonephritis, hyperlipidemia, hypertension.  Medications: amlodipine, cabergoline, levocetirizine, levothyroxine, losartan-hydrochlorothiazide, montelukast, potassium citrate, tadalafil, testosterone.  Procedure: This routine overnight polysomnogram was performed on the [facility] 5 using the standard CPAP  protocol. This included 6 channels of EEG, 2 channels of EOG, chin EMG, bilateral anterior tibialis EMG, nasal/oral thermistor, PTAF (nasal pressure transducer), chest and abdominal wall movements, EKG, and pulse oximetry.  Description: The total recording time was 395.0 minutes. The total sleep time was 361.5 minutes. There were a total of 27.0 minutes of wakefulness after sleep onset for a slightly reducedsleep efficiency of 91.5%. The latency to sleep onset was shortat 6.5 minutes. The R sleep onset latency was within normal limits at 85.0 minutes. Sleep parameters, as a percentage of the total sleep time, demonstrated 1.7% of sleep was in N1 sleep, 45.9% N2, 31.0% N3 and 21.4% R sleep. There were a total of 27 arousals for an arousal index of 4.5 arousals per hour of sleep that was normal.  Overall, there were a total of 9 respiratory events for a respiratory disturbance index, which includes apneas, hypopneas and RERAs (increased respiratory effort) of 1.5 respiratory events per hour of sleep during the pressure titration.  was initiated at 4.0 cm H2O at lights out, 10:56 p.m. It was titrated in 1 cm increments  for occasional respiratory events and oxygen desaturation to the final pressure of 6 cm H2O. The apnea was controlled at the final  pressure and REM and supine sleep were observed.  Additionally, the baseline oxygen saturation during wakefulness was 95%, during NREM sleep averaged 94%, and during REM sleep averaged 95%. The total duration of oxygen < 90% was 6.2 minutes and <80% was 4.1 minutes.  Cardiac monitoring-  significant cardiac rhythm irregularities.   Periodic limb movement monitoring- did not demonstrate periodic limb movements.   Impression: This patient's obstructive sleep apnea demonstrated significant improvement with the utilization of nasal  at 6 cm H2O.      Recommendations: Would recommend utilization of nasal  at 6 cm H2O.      An AirFit N20 mask, size Medium, was used. Chin strap used during study- No. Humidifier used during study- Yes.     Yevonne Pax, MD, [facility] Diplomate ABMS-Pulmonary, Critical Care and Sleep Medicine  Electronically reviewed and digitally signed  [facility] CPAP/BIPAP Polysomnogram Report Part II Phone: [phone] Fax: [phone]  Patient last name Pettey Neck Size  18.0  in. Acquisition [id number]  Patient first name David [name]. Weight 236.0 lbs. Started 03/03/2023 at 10:52:04 PM  Birth date 1971-03-14 Height 73.0 in. Stopped 03/04/2023 at 5:34:40 AM  Age 53      Type Adult BMI 31.1 lb/in2 Duration 395.0  Robbi Garter. RPSGT. / Loura Back     Reviewed by: Valentino Hue. Henke, PhD, ABSM, FAASM Sleep Data: Lights Out: 10:56:34 PM Sleep Onset: 11:03:04 PM  Lights On: 5:31:34 AM Sleep Efficiency: 91.5 %  Total Recording Time: 395.0 min Sleep Latency (from Lights Off) 6.5 min  Total Sleep Time (TST): 361.5 min R Latency (from Sleep Onset): 85.0 min  Sleep Period Time: 387.5 min Total number of awakenings: 18  Wake  during sleep: 26.0 min Wake After Sleep Onset (WASO): 27.0 min   Sleep Data:         Arousal Summary: Stage  Latency from lights out (min) Latency from sleep onset (min) Duration (min) % Total Sleep Time  Normal values  N 1 31.5 25.0 6.0 1.7  (5%)  N 2 6.5 0.0 166.0 45.9 (50%)  N 3 12.5 6.0 112.0 31.0 (20%)  R 91.5 85.0 77.5 21.4 (25%)   Number Index  Spontaneous 28 4.6  Apneas & Hypopneas 2 0.3  RERAs 0 0.0       (Apneas & Hypopneas & RERAs)  (2) (0.3)  Limb Movement 0 0.0  Snore 0 0.0  TOTAL 30 5.0     Respiratory Data:  CA OA MA Apnea Hypopnea* A+ H RERA Total  Number 1 0 0 1 8 9  0 9  Mean Dur (sec) 13.0 0.0 0.0 13.0 13.3 13.3 0.0 13.3  Max Dur (sec) 13.0 0.0 0.0 13.0 15.0 15.0 0.0 15.0  Total Dur (min) 0.2 0.0 0.0 0.2 1.8 2.0 0.0 2.0  % of TST 0.1 0.0 0.0 0.1 0.5 0.6 0.0 0.6  Index (#/h TST) 0.2 0.0 0.0 0.2 1.3 1.5 0.0 1.5  *Hypopneas scored based on 4% or greater desaturation.  Sleep Stage:         REM NREM TST  AHI 0.0 1.7 1.5  RDI 0.0 1.7 1.5   Sleep (min) TST (%) REM (min) NREM (min) CA (#) OA (#) MA (#) HYP (#) AHI (#/h) RERA (#) RDI (#/h) Desat (#)  Supine 78.5 21.72 0.0 78.5 1 0 0 6 5.4 0 5.4 13  Non-Supine 283.00 78.28 77.50 205.50 0.00 0.00 0.00 2.00 0.42 0 0.42 13.00  Left: 148.0 40.94 16.0 132.0 0 0 0 0 0.0 0 0.00 4  Right: 135.0 37.34 61.5 73.5 0 0 0 2 0.9 0 0.9 9     Snoring: Total number of snoring episodes  0  Total time with snoring    min (   % of sleep)   Oximetry Distribution:             WK REM NREM TOTAL  Average (%)   95 95 94 94  < 90% 0.6 0.0 5.6 6.2  < 80% 0.3 0.0 3.8 4.1  < 70% 0.0 0.0 0.0 0.0  # of Desaturations* 9 1 18 28   Desat Index (#/hour) 16.3 0.8 3.8 4.7  Desat Max (%) 13 3 7 13   Desat Max Dur (sec) 97.0 18.0 74.0 97.0  Approx Min O2 during sleep 75  Approx min O2 during a respiratory event 78  Was Oxygen added (Y/N) and final rate :    LPM  *Desaturations based on 3% or greater drop from baseline.   Cheyne Stokes Breathing: None Present   Heart Rate Summary:  Average Heart Rate During Sleep 50.1 bpm      Highest Heart Rate During Sleep (95th %) 54.0 bpm      Highest Heart Rate During Sleep 189 bpm (artifact)  Highest Heart Rate During  Recording (TIB) 197 bpm (artifact)   Heart Rate Observations: Event Type # Events   Bradycardia 0 Lowest HR Scored: N/A  Sinus Tachycardia During Sleep 0 Highest HR Scored: N/A  Narrow Complex Tachycardia 0 Highest HR Scored: N/A  Wide Complex Tachycardia 0 Highest HR Scored: N/A  Asystole 0 Longest Pause: N/A  Atrial Fibrillation 0 Duration Longest Event: N/A  Other Arrythmias   Type:   Periodic Limb  Movement Data: (Primary legs unless otherwise noted) Total # Limb Movement 0 Limb Movement Index 0.0  Total # PLMS    PLMS Index     Total # PLMS Arousals    PLMS Arousal Index     Percentage Sleep Time with PLMS   min (   % sleep)  Mean Duration limb movements (secs)       IPAP Level (cmH2O) EPAP Level (cmH2O) Total Duration (min) Sleep Duration (min) Sleep (%) REM (%) CA  #) OA # MA # HYP #) AHI (#/hr) RERAs # RERAs (#/hr) RDI (#/hr)  4 4 125.8 118.8 94.4 12.7 1 0 0 2 1.5 0 0.0 1.5  5 5  154.5 149.0 96.4 21.0 0 0 0 6 2.4 0 0.0 2.4  6 6  106.6 93.1 87.3 27.2 0 0 0 0 0.0 0 0.0 0.0

## 2023-05-31 DIAGNOSIS — E230 Hypopituitarism: Secondary | ICD-10-CM | POA: Diagnosis not present

## 2023-05-31 DIAGNOSIS — E221 Hyperprolactinemia: Secondary | ICD-10-CM | POA: Diagnosis not present

## 2023-05-31 DIAGNOSIS — E039 Hypothyroidism, unspecified: Secondary | ICD-10-CM | POA: Diagnosis not present

## 2023-06-05 ENCOUNTER — Other Ambulatory Visit: Payer: Self-pay | Admitting: Nurse Practitioner

## 2023-06-05 DIAGNOSIS — E039 Hypothyroidism, unspecified: Secondary | ICD-10-CM | POA: Diagnosis not present

## 2023-06-05 DIAGNOSIS — J3089 Other allergic rhinitis: Secondary | ICD-10-CM

## 2023-06-05 DIAGNOSIS — E230 Hypopituitarism: Secondary | ICD-10-CM | POA: Diagnosis not present

## 2023-06-05 DIAGNOSIS — E221 Hyperprolactinemia: Secondary | ICD-10-CM | POA: Diagnosis not present

## 2023-06-08 ENCOUNTER — Ambulatory Visit: Payer: BC Managed Care – PPO | Admitting: Physician Assistant

## 2023-06-29 ENCOUNTER — Ambulatory Visit: Payer: BC Managed Care – PPO | Admitting: Physician Assistant

## 2023-07-14 ENCOUNTER — Other Ambulatory Visit: Payer: Self-pay | Admitting: Physician Assistant

## 2023-07-14 DIAGNOSIS — I151 Hypertension secondary to other renal disorders: Secondary | ICD-10-CM

## 2023-07-26 ENCOUNTER — Ambulatory Visit: Payer: BC Managed Care – PPO | Admitting: Dermatology

## 2023-08-15 ENCOUNTER — Ambulatory Visit: Admitting: Dermatology

## 2023-09-02 ENCOUNTER — Encounter: Payer: Self-pay | Admitting: Physician Assistant

## 2023-09-04 ENCOUNTER — Other Ambulatory Visit: Payer: Self-pay | Admitting: Physician Assistant

## 2023-09-04 MED ORDER — ALBUTEROL SULFATE HFA 108 (90 BASE) MCG/ACT IN AERS: 2.0000 | INHALATION_SPRAY | Freq: Four times a day (QID) | RESPIRATORY_TRACT | 0 refills | Status: AC | PRN

## 2023-11-13 ENCOUNTER — Other Ambulatory Visit: Payer: Self-pay | Admitting: Physician Assistant

## 2023-11-13 DIAGNOSIS — Q613 Polycystic kidney, unspecified: Secondary | ICD-10-CM

## 2023-11-22 NOTE — Progress Notes (Addendum)
 HPI: David [name] is a 53 y.o. male who presents for follow up pituitary tumor, elevated IGF-1 , low testosterone  and hypothyroidism (central?). He was a patient of Dr. Standley and was last seen by her on 06/05/2023.  He is accompanied by his wife today who is an administrator, arts at [employer] here in [place] and does sleep medicine.  She gives much of his history.   He was originally found to have low testosterone  in late 2021.  Workup in early 2022 showed an elevated prolactin level and a small pituitary tumor.  He was started on AndroGel  for the low testosterone  and cabergoline for the prolactin/pituitary tumor by Dr. Christi.  He currently takes 0.5 mg of cabergoline twice a week.  He does not have problems with headaches or blind spots in his vision.  They wonder about weaning off the cabergoline since his prolactin has been normal.  He was originally on AndroGel  but had a difficult time keeping his levels normal so Dr. Standley switched him to injections.  He did not like the injections so in 4/25, had pellets implanted at [facility] in [place].  Those weren't covered by insurance so they wonder about other options  He is on 37.5 mg of Levothyroxine (1 and 1/2 25 mg tabs) daily.   He is concerned about his pituitary tumor, elevated IGF-1, and low testosterone     ROS:  No chest pain.  No shortness of breath.   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 He has quit using smokeless tobacco. He reports that he does not drink alcohol.  Married.  Family History: family history includes Cancer in his brother; Dementia in his father; Heart disease in his brother; High blood pressure  (Hypertension) in his father.  Medications: Current Outpatient Medications  Medication Sig Dispense Refill   albuterol  MDI, PROVENTIL , VENTOLIN , PROAIR , HFA 90 mcg/actuation inhaler Inhale 2 inhalations into the lungs every 6 (six) hours as needed for Wheezing     amLODIPine  (NORVASC ) 10 MG tablet Take 10 mg by mouth once daily     cabergoline (DOSTINEX) 0.5 mg tablet Take 1 tablet (0.5 mg total) by mouth twice a week 24 tablet 3   cholecalciferol (VITAMIN D3) 2,000 unit tablet 1 tablet Orally Once a day     famotidine (PEPCID) 20 MG tablet 1 tablet at bedtime as needed Orally Once a day     levocetirizine (XYZAL ) 5 MG tablet Take 5 mg by mouth every evening     levothyroxine (SYNTHROID) 25 MCG tablet Take 1.5 tablets (37.5 mcg total) by mouth once daily 135 tablet 3   losartan -hydroCHLOROthiazide  (HYZAAR) 50-12.5 mg tablet Take 1 tablet by mouth once daily     MAGNESIUM ORAL Take by mouth 220 mg daily     montelukast  (SINGULAIR ) 10 mg tablet Take 10 mg by mouth at bedtime     potassium citrate  (UROCIT-K ) 5 mEq ER tablet Take 1 tablet by mouth once daily     tadalafiL  (CIALIS ) 20 MG tablet Take 1 tablet (20 mg total) by mouth once daily as needed for Erectile Dysfunction 90 tablet 1   UNABLE  TO FIND Med Name: Pellet taken every 6 months     Compound Medication Med Name: enclomid 25 mg once a day 30 each 6   lansoprazole (PREVACID) 15 MG DR capsule Take 15 mg by mouth 2 (two) times daily (Patient not taking: Reported on 11/22/2023)     needle, disp, 18 G 18 gauge x 1 Ndle Use every week to draw up testosterone  (Patient not taking: Reported on 11/22/2023) 10 each 5   syringe with needle 3 mL 23 x 1 Syrg Use every week to draw up testosterone  (Patient not taking: Reported on 11/22/2023) 10 each 5   syringe, disposable, 1 mL syringe Use every week for testosterone  injection (Patient not taking: Reported on 11/22/2023) 10 each 5   testosterone  cypionate (DEPO-TESTOSTERONE ) 200 mg/mL  injection Inject 1 mL (200 mg total) into the muscle every 14 (fourteen) days (Patient not taking: Reported on 11/22/2023) 2 mL 5   ZEPBOUND  2.5 mg/0.5 mL pen injector Inject 2.5 mg subcutaneously once a week (Patient not taking: Reported on 11/22/2023)     No current facility-administered medications for this visit.    Allergies: No Known Allergies  Physical Exam: Vitals:   11/22/23 1531  BP: 116/78  Pulse: 75  SpO2: 97%  Weight: (!) 108.4 kg (239 lb)  Height: 187.3 cm (6' 1.75)   Body mass index is 30.89 kg/m. Gen: WDWN in NAD   Physical exam otherwise deferred due to coronavirus precautions.   Labs: 04/02/2020: Test = 153.  04/30/2020: PRL = 183.  05/06/2020: MRI with 6 mm pituitary tumor on the left.   07/19/2021: Cort (7:14A) = 12.8. FSH = <0.5.  LH = <0.2.  Test = 431.  PRI = 3.9.  IGF1 = 258 (48-209). 10/26/2021: T-score in LS = -0.1, LFN = -1.1, LPF = 0.0.  Frax = 0.2/3.8 10/26/2021: Pituitary MRI with left sided 0.3 cm pituitary tumor/cyst.  05/31/2023: TSH = 0.57.  FT4 = 1.62. PRI = 12.4. Test = 783.1.   5/2/20205 [facility] labs I review on his wife's phone) TSH = .544.  FT4 = .99.  FT3 = 3.3. Anti TPO = <3.  Total T = 3 + 3 (221 - 871).  Free T = 11.8.  PSA = 1.7.  DHEA = 226.9.  Estradiol = <24.  H/H = 16.1/46.8.   08/22/2023: K/Cr/Ca = 3.6/1.54/9.5.   Assessment/Plan: 1.  Pituitary Tumor/Prolactinoma.  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  The tumor measured 6 mm in 1/22 and was down to 3 mm in 7/23.  He has not had an MRI since so I will order one.   He is on cabergoline 0.5 mg twice a week.  They wonder about weaning off.  I told them I would not do this until the tumor is no longer visible on MRI.  If that is the case with his upcoming MRI, we can plan to do it in the future, but I would want to wait until his testosterone  issue is resolved.  I will check prolactin today.  2.  Elevated IGF-1. It was elevated by labs on 4//23.  I will  recheck today.  His tumor could secrete both prolactin and growth hormone.  Hopefully the cabergoline is treating ball  3.  Low Testosterone . His testosterone  in 1/21 was low likely due to his hyperprolactinemia.  He was started on AndroGel  and then switched to injections and finally pellets.  He had pellets placed in 4/25.  I  told them that since his hyperprolactinemia has been fixed, he may not need therapy for his testosterone  at all.  Since he has been on exogenous testosterone  for over 3 years however, we would need to wean his testosterone .  We discussed transitioning him to enclomiphene to help get him off the testosterone . I will start him on Enclomid 25 mg once a day.  I will follow his testosterone  as he gets further out from the pellets that were implanted.  Hopefully the enclomid will keep his testosterone  normal as the pellets wear off.  If that occurs, we can then plan to wean the enclomid.  4. Hypothyroidism. 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 I am skeptical about this. Consider weaning him off thyroid  medicine in the future.  He is currently on LT4 37.5 mcg daily.   5.  CKD.  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  He ssees nephrology and last saw them in 5/25.  6.   He will return to clinic in 6 months. He will have an MRI in the next few weeks.  Total time spent in patient care today 42 minutes.  Most of it was spent in face to face consultation but a significant amount was spent in chart review and some in documentation.   Addendum:  11/22/2023 :  K/Cr/Ca= 3.9/1.5/10.5, eGFR=55.  Prolactin=9.  Testosterone =694.  IGF-1=258 (74-255).  MRI ordered.  Results sent via MyChart  --Pt messaged that enclomid at compouning pharmacy was 94 and with good rx at [facility] cheaper at 65.  Already pd for the compounded so may ask for it to be sent to [facility] once gets close to end and if going to continue.  Ordered repeat testosterone  for 10/25 (which  will be when 6 months out from pellets) --IGF1 mildly elevated again.  Will see what MRI shows and discuss next visit.  12/05/23:  MRI with heterogeneous area of hypoenhancement in the right pituitary gland which is not what was seen on prior MRIs.  That tumor is not seen.  Will follow.    02/09/24:  Pt messaged.  Clomid now cheaper at [facility].  Sent.  02/13/24:  Testosterone =402.  03/22/24:  Pt messaged.  Clomid causing a lot of gas.  Told to cut to Mon, Wed, Fri and we'd see what level is at f/u and consider cutting more.  05/03/24:  Pt messaged.  Had T checked by pcp and low.  Says very fatigued.  Told him to come in and we could recheck and make a plan.  Asked him to bring copies of the labs or have them send.  This note is partially prepared by Earla Daria Messier, Scribe, in the presence of and acting as the scribe of Dr. Debby Breaker , MD.     [facility], MD

## 2023-11-28 ENCOUNTER — Other Ambulatory Visit: Payer: Self-pay | Admitting: "Endocrinology

## 2023-11-28 DIAGNOSIS — D352 Benign neoplasm of pituitary gland: Secondary | ICD-10-CM

## 2023-12-05 ENCOUNTER — Ambulatory Visit
Admission: RE | Admit: 2023-12-05 | Discharge: 2023-12-05 | Disposition: A | Discharge status: Home / Self Care | Source: Ambulatory Visit | Attending: "Endocrinology | Admitting: "Endocrinology

## 2023-12-05 DIAGNOSIS — D352 Benign neoplasm of pituitary gland: Secondary | ICD-10-CM | POA: Diagnosis present

## 2023-12-05 MED ORDER — GADOBUTROL 1 MMOL/ML IV SOLN
10.0000 mL | Freq: Once | INTRAVENOUS | Status: AC | PRN
Start: 2023-12-05 — End: 2023-12-05
  Administered 2023-12-05: 10 mL via INTRAVENOUS

## 2023-12-14 ENCOUNTER — Other Ambulatory Visit: Payer: Self-pay | Admitting: Physician Assistant

## 2023-12-14 DIAGNOSIS — Q613 Polycystic kidney, unspecified: Secondary | ICD-10-CM

## 2024-01-16 ENCOUNTER — Other Ambulatory Visit: Payer: Self-pay | Admitting: Urology

## 2024-01-16 DIAGNOSIS — C642 Malignant neoplasm of left kidney, except renal pelvis: Secondary | ICD-10-CM

## 2024-01-17 ENCOUNTER — Encounter: Payer: Self-pay | Admitting: Urology

## 2024-01-28 ENCOUNTER — Other Ambulatory Visit: Payer: Self-pay | Admitting: Physician Assistant

## 2024-01-28 DIAGNOSIS — Q613 Polycystic kidney, unspecified: Secondary | ICD-10-CM

## 2024-02-01 ENCOUNTER — Ambulatory Visit
Admission: RE | Admit: 2024-02-01 | Discharge: 2024-02-01 | Disposition: A | Discharge status: Home / Self Care | Source: Ambulatory Visit | Attending: Urology | Admitting: Urology

## 2024-02-01 DIAGNOSIS — C642 Malignant neoplasm of left kidney, except renal pelvis: Secondary | ICD-10-CM

## 2024-02-01 MED ORDER — GADOPICLENOL 0.5 MMOL/ML IV SOLN
10.0000 mL | Freq: Once | INTRAVENOUS | Status: AC | PRN
  Administered 2024-02-01: 10 mL via INTRAVENOUS

## 2024-02-22 ENCOUNTER — Telehealth: Payer: Self-pay | Admitting: Internal Medicine

## 2024-02-22 NOTE — Telephone Encounter (Signed)
 Per spouse, patient switched to a pcp in [place]

## 2024-05-06 ENCOUNTER — Telehealth: Payer: Self-pay | Admitting: Internal Medicine

## 2024-05-06 ENCOUNTER — Other Ambulatory Visit: Payer: Self-pay | Admitting: Physician Assistant

## 2024-05-06 DIAGNOSIS — Q613 Polycystic kidney, unspecified: Secondary | ICD-10-CM

## 2024-05-06 NOTE — Telephone Encounter (Signed)
 Per wife, patient now has pcp in [place]

## 2024-05-20 ENCOUNTER — Encounter: Payer: BC Managed Care – PPO | Admitting: Physician Assistant
